# Patient Record
Sex: Male | Born: 1970 | Race: White | Hispanic: No | Smoking: Former smoker
Health system: Southern US, Community
[De-identification: ages and names within clinical notes are randomized; demographics above are authoritative.]

## PROBLEM LIST (undated history)

## (undated) DIAGNOSIS — F419 Anxiety disorder, unspecified: Secondary | ICD-10-CM

## (undated) DIAGNOSIS — J45909 Unspecified asthma, uncomplicated: Secondary | ICD-10-CM

## (undated) DIAGNOSIS — Z8719 Personal history of other diseases of the digestive system: Secondary | ICD-10-CM

## (undated) DIAGNOSIS — K219 Gastro-esophageal reflux disease without esophagitis: Secondary | ICD-10-CM

## (undated) DIAGNOSIS — M1990 Unspecified osteoarthritis, unspecified site: Secondary | ICD-10-CM

## (undated) DIAGNOSIS — T7840XA Allergy, unspecified, initial encounter: Secondary | ICD-10-CM

## (undated) DIAGNOSIS — F32A Depression, unspecified: Secondary | ICD-10-CM

## (undated) HISTORY — DX: Personal history of other diseases of the digestive system: Z87.19

## (undated) HISTORY — DX: Allergy, unspecified, initial encounter: T78.40XA

## (undated) HISTORY — DX: Unspecified osteoarthritis, unspecified site: M19.90

## (undated) HISTORY — DX: Anxiety disorder, unspecified: F41.9

## (undated) HISTORY — DX: Gastro-esophageal reflux disease without esophagitis: K21.9

## (undated) HISTORY — DX: Unspecified asthma, uncomplicated: J45.909

## (undated) HISTORY — DX: Depression, unspecified: F32.A

---
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
## 2003-06-16 HISTORY — PX: HERNIA REPAIR: SHX51

## 2003-12-19 ENCOUNTER — Encounter: Admission: RE | Admit: 2003-12-19 | Discharge: 2003-12-19 | Payer: Self-pay | Admitting: Otolaryngology

## 2010-06-15 HISTORY — PX: KNEE ARTHROSCOPY: SUR90

## 2010-07-05 ENCOUNTER — Encounter: Payer: Self-pay | Admitting: Internal Medicine

## 2013-02-20 ENCOUNTER — Telehealth: Payer: Self-pay | Admitting: Internal Medicine

## 2013-02-20 NOTE — Telephone Encounter (Signed)
Patient is requesting to be your patient, he was referred to see you by a Doctor at the [facility]

## 2013-02-20 NOTE — Telephone Encounter (Signed)
ok 

## 2013-02-21 NOTE — Telephone Encounter (Signed)
Left message for patient to call back and schedule.

## 2013-02-21 NOTE — Telephone Encounter (Signed)
Patient scheduled 04/06/13 @ 9:30, np forms mailed

## 2013-03-03 ENCOUNTER — Ambulatory Visit (INDEPENDENT_AMBULATORY_CARE_PROVIDER_SITE_OTHER): Payer: BC Managed Care – PPO | Admitting: Internal Medicine

## 2013-03-03 ENCOUNTER — Encounter: Payer: Self-pay | Admitting: Internal Medicine

## 2013-03-03 VITALS — BP 120/72 | HR 78 | Temp 98.8°F | Resp 16 | Ht 71.0 in | Wt 186.0 lb

## 2013-03-03 DIAGNOSIS — J209 Acute bronchitis, unspecified: Secondary | ICD-10-CM

## 2013-03-03 DIAGNOSIS — J309 Allergic rhinitis, unspecified: Secondary | ICD-10-CM | POA: Insufficient documentation

## 2013-03-03 DIAGNOSIS — J4530 Mild persistent asthma, uncomplicated: Secondary | ICD-10-CM | POA: Insufficient documentation

## 2013-03-03 DIAGNOSIS — J45901 Unspecified asthma with (acute) exacerbation: Secondary | ICD-10-CM

## 2013-03-03 MED ORDER — MOMETASONE FURO-FORMOTEROL FUM 200-5 MCG/ACT IN AERO: 2.0000 | INHALATION_SPRAY | Freq: Two times a day (BID) | RESPIRATORY_TRACT | Status: DC

## 2013-03-03 MED ORDER — AZITHROMYCIN 500 MG PO TABS: 500.0000 mg | ORAL_TABLET | Freq: Every day | ORAL | Status: DC

## 2013-03-03 MED ORDER — METHYLPREDNISOLONE ACETATE 80 MG/ML IJ SUSP
120.0000 mg | Freq: Once | INTRAMUSCULAR | Status: AC
  Administered 2013-03-03: 120 mg via INTRAMUSCULAR

## 2013-03-03 MED ORDER — HYDROCODONE-HOMATROPINE 5-1.5 MG/5ML PO SYRP: 5.0000 mL | ORAL_SOLUTION | Freq: Three times a day (TID) | ORAL | Status: DC | PRN

## 2013-03-03 NOTE — Patient Instructions (Signed)
Asthma, Adult Asthma is a condition that affects your lungs. It is characterized by swelling and narrowing of your airways as well as increased mucus production. The narrowing comes from swelling and muscle spasms inside the airways. When this happens, breathing can be difficult and you can have coughing, wheezing, and shortness of breath. Knowing more about asthma can help you manage it better. Asthma cannot be cured, but medicines and lifestyle changes can help control it. Asthma can be a minor problem for some people but if it is not controlled it can lead to a life-threatening asthma attack. Asthma can change over time. It is important to work with your caregiver to manage your asthma symptoms. CAUSES The exact cause of asthma is unknown. Asthma is believed to be caused by inherited (genetic) and environmental exposures. Swelling and redness (inflammation) of the airways occurs in asthma. This can be triggered by allergies, viral lung infections, or irritants in the air. Allergic reactions can cause you to wheeze immediately or several hours after an exposure. Asthma triggers are different for each person. It is important to pay attention and know what triggers your asthma.  Common triggers for asthma attacks include:  Animal dander from the skin, hair, or feathers of animals.  Dust mites contained in house dust.  Cockroaches.  Pollen from trees or grass.  Mold.  Cigarette or tobacco smoke. Smoking cannot be allowed in homes of people with asthma. People with asthma should not smoke and should not be around smokers.  Air pollutants such as dust, household cleaners, hair sprays, aerosol sprays, paint fumes, strong chemicals, or strong odors.  Cold air or weather changes. Cold air may cause inflammation. Winds increase molds and pollens in the air. There is not one best climate for people with asthma.  Strong emotions such as crying or laughing hard.  Stress.  Certain medicines such as  aspirin or beta-blockers.  Sulfites in such foods and drinks as dried fruits and wine.  Infections or inflammatory conditions such as the flu, a cold, or an inflammation of the nasal membranes (rhinitis).  Gastroesophageal reflux disease (GERD). GERD is a condition where stomach acid backs up into your throat (esophagus).  Exercise or strenous activity. Proper pre-exercise medicines allow most people to participate in sports. SYMPTOMS  Feeling short of breath.  Chest tightness or pain.  Difficulty sleeping due to coughing, wheezing, or feeling short of breath.  A whistling or wheezing sound with exhalation.  Coughing or wheezing that is worse when you:  Have a virus (such as a cold or the flu).  Are suffering from allergies.  Are exposed to certain fumes or chemicals.  Exercise. Signs that your asthma is probably getting worse include:   More frequent and bothersome asthma signs and symptoms.  Increasing difficulty breathing. This can be measured by a peak flow meter, which is a simple device used to check how well your lungs are working.  An increasingly frequent need to use a quick-relief inhaler. DIAGNOSIS  The diagnosis of asthma is made by review of your medical history, a physical exam, and possibly from other tests. Lung function studies may help with the diagnosis. TREATMENT  Asthma cannot be cured. However, for the majority of adults, asthma can be controlled with treatment. Besides avoidance of triggers of your asthma, medicines are often required. There are 2 classes of medicine used for asthma treatment: controller medicines (reduce inflammation and symptoms) andreliever or rescue medicines (relieve asthma symptoms during acute attacks). You may require daily   medicines to control your asthma. The most effective long-term controller medicines for asthma are inhaled corticosteroids (blocks inflammation). Other long-term control medicines include:  Leukotriene  receptor antagonists (blocks a pathway of inflammation).  Long-acting beta2-agonists (relaxes the muscles of the airways for at least 12 hours) with an inhaled corticosteroid.  Cromolyn sodium or nedocromil (alters certain inflammatory cells' ability to release chemicals that cause inflammation).  Immunomodulators (alters the immune system to prevent asthma symptoms).  Theophylline (relaxes muscles in the airways). You may also require a short-acting beta2-agonist to relieve asthma symptoms during an acute attack. You should understand what to do during an acute attack. Inhaled medicines are effective when used properly. Read the instructions on how to use your medicines correctly and speak to your caregiver if you have questions. Follow up with your caregiver on a regular basis to make sure your asthma is well-controlled. If your asthma is not well-controlled, if you have been hospitalized for asthma, or if multiple medicines or medium to high doses of inhaled corticosteroids are needed to control your asthma, request a referral to an asthma specialist. HOME CARE INSTRUCTIONS   Take medicines as directed by your caregiver.  Control your home environment in the following ways to help prevent asthma attacks:  Change your heating and air conditioning filter at least once a month.  Place a filter or cheesecloth over your heating and air conditioning vents.  Limit the use of fireplaces and wood stoves.  Do not smoke. Do not stay in places where others are smoking.  Get rid of pests (such as roaches and mice) and their droppings.  If you see mold on a plant, throw it away.  Clean your floors and dust every week. Use unscented cleaning products. Use a vacuum cleaner with a HEPA filter if possible. If vacuuming or cleaning triggers your asthma, try to find someone else to do these chores.  Floors in your house should be wood, tile, or vinyl. Carpet can trap dander and dust.  Use  allergy-proof pillows, mattress covers, and box spring covers.  Wash bedsheets and blankets every week in hot water and dry in a dryer.  Use a blanket that is made of polyester or cotton with a tight nap.  Do not use a dust ruffle on your bed.  Clean bathrooms and kitchens with bleach and repaint with mold-resistant paint.  Wash hands frequently.  Talk to your caregiver about an action plan for managing asthma attacks. This includes the use of a peak flow meter which measures the severity of the attack and medicines that can help stop the attack. An action plan can help minimize or stop the attack without having to seek medical care.  Remain calm during an asthma attack.  Always have a plan prepared for seeking medical attention. This should include contacting your caregiver and in the case of a severe attack, calling your local emergency services (911 in [place] SEEK MEDICAL CARE IF:   You have wheezing, shortness of breath, or a cough even if taking medicine to prevent attacks.  You have thickening of sputum.  Your sputum changes from clear or white to yellow, green, gray, or bloody.  You have any problems that may be related to the medicines you are taking (such as a rash, itching, swelling, or trouble breathing).  You are using a reliever medicine more than 2 3 times per week.  Your peak flow is still at 50 79% of personal best after following your action plan for 1   hour. SEEK IMMEDIATE MEDICAL CARE IF:   You are short of breath even at rest.  You get short of breath when doing very little physical activity.  You have difficulty eating, drinking, or talking due to asthma symptoms.  You have chest pain or you feel that your heart is beating fast.  You have a bluish color to your lips or fingernails.  You are lightheaded, dizzy, or faint.  You have a fever or persistent symptoms for more than 2 3 days.  You have a fever and symptoms suddenly get worse.  You seem to be  getting worse and are unresponsive to treatment during an asthma attack.  Your peak flow is less than 50% of personal best. MAKE SURE YOU:   Understand these instructions.  Will watch your condition.  Will get help right away if you are not doing well or get worse. Document Released: 06/01/2005 Document Revised: 05/18/2012 Document Reviewed: 01/18/2008 [employer] Patient Information 2014 [employer].  

## 2013-03-05 ENCOUNTER — Encounter: Payer: Self-pay | Admitting: Internal Medicine

## 2013-03-05 NOTE — Assessment & Plan Note (Signed)
I will treat the infection with a zpak and will control the cough with a suppressant

## 2013-03-05 NOTE — Assessment & Plan Note (Signed)
He was given an injection of depo-medrol to control this flare I have asked him to start using dulera as well

## 2013-03-05 NOTE — Assessment & Plan Note (Signed)
He was given depo-medrol IM to treat this

## 2013-03-05 NOTE — Progress Notes (Signed)
Subjective:    Patient ID: Derek Tucker, male    DOB: 1971-04-02, 42 y.o.   MRN: [id number]  Cough This is a recurrent problem. The current episode started 1 to 4 weeks ago. The problem has been unchanged. The cough is productive of purulent sputum. Associated symptoms include nasal congestion, postnasal drip, rhinorrhea, shortness of breath and wheezing. Pertinent negatives include no chest pain, chills, ear congestion, ear pain, fever, headaches, heartburn, hemoptysis, myalgias, rash, sore throat, sweats or weight loss. He has tried a beta-agonist inhaler for the symptoms. The treatment provided mild relief. His past medical history is significant for asthma.      Review of Systems  Constitutional: Negative.  Negative for fever, chills, weight loss, diaphoresis, activity change, appetite change, fatigue and unexpected weight change.  HENT: Positive for rhinorrhea, sneezing, postnasal drip and sinus pressure. Negative for ear pain, sore throat, facial swelling, trouble swallowing and voice change.   Eyes: Negative.   Respiratory: Positive for cough, shortness of breath, wheezing and stridor. Negative for apnea, hemoptysis, choking and chest tightness.   Cardiovascular: Negative.  Negative for chest pain, palpitations and leg swelling.  Gastrointestinal: Negative.  Negative for heartburn.  Endocrine: Negative.   Genitourinary: Negative.   Musculoskeletal: Negative.  Negative for myalgias.  Skin: Negative.  Negative for rash.  Allergic/Immunologic: Negative.   Neurological: Negative.  Negative for dizziness and headaches.  Hematological: Negative.  Negative for adenopathy. Does not bruise/bleed easily.  Psychiatric/Behavioral: Negative.        Objective:   Physical Exam  Constitutional: He is oriented to person, place, and time. He appears well-developed and well-nourished.  Non-toxic appearance. He does not have a sickly appearance. He does not appear ill. No distress.  HENT:  Head:  Normocephalic and atraumatic.  Right Ear: Hearing, tympanic membrane, external ear and ear canal normal.  Left Ear: Hearing, tympanic membrane, external ear and ear canal normal.  Nose: Mucosal edema and rhinorrhea present. Right sinus exhibits maxillary sinus tenderness. Right sinus exhibits no frontal sinus tenderness. Left sinus exhibits maxillary sinus tenderness. Left sinus exhibits no frontal sinus tenderness.  Mouth/Throat: Oropharynx is clear and moist and mucous membranes are normal. Mucous membranes are not pale, not dry and not cyanotic. No oral lesions. No trismus in the jaw. No edematous. No oropharyngeal exudate, posterior oropharyngeal edema, posterior oropharyngeal erythema or tonsillar abscesses.  Eyes: Conjunctivae are normal. Right eye exhibits no discharge. Left eye exhibits no discharge. No scleral icterus.  Neck: Normal range of motion. Neck supple. No JVD present. No tracheal deviation present. No thyromegaly present.  Cardiovascular: Normal rate, regular rhythm, normal heart sounds and intact distal pulses.  Exam reveals no gallop and no friction rub.   No murmur heard. Pulmonary/Chest: Effort normal and breath sounds normal. No stridor. No respiratory distress. He has no wheezes. He has no rales. He exhibits no tenderness.  Abdominal: Soft. Bowel sounds are normal. He exhibits no distension and no mass. There is no tenderness. There is no rebound and no guarding.  Musculoskeletal: Normal range of motion. He exhibits no edema and no tenderness.  Lymphadenopathy:    He has no cervical adenopathy.  Neurological: He is oriented to person, place, and time.  Skin: Skin is warm and dry. No rash noted. He is not diaphoretic. No erythema. No pallor.  Psychiatric: He has a normal mood and affect. His behavior is normal. Judgment and thought content normal.          Assessment & Plan:

## 2013-04-06 ENCOUNTER — Encounter: Payer: Self-pay | Admitting: Internal Medicine

## 2013-04-06 ENCOUNTER — Ambulatory Visit (INDEPENDENT_AMBULATORY_CARE_PROVIDER_SITE_OTHER): Payer: BC Managed Care – PPO | Admitting: Internal Medicine

## 2013-04-06 VITALS — BP 120/86 | HR 67 | Temp 97.1°F | Resp 16 | Ht 71.0 in | Wt 179.2 lb

## 2013-04-06 DIAGNOSIS — J45909 Unspecified asthma, uncomplicated: Secondary | ICD-10-CM

## 2013-04-06 DIAGNOSIS — Z23 Encounter for immunization: Secondary | ICD-10-CM

## 2013-04-06 DIAGNOSIS — J4530 Mild persistent asthma, uncomplicated: Secondary | ICD-10-CM

## 2013-04-06 DIAGNOSIS — J309 Allergic rhinitis, unspecified: Secondary | ICD-10-CM

## 2013-04-06 MED ORDER — FLUTICASONE PROPIONATE HFA 110 MCG/ACT IN AERO: 1.0000 | INHALATION_SPRAY | Freq: Two times a day (BID) | RESPIRATORY_TRACT | Status: DC

## 2013-04-06 MED ORDER — MONTELUKAST SODIUM 10 MG PO TABS: 10.0000 mg | ORAL_TABLET | Freq: Every day | ORAL | Status: DC

## 2013-04-06 MED ORDER — FLUTICASONE PROPIONATE 50 MCG/ACT NA SUSP: 4.0000 | Freq: Every day | NASAL | Status: AC

## 2013-04-06 MED ORDER — ALBUTEROL SULFATE HFA 108 (90 BASE) MCG/ACT IN AERS: 2.0000 | INHALATION_SPRAY | Freq: Four times a day (QID) | RESPIRATORY_TRACT | Status: DC | PRN

## 2013-04-06 MED ORDER — SPACER/AERO CHAMBER MOUTHPIECE MISC: 1.0000 | Freq: Two times a day (BID) | Status: AC

## 2013-04-06 NOTE — Patient Instructions (Signed)
Asthma, Adult Asthma is a condition that affects your lungs. It is characterized by swelling and narrowing of your airways as well as increased mucus production. The narrowing comes from swelling and muscle spasms inside the airways. When this happens, breathing can be difficult and you can have coughing, wheezing, and shortness of breath. Knowing more about asthma can help you manage it better. Asthma cannot be cured, but medicines and lifestyle changes can help control it. Asthma can be a minor problem for some people but if it is not controlled it can lead to a life-threatening asthma attack. Asthma can change over time. It is important to work with your caregiver to manage your asthma symptoms. CAUSES The exact cause of asthma is unknown. Asthma is believed to be caused by inherited (genetic) and environmental exposures. Swelling and redness (inflammation) of the airways occurs in asthma. This can be triggered by allergies, viral lung infections, or irritants in the air. Allergic reactions can cause you to wheeze immediately or several hours after an exposure. Asthma triggers are different for each person. It is important to pay attention and know what triggers your asthma.  Common triggers for asthma attacks include:  Animal dander from the skin, hair, or feathers of animals.  Dust mites contained in house dust.  Cockroaches.  Pollen from trees or grass.  Mold.  Cigarette or tobacco smoke. Smoking cannot be allowed in homes of people with asthma. People with asthma should not smoke and should not be around smokers.  Air pollutants such as dust, household cleaners, hair sprays, aerosol sprays, paint fumes, strong chemicals, or strong odors.  Cold air or weather changes. Cold air may cause inflammation. Winds increase molds and pollens in the air. There is not one best climate for people with asthma.  Strong emotions such as crying or laughing hard.  Stress.  Certain medicines such as  aspirin or beta-blockers.  Sulfites in such foods and drinks as dried fruits and wine.  Infections or inflammatory conditions such as the flu, a cold, or an inflammation of the nasal membranes (rhinitis).  Gastroesophageal reflux disease (GERD). GERD is a condition where stomach acid backs up into your throat (esophagus).  Exercise or strenous activity. Proper pre-exercise medicines allow most people to participate in sports. SYMPTOMS  Feeling short of breath.  Chest tightness or pain.  Difficulty sleeping due to coughing, wheezing, or feeling short of breath.  A whistling or wheezing sound with exhalation.  Coughing or wheezing that is worse when you:  Have a virus (such as a cold or the flu).  Are suffering from allergies.  Are exposed to certain fumes or chemicals.  Exercise. Signs that your asthma is probably getting worse include:   More frequent and bothersome asthma signs and symptoms.  Increasing difficulty breathing. This can be measured by a peak flow meter, which is a simple device used to check how well your lungs are working.  An increasingly frequent need to use a quick-relief inhaler. DIAGNOSIS  The diagnosis of asthma is made by review of your medical history, a physical exam, and possibly from other tests. Lung function studies may help with the diagnosis. TREATMENT  Asthma cannot be cured. However, for the majority of adults, asthma can be controlled with treatment. Besides avoidance of triggers of your asthma, medicines are often required. There are 2 classes of medicine used for asthma treatment: controller medicines (reduce inflammation and symptoms) andreliever or rescue medicines (relieve asthma symptoms during acute attacks). You may require daily   medicines to control your asthma. The most effective long-term controller medicines for asthma are inhaled corticosteroids (blocks inflammation). Other long-term control medicines include:  Leukotriene  receptor antagonists (blocks a pathway of inflammation).  Long-acting beta2-agonists (relaxes the muscles of the airways for at least 12 hours) with an inhaled corticosteroid.  Cromolyn sodium or nedocromil (alters certain inflammatory cells' ability to release chemicals that cause inflammation).  Immunomodulators (alters the immune system to prevent asthma symptoms).  Theophylline (relaxes muscles in the airways). You may also require a short-acting beta2-agonist to relieve asthma symptoms during an acute attack. You should understand what to do during an acute attack. Inhaled medicines are effective when used properly. Read the instructions on how to use your medicines correctly and speak to your caregiver if you have questions. Follow up with your caregiver on a regular basis to make sure your asthma is well-controlled. If your asthma is not well-controlled, if you have been hospitalized for asthma, or if multiple medicines or medium to high doses of inhaled corticosteroids are needed to control your asthma, request a referral to an asthma specialist. HOME CARE INSTRUCTIONS   Take medicines as directed by your caregiver.  Control your home environment in the following ways to help prevent asthma attacks:  Change your heating and air conditioning filter at least once a month.  Place a filter or cheesecloth over your heating and air conditioning vents.  Limit the use of fireplaces and wood stoves.  Do not smoke. Do not stay in places where others are smoking.  Get rid of pests (such as roaches and mice) and their droppings.  If you see mold on a plant, throw it away.  Clean your floors and dust every week. Use unscented cleaning products. Use a vacuum cleaner with a HEPA filter if possible. If vacuuming or cleaning triggers your asthma, try to find someone else to do these chores.  Floors in your house should be wood, tile, or vinyl. Carpet can trap dander and dust.  Use  allergy-proof pillows, mattress covers, and box spring covers.  Wash bedsheets and blankets every week in hot water and dry in a dryer.  Use a blanket that is made of polyester or cotton with a tight nap.  Do not use a dust ruffle on your bed.  Clean bathrooms and kitchens with bleach and repaint with mold-resistant paint.  Wash hands frequently.  Talk to your caregiver about an action plan for managing asthma attacks. This includes the use of a peak flow meter which measures the severity of the attack and medicines that can help stop the attack. An action plan can help minimize or stop the attack without having to seek medical care.  Remain calm during an asthma attack.  Always have a plan prepared for seeking medical attention. This should include contacting your caregiver and in the case of a severe attack, calling your local emergency services (911 in [place] SEEK MEDICAL CARE IF:   You have wheezing, shortness of breath, or a cough even if taking medicine to prevent attacks.  You have thickening of sputum.  Your sputum changes from clear or white to yellow, green, gray, or bloody.  You have any problems that may be related to the medicines you are taking (such as a rash, itching, swelling, or trouble breathing).  You are using a reliever medicine more than 2 3 times per week.  Your peak flow is still at 50 79% of personal best after following your action plan for 1   hour. SEEK IMMEDIATE MEDICAL CARE IF:   You are short of breath even at rest.  You get short of breath when doing very little physical activity.  You have difficulty eating, drinking, or talking due to asthma symptoms.  You have chest pain or you feel that your heart is beating fast.  You have a bluish color to your lips or fingernails.  You are lightheaded, dizzy, or faint.  You have a fever or persistent symptoms for more than 2 3 days.  You have a fever and symptoms suddenly get worse.  You seem to be  getting worse and are unresponsive to treatment during an asthma attack.  Your peak flow is less than 50% of personal best. MAKE SURE YOU:   Understand these instructions.  Will watch your condition.  Will get help right away if you are not doing well or get worse. Document Released: 06/01/2005 Document Revised: 05/18/2012 Document Reviewed: 01/18/2008 [employer] Patient Information 2014 [employer].  

## 2013-04-06 NOTE — Assessment & Plan Note (Signed)
He stopped dulera due to headache I have asked him to start singulair and flovent He will cont albuterol as needed He wants to see an allergist as well

## 2013-04-06 NOTE — Assessment & Plan Note (Signed)
Cont flonase Start singulair Allergy referral at his request

## 2013-04-06 NOTE — Progress Notes (Signed)
Subjective:    Patient ID: Derek Tucker, male    DOB: 06/17/1970, 42 y.o.   MRN: [id number]  Asthma He complains of chest tightness, cough and wheezing. There is no difficulty breathing, frequent throat clearing, hemoptysis, hoarse voice, shortness of breath or sputum production. This is a recurrent problem. The current episode started more than 1 month ago. The problem occurs intermittently. The problem has been unchanged. The cough is non-productive. Associated symptoms include nasal congestion, postnasal drip and rhinorrhea. Pertinent negatives include no appetite change, chest pain, dyspnea on exertion, ear congestion, ear pain, fever, headaches, heartburn, malaise/fatigue, myalgias, orthopnea, PND, sneezing, sore throat, sweats, trouble swallowing or weight loss. His symptoms are aggravated by pollen. His symptoms are alleviated by beta-agonist. He reports minimal improvement on treatment. His past medical history is significant for asthma.      Review of Systems  Constitutional: Negative.  Negative for fever, chills, weight loss, malaise/fatigue, diaphoresis, appetite change and fatigue.  HENT: Positive for congestion, postnasal drip and rhinorrhea. Negative for ear pain, hoarse voice, nosebleeds, sinus pressure, sneezing, sore throat and trouble swallowing.   Eyes: Negative.   Respiratory: Positive for cough and wheezing. Negative for apnea, hemoptysis, sputum production, choking, chest tightness, shortness of breath and stridor.   Cardiovascular: Negative.  Negative for chest pain, dyspnea on exertion, palpitations, leg swelling and PND.  Gastrointestinal: Negative.  Negative for heartburn, nausea, vomiting, abdominal pain, diarrhea, constipation and blood in stool.  Endocrine: Negative.   Genitourinary: Negative.   Musculoskeletal: Negative.  Negative for myalgias.  Skin: Negative.   Allergic/Immunologic: Negative.   Neurological: Negative.  Negative for headaches.  Hematological:  Negative.  Negative for adenopathy. Does not bruise/bleed easily.  Psychiatric/Behavioral: Negative.        Objective:   Physical Exam  Vitals reviewed. Constitutional: He is oriented to person, place, and time. He appears well-developed and well-nourished.  Non-toxic appearance. He does not have a sickly appearance. He does not appear ill. No distress.  HENT:  Nose: Mucosal edema and rhinorrhea present. Right sinus exhibits no maxillary sinus tenderness and no frontal sinus tenderness. Left sinus exhibits no maxillary sinus tenderness and no frontal sinus tenderness.  Mouth/Throat: Oropharynx is clear and moist and mucous membranes are normal. Mucous membranes are not pale, not dry and not cyanotic. No oral lesions. No trismus in the jaw. No uvula swelling. No oropharyngeal exudate, posterior oropharyngeal edema, posterior oropharyngeal erythema or tonsillar abscesses.  Eyes: Conjunctivae are normal. Right eye exhibits no discharge. Left eye exhibits no discharge. No scleral icterus.  Neck: Normal range of motion. Neck supple. No JVD present. No tracheal deviation present. No thyromegaly present.  Cardiovascular: Normal rate, regular rhythm, normal heart sounds and intact distal pulses.  Exam reveals no gallop and no friction rub.   No murmur heard. Pulmonary/Chest: Effort normal. No accessory muscle usage or stridor. Not tachypneic. No respiratory distress. He has no decreased breath sounds. He has wheezes in the right lower field and the left lower field. He has no rhonchi. He has no rales.  Abdominal: Soft. Bowel sounds are normal. He exhibits no distension and no mass. There is no tenderness. There is no rebound and no guarding.  Musculoskeletal: Normal range of motion. He exhibits no edema and no tenderness.  Lymphadenopathy:    He has no cervical adenopathy.  Neurological: He is oriented to person, place, and time.  Skin: Skin is warm and dry. No rash noted. No erythema. No pallor.  Assessment & Plan:

## 2013-05-26 ENCOUNTER — Institutional Professional Consult (permissible substitution): Payer: BC Managed Care – PPO | Admitting: Internal Medicine

## 2013-05-30 ENCOUNTER — Encounter: Payer: Self-pay | Admitting: Internal Medicine

## 2013-08-04 ENCOUNTER — Telehealth: Payer: Self-pay

## 2013-08-04 NOTE — Telephone Encounter (Signed)
Prior authorization form for Ventolin has been submitted to patients plan.

## 2013-08-11 ENCOUNTER — Telehealth: Payer: Self-pay

## 2013-08-11 NOTE — Telephone Encounter (Signed)
Received a message back from patient's insurance stating the prior authorization for Ventolin inhaler has been denied.

## 2014-03-28 ENCOUNTER — Encounter: Payer: Self-pay | Admitting: Internal Medicine

## 2014-03-28 ENCOUNTER — Ambulatory Visit (INDEPENDENT_AMBULATORY_CARE_PROVIDER_SITE_OTHER): Payer: BC Managed Care – PPO | Admitting: Internal Medicine

## 2014-03-28 VITALS — BP 130/78 | HR 87 | Temp 98.1°F | Resp 16 | Ht 71.0 in | Wt 187.0 lb

## 2014-03-28 DIAGNOSIS — R0609 Other forms of dyspnea: Principal | ICD-10-CM

## 2014-03-28 DIAGNOSIS — Z23 Encounter for immunization: Secondary | ICD-10-CM

## 2014-03-28 DIAGNOSIS — R1011 Right upper quadrant pain: Secondary | ICD-10-CM

## 2014-03-28 DIAGNOSIS — Z0000 Encounter for general adult medical examination without abnormal findings: Secondary | ICD-10-CM

## 2014-03-28 DIAGNOSIS — R10811 Right upper quadrant abdominal tenderness: Secondary | ICD-10-CM | POA: Insufficient documentation

## 2014-03-28 DIAGNOSIS — R0600 Dyspnea, unspecified: Secondary | ICD-10-CM

## 2014-03-28 NOTE — Progress Notes (Signed)
Subjective:    Patient ID: Derek Tucker, male    DOB: 20-Sep-1970, 43 y.o.   MRN: [id number]  HPI Comments: He has had intermittent episodes of RUQ abd pain for years, the last episode was about 6 weeks ago. The pain occurs only after eating a meal. There is no N/V. He had an U/S done years ago and he was told that he has gallstones.     Review of Systems  Constitutional: Negative.  Negative for fever, chills, diaphoresis, appetite change and fatigue.  HENT: Negative.   Eyes: Negative.   Respiratory: Negative.  Negative for cough, choking, chest tightness, shortness of breath, wheezing and stridor.   Cardiovascular: Negative.  Negative for chest pain, palpitations and leg swelling.  Gastrointestinal: Positive for abdominal pain. Negative for nausea, vomiting, diarrhea, constipation, blood in stool, abdominal distention, anal bleeding and rectal pain.  Endocrine: Negative.   Genitourinary: Negative.  Negative for decreased urine volume, discharge, scrotal swelling, difficulty urinating and testicular pain.  Musculoskeletal: Negative.   Skin: Negative.   Allergic/Immunologic: Negative.   Neurological: Negative.  Negative for dizziness.  Hematological: Negative.  Negative for adenopathy. Does not bruise/bleed easily.  Psychiatric/Behavioral: Negative.        Objective:   Physical Exam  Vitals reviewed. Constitutional: He is oriented to person, place, and time. He appears well-developed and well-nourished. No distress.  HENT:  Head: Normocephalic and atraumatic.  Mouth/Throat: Oropharynx is clear and moist. No oropharyngeal exudate.  Eyes: Conjunctivae are normal. Right eye exhibits no discharge. Left eye exhibits no discharge. No scleral icterus.  Neck: Normal range of motion. Neck supple. No JVD present. No tracheal deviation present. No thyromegaly present.  Cardiovascular: Normal rate, regular rhythm, normal heart sounds and intact distal pulses.  Exam reveals no gallop and no  friction rub.   No murmur heard. Pulmonary/Chest: Effort normal and breath sounds normal. No stridor. No respiratory distress. He has no wheezes. He has no rales. He exhibits no tenderness.  Abdominal: Soft. Normal appearance and bowel sounds are normal. He exhibits no shifting dullness, no distension, no pulsatile liver, no fluid wave, no abdominal bruit, no ascites, no pulsatile midline mass and no mass. There is no hepatosplenomegaly, splenomegaly or hepatomegaly. There is no tenderness. There is no rigidity, no rebound, no guarding, no CVA tenderness, no tenderness at McBurney's point and negative Murphy's sign. Hernia confirmed negative in the right inguinal area and confirmed negative in the left inguinal area.  Genitourinary: Testes normal and penis normal. Right testis shows no mass, no swelling and no tenderness. Right testis is descended. Left testis shows no mass, no swelling and no tenderness. Left testis is descended. Circumcised. No penile erythema or penile tenderness. No discharge found.  Musculoskeletal: Normal range of motion. He exhibits no edema and no tenderness.  Lymphadenopathy:    He has no cervical adenopathy.       Right: No inguinal adenopathy present.       Left: No inguinal adenopathy present.  Neurological: He is oriented to person, place, and time.  Skin: Skin is warm and dry. No rash noted. He is not diaphoretic. No erythema. No pallor.  Psychiatric: He has a normal mood and affect. His behavior is normal. Judgment and thought content normal.     No results found for this basename: WBC, HGB, HCT, PLT, GLUCOSE, CHOL, TRIG, HDL, LDLDIRECT, LDLCALC, ALT, AST, NA, K, CL, CREATININE, BUN, CO2, TSH, PSA, INR, GLUF, HGBA1C, MICROALBUR       Assessment &  Plan:

## 2014-03-28 NOTE — Patient Instructions (Signed)
Health Maintenance A healthy lifestyle and preventative care can promote health and wellness.  Maintain regular health, dental, and eye exams.  Eat a healthy diet. Foods like vegetables, fruits, whole grains, low-fat dairy products, and lean protein foods contain the nutrients you need and are low in calories. Decrease your intake of foods high in solid fats, added sugars, and salt. Get information about a proper diet from your health care provider, if necessary.  Regular physical exercise is one of the most important things you can do for your health. Most adults should get at least 150 minutes of moderate-intensity exercise (any activity that increases your heart rate and causes you to sweat) each week. In addition, most adults need muscle-strengthening exercises on 2 or more days a week.   Maintain a healthy weight. The body mass index (BMI) is a screening tool to identify possible weight problems. It provides an estimate of body fat based on height and weight. Your health care provider can find your BMI and can help you achieve or maintain a healthy weight. For males 20 years and older:  A BMI below 18.5 is considered underweight.  A BMI of 18.5 to 24.9 is normal.  A BMI of 25 to 29.9 is considered overweight.  A BMI of 30 and above is considered obese.  Maintain normal blood lipids and cholesterol by exercising and minimizing your intake of saturated fat. Eat a balanced diet with plenty of fruits and vegetables. Blood tests for lipids and cholesterol should begin at age 20 and be repeated every 5 years. If your lipid or cholesterol levels are high, you are over age 50, or you are at high risk for heart disease, you may need your cholesterol levels checked more frequently.Ongoing high lipid and cholesterol levels should be treated with medicines if diet and exercise are not working.  If you smoke, find out from your health care provider how to quit. If you do not use tobacco, do not  start.  Lung cancer screening is recommended for adults aged 55-80 years who are at high risk for developing lung cancer because of a history of smoking. A yearly low-dose CT scan of the lungs is recommended for people who have at least a 30-pack-year history of smoking and are current smokers or have quit within the past 15 years. A pack year of smoking is smoking an average of 1 pack of cigarettes a day for 1 year (for example, a 30-pack-year history of smoking could mean smoking 1 pack a day for 30 years or 2 packs a day for 15 years). Yearly screening should continue until the smoker has stopped smoking for at least 15 years. Yearly screening should be stopped for people who develop a health problem that would prevent them from having lung cancer treatment.  If you choose to drink alcohol, do not have more than 2 drinks per day. One drink is considered to be 12 oz (360 mL) of beer, 5 oz (150 mL) of wine, or 1.5 oz (45 mL) of liquor.  Avoid the use of street drugs. Do not share needles with anyone. Ask for help if you need support or instructions about stopping the use of drugs.  High blood pressure causes heart disease and increases the risk of stroke. Blood pressure should be checked at least every 1-2 years. Ongoing high blood pressure should be treated with medicines if weight loss and exercise are not effective.  If you are 45-79 years old, ask your health care provider if   you should take aspirin to prevent heart disease.  Diabetes screening involves taking a blood sample to check your fasting blood sugar level. This should be done once every 3 years after age 45 if you are at a normal weight and without risk factors for diabetes. Testing should be considered at a younger age or be carried out more frequently if you are overweight and have at least 1 risk factor for diabetes.  Colorectal cancer can be detected and often prevented. Most routine colorectal cancer screening begins at the age of 50  and continues through age 75. However, your health care provider may recommend screening at an earlier age if you have risk factors for colon cancer. On a yearly basis, your health care provider may provide home test kits to check for hidden blood in the stool. A small camera at the end of a tube may be used to directly examine the colon (sigmoidoscopy or colonoscopy) to detect the earliest forms of colorectal cancer. Talk to your health care provider about this at age 50 when routine screening begins. A direct exam of the colon should be repeated every 5-10 years through age 75, unless early forms of precancerous polyps or small growths are found.  People who are at an increased risk for hepatitis B should be screened for this virus. You are considered at high risk for hepatitis B if:  You were born in a country where hepatitis B occurs often. Talk with your health care provider about which countries are considered high risk.  Your parents were born in a high-risk country and you have not received a shot to protect against hepatitis B (hepatitis B vaccine).  You have HIV or AIDS.  You use needles to inject street drugs.  You live with, or have sex with, someone who has hepatitis B.  You are a man who has sex with other men (MSM).  You get hemodialysis treatment.  You take certain medicines for conditions like cancer, organ transplantation, and autoimmune conditions.  Hepatitis C blood testing is recommended for all people born from 1945 through 1965 and any individual with known risk factors for hepatitis C.  Healthy men should no longer receive prostate-specific antigen (PSA) blood tests as part of routine cancer screening. Talk to your health care provider about prostate cancer screening.  Testicular cancer screening is not recommended for adolescents or adult males who have no symptoms. Screening includes self-exam, a health care provider exam, and other screening tests. Consult with your  health care provider about any symptoms you have or any concerns you have about testicular cancer.  Practice safe sex. Use condoms and avoid high-risk sexual practices to reduce the spread of sexually transmitted infections (STIs).  You should be screened for STIs, including gonorrhea and chlamydia if:  You are sexually active and are younger than 24 years.  You are older than 24 years, and your health care provider tells you that you are at risk for this type of infection.  Your sexual activity has changed since you were last screened, and you are at an increased risk for chlamydia or gonorrhea. Ask your health care provider if you are at risk.  If you are at risk of being infected with HIV, it is recommended that you take a prescription medicine daily to prevent HIV infection. This is called pre-exposure prophylaxis (PrEP). You are considered at risk if:  You are a man who has sex with other men (MSM).  You are a heterosexual man who   is sexually active with multiple partners.  You take drugs by injection.  You are sexually active with a partner who has HIV.  Talk with your health care provider about whether you are at high risk of being infected with HIV. If you choose to begin PrEP, you should first be tested for HIV. You should then be tested every 3 months for as long as you are taking PrEP.  Use sunscreen. Apply sunscreen liberally and repeatedly throughout the day. You should seek shade when your shadow is shorter than you. Protect yourself by wearing long sleeves, pants, a wide-brimmed hat, and sunglasses year round whenever you are outdoors.  Tell your health care provider of new moles or changes in moles, especially if there is a change in shape or color. Also, tell your health care provider if a mole is larger than the size of a pencil eraser.  A one-time screening for abdominal aortic aneurysm (AAA) and surgical repair of large AAAs by ultrasound is recommended for men aged  110-75 years who are current or former smokers.  Stay current with your vaccines (immunizations). Document Released: 11/28/2007 Document Revised: 06/06/2013 Document Reviewed: 10/27/2010 [facility] Patient Information 2015 [place], [place]. This information is not intended to replace advice given to you by your health care provider. Make sure you discuss any questions you have with your health care provider. Abdominal Pain Many things can cause abdominal pain. Usually, abdominal pain is not caused by a disease and will improve without treatment. It can often be observed and treated at home. Your health care provider will do a physical exam and possibly order blood tests and X-rays to help determine the seriousness of your pain. However, in many cases, more time must pass before a clear cause of the pain can be found. Before that point, your health care provider may not know if you need more testing or further treatment. HOME CARE INSTRUCTIONS  Monitor your abdominal pain for any changes. The following actions may help to alleviate any discomfort you are experiencing:  Only take over-the-counter or prescription medicines as directed by your health care provider.  Do not take laxatives unless directed to do so by your health care provider.  Try a clear liquid diet (broth, tea, or water) as directed by your health care provider. Slowly move to a bland diet as tolerated. SEEK MEDICAL CARE IF:  You have unexplained abdominal pain.  You have abdominal pain associated with nausea or diarrhea.  You have pain when you urinate or have a bowel movement.  You experience abdominal pain that wakes you in the night.  You have abdominal pain that is worsened or improved by eating food.  You have abdominal pain that is worsened with eating fatty foods.  You have a fever. SEEK IMMEDIATE MEDICAL CARE IF:   Your pain does not go away within 2 hours.  You keep throwing up (vomiting).  Your pain is felt  only in portions of the abdomen, such as the right side or the left lower portion of the abdomen.  You pass bloody or black tarry stools. MAKE SURE YOU:  Understand these instructions.   Will watch your condition.   Will get help right away if you are not doing well or get worse.  Document Released: 03/11/2005 Document Revised: 06/06/2013 Document Reviewed: 02/08/2013 [facility] Patient Information 2015 [place], [place]. This information is not intended to replace advice given to you by your health care provider. Make sure you discuss any questions you have with  your health care provider.

## 2014-03-29 NOTE — Assessment & Plan Note (Signed)
I will recheck an U/S to confirm that there are gallstones I think he will need to see a general surgeon

## 2014-03-29 NOTE — Assessment & Plan Note (Signed)
Exam done Vaccines were updated Labs ordered EKG is normal Pt ed material was given 

## 2014-04-05 ENCOUNTER — Encounter: Payer: Self-pay | Admitting: Internal Medicine

## 2014-05-28 ENCOUNTER — Encounter: Payer: Self-pay | Admitting: Internal Medicine

## 2014-05-28 ENCOUNTER — Other Ambulatory Visit (INDEPENDENT_AMBULATORY_CARE_PROVIDER_SITE_OTHER): Payer: BC Managed Care – PPO

## 2014-05-28 ENCOUNTER — Ambulatory Visit (INDEPENDENT_AMBULATORY_CARE_PROVIDER_SITE_OTHER): Payer: BC Managed Care – PPO | Admitting: Internal Medicine

## 2014-05-28 VITALS — BP 120/70 | HR 79 | Temp 98.2°F | Resp 16 | Ht 71.0 in | Wt 185.0 lb

## 2014-05-28 DIAGNOSIS — Z0000 Encounter for general adult medical examination without abnormal findings: Secondary | ICD-10-CM

## 2014-05-28 DIAGNOSIS — R1011 Right upper quadrant pain: Secondary | ICD-10-CM

## 2014-05-28 DIAGNOSIS — K8020 Calculus of gallbladder without cholecystitis without obstruction: Secondary | ICD-10-CM | POA: Insufficient documentation

## 2014-05-28 LAB — COMPREHENSIVE METABOLIC PANEL
ALBUMIN: 4.6 g/dL (ref 3.5–5.2)
ALK PHOS: 43 U/L (ref 39–117)
ALT: 29 U/L (ref 0–53)
AST: 22 U/L (ref 0–37)
BUN: 11 mg/dL (ref 6–23)
CHLORIDE: 104 meq/L (ref 96–112)
CO2: 25 mEq/L (ref 19–32)
Calcium: 9.5 mg/dL (ref 8.4–10.5)
Creatinine, Ser: 0.9 mg/dL (ref 0.4–1.5)
GFR: 94.23 mL/min (ref >60.00)
Glucose, Bld: 100 mg/dL — ABNORMAL HIGH (ref 70–99)
POTASSIUM: 3.9 meq/L (ref 3.5–5.1)
Sodium: 138 mEq/L (ref 135–145)
Total Bilirubin: 1 mg/dL (ref 0.2–1.2)
Total Protein: 7.8 g/dL (ref 6.0–8.3)

## 2014-05-28 LAB — CBC WITH DIFFERENTIAL/PLATELET
BASOS ABS: 0.1 10*3/uL (ref 0.0–0.1)
BASOS PCT: 0.7 % (ref 0.0–3.0)
EOS ABS: 0.4 10*3/uL (ref 0.0–0.7)
Eosinophils Relative: 4.9 % (ref 0.0–5.0)
HCT: 42.9 % (ref 39.0–52.0)
HEMOGLOBIN: 14.6 g/dL (ref 13.0–17.0)
Lymphocytes Relative: 30.7 % (ref 12.0–46.0)
Lymphs Abs: 2.8 10*3/uL (ref 0.7–4.0)
MCHC: 33.9 g/dL (ref 30.0–36.0)
MCV: 86.5 fl (ref 78.0–100.0)
MONO ABS: 0.9 10*3/uL (ref 0.1–1.0)
Monocytes Relative: 9.4 % (ref 3.0–12.0)
Neutro Abs: 5 10*3/uL (ref 1.4–7.7)
Neutrophils Relative %: 54.3 % (ref 43.0–77.0)
Platelets: 345 10*3/uL (ref 150.0–400.0)
RBC: 4.96 Mil/uL (ref 4.22–5.81)
RDW: 13.5 % (ref 11.5–15.5)
WBC: 9.2 10*3/uL (ref 4.0–10.5)

## 2014-05-28 LAB — TSH: TSH: 2.94 u[IU]/mL (ref 0.35–4.50)

## 2014-05-28 NOTE — Patient Instructions (Signed)
Abdominal Pain  Many things can cause abdominal pain. Usually, abdominal pain is not caused by a disease and will improve without treatment. It can often be observed and treated at home. Your health care provider will do a physical exam and possibly order blood tests and X-rays to help determine the seriousness of your pain. However, in many cases, more time must pass before a clear cause of the pain can be found. Before that point, your health care provider may not know if you need more testing or further treatment.  HOME CARE INSTRUCTIONS   Monitor your abdominal pain for any changes. The following actions may help to alleviate any discomfort you are experiencing:  · Only take over-the-counter or prescription medicines as directed by your health care provider.  · Do not take laxatives unless directed to do so by your health care provider.  · Try a clear liquid diet (broth, tea, or water) as directed by your health care provider. Slowly move to a bland diet as tolerated.  SEEK MEDICAL CARE IF:  · You have unexplained abdominal pain.  · You have abdominal pain associated with nausea or diarrhea.  · You have pain when you urinate or have a bowel movement.  · You experience abdominal pain that wakes you in the night.  · You have abdominal pain that is worsened or improved by eating food.  · You have abdominal pain that is worsened with eating fatty foods.  · You have a fever.  SEEK IMMEDIATE MEDICAL CARE IF:   · Your pain does not go away within 2 hours.  · You keep throwing up (vomiting).  · Your pain is felt only in portions of the abdomen, such as the right side or the left lower portion of the abdomen.  · You pass bloody or black tarry stools.  MAKE SURE YOU:  · Understand these instructions.    · Will watch your condition.    · Will get help right away if you are not doing well or get worse.    Document Released: 03/11/2005 Document Revised: 06/06/2013 Document Reviewed: 02/08/2013  [employer]® Patient Information  ©2015 [employer]. This information is not intended to replace advice given to you by your health care provider. Make sure you discuss any questions you have with your health care provider.  Abdominal Pain  Many things can cause abdominal pain. Usually, abdominal pain is not caused by a disease and will improve without treatment. It can often be observed and treated at home. Your health care provider will do a physical exam and possibly order blood tests and X-rays to help determine the seriousness of your pain. However, in many cases, more time must pass before a clear cause of the pain can be found. Before that point, your health care provider may not know if you need more testing or further treatment.  HOME CARE INSTRUCTIONS   Monitor your abdominal pain for any changes. The following actions may help to alleviate any discomfort you are experiencing:  · Only take over-the-counter or prescription medicines as directed by your health care provider.  · Do not take laxatives unless directed to do so by your health care provider.  · Try a clear liquid diet (broth, tea, or water) as directed by your health care provider. Slowly move to a bland diet as tolerated.  SEEK MEDICAL CARE IF:  · You have unexplained abdominal pain.  · You have abdominal pain associated with nausea or diarrhea.  · You have pain when you   urinate or have a bowel movement.  · You experience abdominal pain that wakes you in the night.  · You have abdominal pain that is worsened or improved by eating food.  · You have abdominal pain that is worsened with eating fatty foods.  · You have a fever.  SEEK IMMEDIATE MEDICAL CARE IF:   · Your pain does not go away within 2 hours.  · You keep throwing up (vomiting).  · Your pain is felt only in portions of the abdomen, such as the right side or the left lower portion of the abdomen.  · You pass bloody or black tarry stools.  MAKE SURE YOU:  · Understand these instructions.    · Will watch your condition.     · Will get help right away if you are not doing well or get worse.    Document Released: 03/11/2005 Document Revised: 06/06/2013 Document Reviewed: 02/08/2013  [employer]® Patient Information ©2015 [employer]. This information is not intended to replace advice given to you by your health care provider. Make sure you discuss any questions you have with your health care provider.

## 2014-05-29 ENCOUNTER — Encounter: Payer: Self-pay | Admitting: Internal Medicine

## 2014-05-29 ENCOUNTER — Other Ambulatory Visit: Payer: Self-pay | Admitting: Internal Medicine

## 2014-05-29 ENCOUNTER — Ambulatory Visit
Admission: RE | Admit: 2014-05-29 | Discharge: 2014-05-29 | Disposition: A | Discharge status: Home / Self Care | Payer: BC Managed Care – PPO | Source: Ambulatory Visit | Attending: Internal Medicine | Admitting: Internal Medicine

## 2014-05-29 DIAGNOSIS — Z0000 Encounter for general adult medical examination without abnormal findings: Secondary | ICD-10-CM

## 2014-05-29 DIAGNOSIS — R1011 Right upper quadrant pain: Secondary | ICD-10-CM

## 2014-05-29 DIAGNOSIS — K8020 Calculus of gallbladder without cholecystitis without obstruction: Secondary | ICD-10-CM

## 2014-05-29 NOTE — Assessment & Plan Note (Signed)
He has a stone lodged in the neck of his GB His exam does not appear to be toxic or acute and his labs are normal I don;t think he needs to be admitted but I have asked him to see surgery asap

## 2014-05-29 NOTE — Assessment & Plan Note (Signed)
U/S and labs indicate that this is symptomatic cholelithiasis Will refer to general surgery

## 2014-05-29 NOTE — Progress Notes (Signed)
Subjective:    Patient ID: Derek Tucker, male    DOB: [birth year], 43 y.o.   MRN: [id number]  Abdominal Pain This is a recurrent problem. The current episode started more than 1 year ago. The onset quality is gradual. The problem occurs intermittently. The problem has been unchanged. The pain is located in the RUQ. The pain is at a severity of 1/10. The pain is mild. The quality of the pain is colicky and aching. The abdominal pain does not radiate. Associated symptoms include nausea. Pertinent negatives include no anorexia, arthralgias, belching, constipation, diarrhea, dysuria, fever, flatus, frequency, headaches, hematochezia, hematuria, melena, myalgias, vomiting or weight loss. The pain is relieved by eating. He has tried nothing for the symptoms. His past medical history is significant for gallstones. There is no history of abdominal surgery, colon cancer, Crohn's disease, GERD, irritable bowel syndrome, pancreatitis, PUD or ulcerative colitis.      Review of Systems  Constitutional: Negative.  Negative for fever, chills, weight loss, diaphoresis, appetite change and fatigue.  HENT: Negative.   Eyes: Negative.   Respiratory: Negative.  Negative for cough, choking, chest tightness, shortness of breath and stridor.   Cardiovascular: Negative.  Negative for chest pain, palpitations and leg swelling.  Gastrointestinal: Positive for nausea and abdominal pain. Negative for vomiting, diarrhea, constipation, melena, hematochezia, abdominal distention, anal bleeding, rectal pain, anorexia and flatus.  Endocrine: Negative.   Genitourinary: Negative.  Negative for dysuria, frequency and hematuria.  Musculoskeletal: Negative.  Negative for myalgias and arthralgias.  Skin: Negative.   Allergic/Immunologic: Negative.   Neurological: Negative.  Negative for headaches.  Hematological: Negative.   Psychiatric/Behavioral: Negative.        Objective:   Physical Exam  Constitutional: He is oriented  to person, place, and time. He appears well-developed and well-nourished.  Non-toxic appearance. He does not have a sickly appearance. He does not appear ill. No distress.  HENT:  Head: Normocephalic and atraumatic.  Mouth/Throat: Oropharynx is clear and moist. No oropharyngeal exudate.  Eyes: Conjunctivae are normal. Right eye exhibits no discharge. Left eye exhibits no discharge. No scleral icterus.  Neck: Normal range of motion. Neck supple. No JVD present. No tracheal deviation present. No thyromegaly present.  Cardiovascular: Normal rate, regular rhythm, normal heart sounds and intact distal pulses.  Exam reveals no gallop and no friction rub.   No murmur heard. Pulmonary/Chest: Effort normal and breath sounds normal. No stridor. No respiratory distress. He has no wheezes. He has no rales. He exhibits no tenderness.  Abdominal: Soft. Normal appearance and bowel sounds are normal. He exhibits no shifting dullness, no distension, no pulsatile liver, no fluid wave, no abdominal bruit, no ascites, no pulsatile midline mass and no mass. There is no hepatosplenomegaly, splenomegaly or hepatomegaly. There is tenderness in the right upper quadrant. There is positive Murphy's sign. There is no rigidity, no rebound, no guarding, no CVA tenderness and no tenderness at McBurney's point.  Musculoskeletal: Normal range of motion. He exhibits no edema or tenderness.  Lymphadenopathy:    He has no cervical adenopathy.  Neurological: He is oriented to person, place, and time.  Skin: Skin is warm and dry. No rash noted. He is not diaphoretic. No erythema. No pallor.  Vitals reviewed.    Lab Results  Component Value Date   WBC 9.2 05/28/2014   HGB 14.6 05/28/2014   HCT 42.9 05/28/2014   PLT 345.0 05/28/2014   GLUCOSE 100* 05/28/2014   ALT 29 05/28/2014   AST 22 05/28/2014  NA 138 05/28/2014   K 3.9 05/28/2014   CL 104 05/28/2014   CREATININE 0.9 05/28/2014   BUN 11 05/28/2014   CO2 25 05/28/2014     TSH 2.94 05/28/2014       Assessment & Plan:

## 2014-05-30 DIAGNOSIS — K8020 Calculus of gallbladder without cholecystitis without obstruction: Secondary | ICD-10-CM | POA: Insufficient documentation

## 2014-06-25 ENCOUNTER — Other Ambulatory Visit (INDEPENDENT_AMBULATORY_CARE_PROVIDER_SITE_OTHER): Payer: BLUE CROSS/BLUE SHIELD

## 2014-06-25 ENCOUNTER — Encounter: Payer: Self-pay | Admitting: Internal Medicine

## 2014-06-25 DIAGNOSIS — Z0000 Encounter for general adult medical examination without abnormal findings: Secondary | ICD-10-CM | POA: Diagnosis not present

## 2014-06-25 LAB — IBC PANEL
IRON: 173 ug/dL — AB (ref 42–165)
Saturation Ratios: 56.2 % — ABNORMAL HIGH (ref 20.0–50.0)
TRANSFERRIN: 220 mg/dL (ref 212.0–360.0)

## 2014-06-25 LAB — LIPID PANEL
Cholesterol: 153 mg/dL (ref 0–200)
HDL: 29.8 mg/dL — AB (ref >39.00)
LDL Cholesterol: 85 mg/dL (ref 0–99)
NonHDL: 123.2
Total CHOL/HDL Ratio: 5
Triglycerides: 192 mg/dL — ABNORMAL HIGH (ref 0.0–149.0)
VLDL: 38.4 mg/dL (ref 0.0–40.0)

## 2014-06-25 LAB — VITAMIN D 25 HYDROXY (VIT D DEFICIENCY, FRACTURES): VITD: 36.46 ng/mL (ref 30.00–100.00)

## 2014-06-25 LAB — PSA: PSA: 0.48 ng/mL (ref 0.10–4.00)

## 2014-06-25 LAB — FERRITIN: FERRITIN: 237.1 ng/mL (ref 22.0–322.0)

## 2014-08-14 ENCOUNTER — Other Ambulatory Visit: Payer: Self-pay | Admitting: Internal Medicine

## 2014-08-14 DIAGNOSIS — F411 Generalized anxiety disorder: Secondary | ICD-10-CM

## 2014-08-14 MED ORDER — ALPRAZOLAM 0.25 MG PO TABS: 0.2500 mg | ORAL_TABLET | Freq: Every evening | ORAL | Status: DC | PRN

## 2015-01-27 ENCOUNTER — Encounter: Payer: Self-pay | Admitting: Internal Medicine

## 2015-01-29 ENCOUNTER — Other Ambulatory Visit: Payer: Self-pay | Admitting: Internal Medicine

## 2015-01-29 DIAGNOSIS — H04129 Dry eye syndrome of unspecified lacrimal gland: Secondary | ICD-10-CM | POA: Insufficient documentation

## 2015-02-09 ENCOUNTER — Other Ambulatory Visit: Payer: Self-pay | Admitting: Internal Medicine

## 2015-02-21 ENCOUNTER — Ambulatory Visit (INDEPENDENT_AMBULATORY_CARE_PROVIDER_SITE_OTHER): Payer: BLUE CROSS/BLUE SHIELD | Admitting: Sports Medicine

## 2015-02-21 ENCOUNTER — Encounter: Payer: Self-pay | Admitting: Internal Medicine

## 2015-02-21 ENCOUNTER — Encounter: Payer: Self-pay | Admitting: Sports Medicine

## 2015-02-21 VITALS — BP 125/92 | Ht 71.0 in | Wt 185.0 lb

## 2015-02-21 DIAGNOSIS — M67879 Other specified disorders of synovium and tendon, unspecified ankle and foot: Secondary | ICD-10-CM

## 2015-02-21 DIAGNOSIS — Q667 Congenital pes cavus, unspecified foot: Secondary | ICD-10-CM | POA: Insufficient documentation

## 2015-02-21 DIAGNOSIS — M7660 Achilles tendinitis, unspecified leg: Secondary | ICD-10-CM | POA: Insufficient documentation

## 2015-02-21 DIAGNOSIS — M216X9 Other acquired deformities of unspecified foot: Secondary | ICD-10-CM | POA: Diagnosis not present

## 2015-02-21 NOTE — Patient Instructions (Signed)
It was a pleasure seeing you today in our clinic. Today we discussed your ankle pain. Here is the treatment plan we have discussed and agreed upon together:   - Today we supplied you with some temporary arch and heel support.  - We ask that you work on stretching and strengthening this region of both feet every day with the exercises you have been performing at home.  - Ice this area as needed for pain. Ibuprofen or tylenol for pain/discomfort. - We would like to see you back in 4-6 weeks for more long-term orthotics.

## 2015-02-21 NOTE — Progress Notes (Signed)
   HPI  CC: Rt Post. heel pain Patient here for 2 month hx of heel pain. Pain initiated after stepping on uneven ground outside. He describes inversion of the Rt ankle and pain/warmth in the heel. Denies significant edema, erythema, or ecchymosis. Since that time he has had increased pain primarily with inclines. He denies feelings of instability of the ankle. Ambulation has not been inhibited.   Note he had switched to running in 0 drop shoes.  Sister required orthotics for high cavus foot and AT probs  Review of Systems   See HPI for ROS. All other systems reviewed and are negative.  Objective: BP 125/92 mmHg  Ht 5\' 11"  (1.803 m)  Wt 185 lb (83.915 kg)  BMI 25.81 kg/m2 Gen: NAD, alert, cooperative, and pleasant Ankle Exam: No visible erythema or swelling. Significant pes cavus appearance bilaterally. Dorsiflexion is limited - really can only get to neutral and cannoot do a heel drop on step, w/ some discomfort at the insertion of the achilles tendon w/ stretching, otherwise ROM intact. Strength is 5/5 in all directions. Stable lateral and medial ligaments; squeeze test and kleiger test unremarkable; Talar dome nontender; No pain at base of 5th MT; No tenderness over cuboid; No tenderness over N spot or navicular prominence. No tenderness on posterior aspects of lateral and medial malleolus. Able to walk w/o issues.   Running gait is supinated but midfoot strike  Neuro: Alert and oriented, Speech clear, No gross deficits  Ultrasound: Views of the Rt and Lt achilles tendon obtained. Rt tendon measured at high range of normal, but was noted to be thickened when compared to the left. Rt tendon noted to have evidence of calcification at the insertion point. No tendonitis or bursal inflammation appreciated. No neo vessels on doppler.  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 US revealed evidence of calcification of  the insertion of the achilles tendon -- indicating likely previous tearing. No evidence of current tearing or bursal involvement.  - Patient provided temporary arch support and heel lifts - Patient had been performing stretches and exercises his sister had received for her similar ailment. He is to continue this. - Patient asked to f/u in 4-6 wks for long-term orthotic.  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 Sister suffers from similar issues. This likely had strong contribution to Achilles pain (primarily the chronic thickening of the Rt achilles).  - Instruction on proper footwear was provided. - Exercises as above. - f/u for orthotics.    Elberta Leatherwood, MD,MS,  PGY2 02/21/2015 2:01 PM  Agree with documentation.  Ila Mcgill, MD

## 2015-02-21 NOTE — Assessment & Plan Note (Addendum)
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 Sister suffers from similar issues. This likely had strong contribution to Achilles pain (primarily the chronic thickening of the Rt achilles).  - Instruction on proper footwear was provided. - Exercises as above. - f/u for orthotics.

## 2015-02-21 NOTE — Assessment & Plan Note (Addendum)
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 US revealed evidence of calcification of the insertion of the achilles tendon -- indicating likely previous tearing. No evidence of current tearing or bursal involvement.  - Patient provided temporary arch support and heel lifts - Patient had been performing stretches and exercises his sister had received for her similar ailment. He is to continue this. - Patient asked to f/u in 4-6 wks for long-term orthotic.  Running was much more comfortable once we addes sports insoles with heellift and scaphoid pads

## 2015-02-28 ENCOUNTER — Ambulatory Visit (INDEPENDENT_AMBULATORY_CARE_PROVIDER_SITE_OTHER): Payer: BLUE CROSS/BLUE SHIELD | Admitting: Internal Medicine

## 2015-02-28 ENCOUNTER — Encounter: Payer: Self-pay | Admitting: Internal Medicine

## 2015-02-28 DIAGNOSIS — Z01818 Encounter for other preprocedural examination: Secondary | ICD-10-CM | POA: Diagnosis not present

## 2015-02-28 DIAGNOSIS — Z01810 Encounter for preprocedural cardiovascular examination: Secondary | ICD-10-CM

## 2015-02-28 NOTE — Progress Notes (Signed)
Pre visit review using our clinic review tool, if applicable. No additional management support is needed unless otherwise documented below in the visit note. 

## 2015-03-01 NOTE — Progress Notes (Signed)
Subjective:  Patient ID: Derek Tucker, male    DOB: 1970/10/02  Age: 44 y.o. MRN: [id number]  CC: Pre-op Exam   HPI Derek Tucker presents for an EKG. He is having eyelid surgery in the next few weeks in [place]. The surgeon is requesting a preop EKG. He feels well and offers no complaints.  Outpatient Prescriptions Prior to Visit  Medication Sig Dispense Refill  . albuterol (PROVENTIL HFA;VENTOLIN HFA) 108 (90 BASE) MCG/ACT inhaler Inhale 2 puffs into the lungs every 6 (six) hours as needed for wheezing. 18 g 11  . ALPRAZolam (XANAX) 0.25 MG tablet TAKE ONE TABLET BY MOUTH AT BEDTIME AS NEEDED FOR ANXIETY 8 tablet 0  . fish oil-omega-3 fatty acids 1000 MG capsule Take 2 g by mouth daily.    . fluticasone (FLONASE) 50 MCG/ACT nasal spray Place 4 sprays into the nose daily. 16 g 11  . fluticasone (FLOVENT HFA) 110 MCG/ACT inhaler Inhale 1 puff into the lungs 2 (two) times daily. (Patient taking differently: Inhale 1 puff into the lungs 2 (two) times daily as needed. ) 1 Inhaler 12  . Glucosamine Sulfate-MSM (MSM-GLUCOSAMINE PO) Take by mouth.    . Multiple Vitamin (MULTIVITAMIN) tablet Take 1 tablet by mouth daily.    Marland Kitchen Spacer/Aero Chamber Mouthpiece MISC 1 Act by Does not apply route 2 (two) times daily. 1 each 11   No facility-administered medications prior to visit.    ROS Review of Systems  Constitutional: Negative.  Negative for fever, chills, diaphoresis, appetite change and fatigue.  HENT: Negative.   Eyes: Negative.   Respiratory: Negative.  Negative for cough, choking, chest tightness, shortness of breath and stridor.   Cardiovascular: Negative.  Negative for chest pain, palpitations and leg swelling.  Gastrointestinal: Negative.  Negative for nausea, vomiting, abdominal pain, diarrhea, constipation and blood in stool.  Endocrine: Negative.   Genitourinary: Negative.   Musculoskeletal: Negative.   Skin: Negative.   Allergic/Immunologic: Negative.   Neurological: Negative.   Negative for dizziness and weakness.  Hematological: Negative.  Negative for adenopathy. Does not bruise/bleed easily.  Psychiatric/Behavioral: Negative.     Objective:  BP 124/82 mmHg  Pulse 72  Temp(Src) 98.2 F (36.8 C) (Oral)  Ht 5\' 11"  (1.803 m)  Wt 191 lb (86.637 kg)  BMI 26.65 kg/m2  SpO2 97%  BP Readings from Last 3 Encounters:  02/28/15 124/82  02/21/15 125/92  05/28/14 120/70    Wt Readings from Last 3 Encounters:  02/28/15 191 lb (86.637 kg)  02/21/15 185 lb (83.915 kg)  05/28/14 185 lb (83.915 kg)    Physical Exam  Constitutional: He is oriented to person, place, and time. No distress.  HENT:  Mouth/Throat: Oropharynx is clear and moist. No oropharyngeal exudate.  Eyes: Conjunctivae are normal. Right eye exhibits no discharge. Left eye exhibits no discharge. No scleral icterus.  Neck: Normal range of motion. Neck supple. No JVD present. No tracheal deviation present. No thyromegaly present.  Cardiovascular: Normal rate, regular rhythm, normal heart sounds and intact distal pulses.  Exam reveals no gallop and no friction rub.   No murmur heard. EKG - NSR, normal EKG  Pulmonary/Chest: Effort normal and breath sounds normal. No stridor. No respiratory distress. He has no wheezes. He has no rales. He exhibits no tenderness.  Abdominal: Soft. Bowel sounds are normal. He exhibits no distension and no mass. There is no tenderness. There is no rebound and no guarding.  Musculoskeletal: Normal range of motion. He exhibits no edema or  tenderness.  Lymphadenopathy:    He has no cervical adenopathy.  Neurological: He is oriented to person, place, and time.  Skin: Skin is warm and dry. No rash noted. He is not diaphoretic. No erythema. No pallor.  Vitals reviewed.   Lab Results  Component Value Date   WBC 9.2 05/28/2014   HGB 14.6 05/28/2014   HCT 42.9 05/28/2014   PLT 345.0 05/28/2014   GLUCOSE 100* 05/28/2014   CHOL 153 06/25/2014   TRIG 192.0* 06/25/2014    HDL 29.80* 06/25/2014   LDLCALC 85 06/25/2014   ALT 29 05/28/2014   AST 22 05/28/2014   NA 138 05/28/2014   K 3.9 05/28/2014   CL 104 05/28/2014   CREATININE 0.9 05/28/2014   BUN 11 05/28/2014   CO2 25 05/28/2014   TSH 2.94 05/28/2014   PSA 0.48 06/25/2014    US Abdomen Complete  05/29/2014   CLINICAL DATA:  Right upper quadrant pain with nausea and diarrhea  EXAM: ULTRASOUND ABDOMEN COMPLETE  COMPARISON:  None.  FINDINGS: Gallbladder: The gallbladder is adequately distended. There is an echogenic shadowing stone which appears impacted in the gallbladder neck. This measures 1.4 cm in diameter. There is no gallbladder wall thickening, pericholecystic fluid, or positive sonographic Murphy's sign.  Common bile duct: Diameter: 2.8-4.5 mm. No abnormal intraluminal echoes are demonstrated.  Liver: No focal lesion identified. Within normal limits in parenchymal echogenicity.  IVC: No abnormality visualized.  Pancreas: Visualized portion unremarkable.  Spleen: Size and appearance within normal limits.  Right Kidney: Length: 12.1 cm. Echogenicity within normal limits. No mass or hydronephrosis visualized.  Left Kidney: Length: 11.8 cm. Echogenicity within normal limits. No mass or hydronephrosis visualized.  Abdominal aorta: No aneurysm visualized.  Other findings: No ascites is demonstrated.  IMPRESSION: 1. There is a 1.4 cm gallstone which appears impacted in the gallbladder neck. There is no sonographic evidence of acute cholecystitis. 2. There is no acute abnormality elsewhere within the abdomen.   Electronically Signed   By: David  Martinique   On: 05/29/2014 10:31    Assessment & Plan:   Derek Tucker was seen today for pre-op exam.  Diagnoses and all orders for this visit:  Pre-operative clearance  Pre-operative cardiovascular examination- he has no signs or symptoms that need preop evaluation and his EKG is normal. He will proceed with the cosmetic surgery. -     EKG 12-Lead   I am having Derek Tucker  maintain his fish oil-omega-3 fatty acids, multivitamin, Glucosamine Sulfate-MSM (MSM-GLUCOSAMINE PO), fluticasone, albuterol, Spacer/Aero Chamber Mouthpiece, fluticasone, and ALPRAZolam.  No orders of the defined types were placed in this encounter.     Follow-up: No Follow-up on file.  Scarlette Calico, MD

## 2015-03-12 ENCOUNTER — Other Ambulatory Visit: Payer: Self-pay | Admitting: Internal Medicine

## 2015-04-10 ENCOUNTER — Encounter: Payer: BLUE CROSS/BLUE SHIELD | Admitting: Sports Medicine

## 2015-04-10 ENCOUNTER — Other Ambulatory Visit: Payer: Self-pay | Admitting: Internal Medicine

## 2015-04-12 ENCOUNTER — Ambulatory Visit (INDEPENDENT_AMBULATORY_CARE_PROVIDER_SITE_OTHER): Payer: BLUE CROSS/BLUE SHIELD

## 2015-04-12 DIAGNOSIS — Z23 Encounter for immunization: Secondary | ICD-10-CM | POA: Diagnosis not present

## 2015-04-15 ENCOUNTER — Encounter: Payer: Self-pay | Admitting: Internal Medicine

## 2015-04-15 ENCOUNTER — Ambulatory Visit (INDEPENDENT_AMBULATORY_CARE_PROVIDER_SITE_OTHER): Payer: BLUE CROSS/BLUE SHIELD | Admitting: Internal Medicine

## 2015-04-15 VITALS — BP 118/82 | HR 81 | Temp 98.1°F | Resp 16 | Ht 71.0 in | Wt 193.0 lb

## 2015-04-15 DIAGNOSIS — R1310 Dysphagia, unspecified: Secondary | ICD-10-CM | POA: Diagnosis not present

## 2015-04-15 DIAGNOSIS — K2100 Gastro-esophageal reflux disease with esophagitis, without bleeding: Secondary | ICD-10-CM

## 2015-04-15 DIAGNOSIS — F411 Generalized anxiety disorder: Secondary | ICD-10-CM | POA: Diagnosis not present

## 2015-04-15 MED ORDER — ALPRAZOLAM 0.25 MG PO TABS: ORAL_TABLET | ORAL | Status: DC

## 2015-04-15 MED ORDER — ESOMEPRAZOLE MAGNESIUM 40 MG PO CPDR: 40.0000 mg | DELAYED_RELEASE_CAPSULE | Freq: Every day | ORAL | Status: DC

## 2015-04-15 NOTE — Progress Notes (Signed)
Subjective:  Patient ID: Derek Tucker, male    DOB: 01/17/1971  Age: 44 y.o. MRN: [id number]  CC: Gastroesophageal Reflux   HPI Beau C Massiah presents for worsening dysphagia over the last year. He tells me that about 10 or 12 years ago he had upper endoscopy done and was told that he has erosive esophagitis and a lower esophageal stricture. He was told to take a proton pump inhibitor but he felt like it was too expensive and has not been taking one. He does have heartburn and treats it with Tums. He does not complain of weight loss and in fact he complains of weight gain.  Outpatient Prescriptions Prior to Visit  Medication Sig Dispense Refill  . albuterol (PROVENTIL HFA;VENTOLIN HFA) 108 (90 BASE) MCG/ACT inhaler Inhale 2 puffs into the lungs every 6 (six) hours as needed for wheezing. 18 g 11  . fish oil-omega-3 fatty acids 1000 MG capsule Take 2 g by mouth daily.    . fluticasone (FLONASE) 50 MCG/ACT nasal spray Place 4 sprays into the nose daily. 16 g 11  . fluticasone (FLOVENT HFA) 110 MCG/ACT inhaler Inhale 1 puff into the lungs 2 (two) times daily. (Patient taking differently: Inhale 1 puff into the lungs 2 (two) times daily as needed. ) 1 Inhaler 12  . Glucosamine Sulfate-MSM (MSM-GLUCOSAMINE PO) Take by mouth.    . Multiple Vitamin (MULTIVITAMIN) tablet Take 1 tablet by mouth daily.    Marland Kitchen Spacer/Aero Chamber Mouthpiece MISC 1 Act by Does not apply route 2 (two) times daily. 1 each 11  . ALPRAZolam (XANAX) 0.25 MG tablet TAKE ONE TABLET BY MOUTH AT BEDTIME AS NEEDED FOR ANXIETY 8 tablet 0   No facility-administered medications prior to visit.    ROS Review of Systems  Constitutional: Positive for unexpected weight change. Negative for fever, chills, diaphoresis, activity change, appetite change and fatigue.  HENT: Positive for trouble swallowing. Negative for sore throat and voice change.   Eyes: Negative.   Respiratory: Negative.  Negative for cough, choking, chest tightness,  shortness of breath and stridor.   Cardiovascular: Negative.  Negative for chest pain, palpitations and leg swelling.  Gastrointestinal: Negative.  Negative for nausea, vomiting, abdominal pain, diarrhea, constipation and blood in stool.  Endocrine: Negative.   Genitourinary: Negative.  Negative for difficulty urinating.  Musculoskeletal: Negative.  Negative for myalgias, back pain, joint swelling and arthralgias.  Skin: Negative.  Negative for rash.  Allergic/Immunologic: Negative.   Neurological: Negative.   Hematological: Negative.  Negative for adenopathy. Does not bruise/bleed easily.  Psychiatric/Behavioral: Negative for suicidal ideas, confusion, sleep disturbance, dysphoric mood and decreased concentration. The patient is nervous/anxious.     Objective:  BP 118/82 mmHg  Pulse 81  Temp(Src) 98.1 F (36.7 C) (Oral)  Resp 16  Ht 5\' 11"  (1.803 m)  Wt 193 lb (87.544 kg)  BMI 26.93 kg/m2  SpO2 97%  BP Readings from Last 3 Encounters:  04/15/15 118/82  02/28/15 124/82  02/21/15 125/92    Wt Readings from Last 3 Encounters:  04/15/15 193 lb (87.544 kg)  02/28/15 191 lb (86.637 kg)  02/21/15 185 lb (83.915 kg)    Physical Exam  Constitutional: He is oriented to person, place, and time. He appears well-developed and well-nourished. No distress.  HENT:  Mouth/Throat: Oropharynx is clear and moist. No oropharyngeal exudate.  Eyes: Conjunctivae are normal. Right eye exhibits no discharge. Left eye exhibits no discharge. No scleral icterus.  Neck: Normal range of motion. Neck supple. No  JVD present. No tracheal deviation present. No thyromegaly present.  Cardiovascular: Normal rate, regular rhythm, normal heart sounds and intact distal pulses.  Exam reveals no gallop and no friction rub.   No murmur heard. Pulmonary/Chest: Effort normal and breath sounds normal. No stridor. No respiratory distress. He has no wheezes. He has no rales. He exhibits no tenderness.  Abdominal: Soft.  Bowel sounds are normal. He exhibits no distension and no mass. There is no tenderness. There is no rebound and no guarding.  Musculoskeletal: Normal range of motion. He exhibits no edema or tenderness.  Lymphadenopathy:    He has no cervical adenopathy.  Neurological: He is oriented to person, place, and time.  Skin: Skin is warm and dry. No rash noted. He is not diaphoretic. No erythema. No pallor.  Vitals reviewed.   Lab Results  Component Value Date   WBC 9.2 05/28/2014   HGB 14.6 05/28/2014   HCT 42.9 05/28/2014   PLT 345.0 05/28/2014   GLUCOSE 100* 05/28/2014   CHOL 153 06/25/2014   TRIG 192.0* 06/25/2014   HDL 29.80* 06/25/2014   LDLCALC 85 06/25/2014   ALT 29 05/28/2014   AST 22 05/28/2014   NA 138 05/28/2014   K 3.9 05/28/2014   CL 104 05/28/2014   CREATININE 0.9 05/28/2014   BUN 11 05/28/2014   CO2 25 05/28/2014   TSH 2.94 05/28/2014   PSA 0.48 06/25/2014    US Abdomen Complete  05/29/2014  CLINICAL DATA:  Right upper quadrant pain with nausea and diarrhea EXAM: ULTRASOUND ABDOMEN COMPLETE COMPARISON:  None. FINDINGS: Gallbladder: The gallbladder is adequately distended. There is an echogenic shadowing stone which appears impacted in the gallbladder neck. This measures 1.4 cm in diameter. There is no gallbladder wall thickening, pericholecystic fluid, or positive sonographic Murphy's sign. Common bile duct: Diameter: 2.8-4.5 mm. No abnormal intraluminal echoes are demonstrated. Liver: No focal lesion identified. Within normal limits in parenchymal echogenicity. IVC: No abnormality visualized. Pancreas: Visualized portion unremarkable. Spleen: Size and appearance within normal limits. Right Kidney: Length: 12.1 cm. Echogenicity within normal limits. No mass or hydronephrosis visualized. Left Kidney: Length: 11.8 cm. Echogenicity within normal limits. No mass or hydronephrosis visualized. Abdominal aorta: No aneurysm visualized. Other findings: No ascites is demonstrated.  IMPRESSION: 1. There is a 1.4 cm gallstone which appears impacted in the gallbladder neck. There is no sonographic evidence of acute cholecystitis. 2. There is no acute abnormality elsewhere within the abdomen. Electronically Signed   By: David  Martinique   On: 05/29/2014 10:31    Assessment & Plan:   Shawn Stall was seen today for gastroesophageal reflux.  Diagnoses and all orders for this visit:  GAD (generalized anxiety disorder) - he has anxiety associated with his weekly allergy injections, will continue Xanax as needed. -     ALPRAZolam (XANAX) 0.25 MG tablet; TAKE ONE TABLET BY MOUTH AT BEDTIME AS NEEDED FOR ANXIETY  Gastroesophageal reflux disease with esophagitis- I've asked him to starting a proton pump inhibitor and to see his gastroenterologist for further evaluation. -     Ambulatory referral to Gastroenterology -     esomeprazole (NEXIUM) 40 MG capsule; Take 1 capsule (40 mg total) by mouth daily at 12 noon.  Dysphagia- I think he needs another upper endoscopy so I have referred him back to his original gastroenterologist. -     Ambulatory referral to Gastroenterology -     esomeprazole (NEXIUM) 40 MG capsule; Take 1 capsule (40 mg total) by mouth daily at 12 noon.  I am having Mr. Laughter start on esomeprazole. I am also having him maintain his fish oil-omega-3 fatty acids, multivitamin, Glucosamine Sulfate-MSM (MSM-GLUCOSAMINE PO), fluticasone, albuterol, Spacer/Aero Chamber Mouthpiece, fluticasone, and ALPRAZolam.  Meds ordered this encounter  Medications  . ALPRAZolam (XANAX) 0.25 MG tablet    Sig: TAKE ONE TABLET BY MOUTH AT BEDTIME AS NEEDED FOR ANXIETY    Dispense:  10 tablet    Refill:  5  . esomeprazole (NEXIUM) 40 MG capsule    Sig: Take 1 capsule (40 mg total) by mouth daily at 12 noon.    Dispense:  90 capsule    Refill:  3     Follow-up: Return if symptoms worsen or fail to improve.  Scarlette Calico, MD

## 2015-04-15 NOTE — Progress Notes (Signed)
Pre visit review using our clinic review tool, if applicable. No additional management support is needed unless otherwise documented below in the visit note. 

## 2015-04-15 NOTE — Patient Instructions (Signed)

## 2015-04-23 ENCOUNTER — Other Ambulatory Visit: Payer: Self-pay | Admitting: Gastroenterology

## 2015-04-23 DIAGNOSIS — R1011 Right upper quadrant pain: Secondary | ICD-10-CM

## 2015-04-23 LAB — CBC AND DIFFERENTIAL
HEMATOCRIT: 44 % (ref 41–53)
Hemoglobin: 15.1 g/dL (ref 13.5–17.5)
PLATELETS: 294 10*3/uL (ref 150–399)
WBC: 7.4 10^3/mL

## 2015-04-23 LAB — BASIC METABOLIC PANEL
BUN: 11 mg/dL (ref 4–21)
Creatinine: 1 mg/dL (ref 0.6–1.3)
GLUCOSE: 99 mg/dL
Potassium: 4.1 mmol/L (ref 3.4–5.3)
Sodium: 141 mmol/L (ref 137–147)

## 2015-04-23 LAB — HEPATIC FUNCTION PANEL
ALK PHOS: 42 U/L (ref 25–125)
ALT: 41 U/L — AB (ref 10–40)
AST: 29 U/L (ref 14–40)

## 2015-05-03 ENCOUNTER — Ambulatory Visit
Admission: RE | Admit: 2015-05-03 | Discharge: 2015-05-03 | Disposition: A | Discharge status: Home / Self Care | Payer: BLUE CROSS/BLUE SHIELD | Source: Ambulatory Visit | Attending: Gastroenterology | Admitting: Gastroenterology

## 2015-05-03 DIAGNOSIS — R1011 Right upper quadrant pain: Secondary | ICD-10-CM

## 2015-05-11 ENCOUNTER — Other Ambulatory Visit: Payer: Self-pay | Admitting: Internal Medicine

## 2015-06-16 HISTORY — PX: CHOLECYSTECTOMY: SHX55

## 2015-09-05 ENCOUNTER — Ambulatory Visit (INDEPENDENT_AMBULATORY_CARE_PROVIDER_SITE_OTHER): Payer: BLUE CROSS/BLUE SHIELD | Admitting: Internal Medicine

## 2015-09-05 ENCOUNTER — Encounter: Payer: Self-pay | Admitting: Internal Medicine

## 2015-09-05 VITALS — BP 122/86 | HR 81 | Temp 98.3°F | Resp 16 | Ht 71.0 in | Wt 189.0 lb

## 2015-09-05 DIAGNOSIS — R519 Headache, unspecified: Secondary | ICD-10-CM

## 2015-09-05 DIAGNOSIS — G43001 Migraine without aura, not intractable, with status migrainosus: Secondary | ICD-10-CM | POA: Diagnosis not present

## 2015-09-05 DIAGNOSIS — R51 Headache: Secondary | ICD-10-CM | POA: Diagnosis not present

## 2015-09-05 DIAGNOSIS — G43009 Migraine without aura, not intractable, without status migrainosus: Secondary | ICD-10-CM | POA: Insufficient documentation

## 2015-09-05 MED ORDER — SUMATRIPTAN-NAPROXEN SODIUM 85-500 MG PO TABS: 1.0000 | ORAL_TABLET | ORAL | Status: DC | PRN

## 2015-09-05 NOTE — Patient Instructions (Signed)

## 2015-09-05 NOTE — Progress Notes (Signed)
Pre visit review using our clinic review tool, if applicable. No additional management support is needed unless otherwise documented below in the visit note. (sarah self, [employer] student  documented this record)  

## 2015-09-06 ENCOUNTER — Telehealth: Payer: Self-pay | Admitting: Internal Medicine

## 2015-09-06 NOTE — Telephone Encounter (Signed)
Derek Tucker called state that Derek Tucker schedule for a MRI on 10/04/15. He was wondering if we can get this move up before 09/11/15 ( they are going out of town and does want [name] to travel with possible aneurysm). Please help, they are welling to go any location if we can get an appt before 09/11/15.   # [name]

## 2015-09-06 NOTE — Telephone Encounter (Signed)
Pt called regarding the MRI orders that were put in yesterday by Dr. Ronnald Ramp.  Have they been worked on yet? Please advise

## 2015-09-07 NOTE — Progress Notes (Signed)
Subjective:  Patient ID: Derek Tucker, male    DOB: 1970-08-04  Age: 45 y.o. MRN: [id number]  CC: Headache   HPI Derek Tucker presents for a 4 day history of headache. He complains of throbbing pain in both temples. He tells me that he has had a diagnosis of migraine for about 7 years. Usually he has a headache about once a year. He has had nausea but no vomiting, he reports photophobia and phonophobia. He usually has an aura with his migraines but he did not have an aura with this particular headache. He has tried tylenol, Motrin, and caffeine with minimal relief from his symptoms. He has a family history of migraines as well as aneurysms.  Outpatient Prescriptions Prior to Visit  Medication Sig Dispense Refill  . albuterol (PROVENTIL HFA;VENTOLIN HFA) 108 (90 BASE) MCG/ACT inhaler Inhale 2 puffs into the lungs every 6 (six) hours as needed for wheezing. 18 g 11  . ALPRAZolam (XANAX) 0.25 MG tablet TAKE ONE TABLET BY MOUTH AT BEDTIME AS NEEDED FOR ANXIETY 10 tablet 5  . fish oil-omega-3 fatty acids 1000 MG capsule Take 2 g by mouth daily.    . fluticasone (FLONASE) 50 MCG/ACT nasal spray Place 4 sprays into the nose daily. 16 g 11  . fluticasone (FLOVENT HFA) 110 MCG/ACT inhaler Inhale 1 puff into the lungs 2 (two) times daily. (Patient taking differently: Inhale 1 puff into the lungs 2 (two) times daily as needed. ) 1 Inhaler 12  . Glucosamine Sulfate-MSM (MSM-GLUCOSAMINE PO) Take by mouth.    . Multiple Vitamin (MULTIVITAMIN) tablet Take 1 tablet by mouth daily.    Marland Kitchen Spacer/Aero Chamber Mouthpiece MISC 1 Act by Does not apply route 2 (two) times daily. 1 each 11  . esomeprazole (NEXIUM) 40 MG capsule Take 1 capsule (40 mg total) by mouth daily at 12 noon. 90 capsule 3   No facility-administered medications prior to visit.    ROS Review of Systems  Constitutional: Negative.  Negative for fever, chills, diaphoresis, appetite change and fatigue.  HENT: Negative for sore throat and trouble  swallowing.   Eyes: Positive for photophobia. Negative for pain, redness and visual disturbance.  Respiratory: Negative.   Cardiovascular: Negative.   Gastrointestinal: Positive for nausea. Negative for vomiting, diarrhea and constipation.  Endocrine: Negative.   Genitourinary: Negative.   Musculoskeletal: Negative.  Negative for back pain and neck pain.  Skin: Negative.  Negative for color change and rash.  Allergic/Immunologic: Negative.   Neurological: Positive for headaches. Negative for dizziness, tremors, seizures, syncope, facial asymmetry, speech difficulty, weakness, light-headedness and numbness.  Hematological: Negative.  Negative for adenopathy. Does not bruise/bleed easily.  Psychiatric/Behavioral: Negative.  Negative for sleep disturbance, dysphoric mood and decreased concentration. The patient is not nervous/anxious.     Objective:  BP 122/86 mmHg  Pulse 81  Temp(Src) 98.3 F (36.8 C) (Oral)  Resp 16  Ht 5\' 11"  (1.803 m)  Wt 189 lb (85.73 kg)  BMI 26.37 kg/m2  SpO2 97%  BP Readings from Last 3 Encounters:  09/05/15 122/86  04/15/15 118/82  02/28/15 124/82    Wt Readings from Last 3 Encounters:  09/05/15 189 lb (85.73 kg)  04/15/15 193 lb (87.544 kg)  02/28/15 191 lb (86.637 kg)    Physical Exam  Constitutional: He is oriented to person, place, and time.  Non-toxic appearance. He does not have a sickly appearance. He does not appear ill. No distress.  HENT:  Head: Normocephalic and atraumatic.  Mouth/Throat: Oropharynx  is clear and moist. No oropharyngeal exudate.  Eyes: Conjunctivae and EOM are normal. Pupils are equal, round, and reactive to light. Right eye exhibits no discharge. Left eye exhibits no discharge. No scleral icterus.  Neck: Normal range of motion. Neck supple. No JVD present. No tracheal deviation present. No Brudzinski's sign and no Kernig's sign noted. No thyromegaly present.  Cardiovascular: Normal rate, regular rhythm, normal heart  sounds and intact distal pulses.  Exam reveals no gallop and no friction rub.   No murmur heard. Pulmonary/Chest: Effort normal and breath sounds normal. No stridor. No respiratory distress. He has no wheezes. He has no rales. He exhibits no tenderness.  Abdominal: Soft. Bowel sounds are normal. He exhibits no distension and no mass. There is no tenderness. There is no rebound and no guarding.  Musculoskeletal: Normal range of motion. He exhibits no edema or tenderness.  Lymphadenopathy:    He has no cervical adenopathy.  Neurological: He is alert and oriented to person, place, and time. He has normal strength. He displays no atrophy, no tremor and normal reflexes. No cranial nerve deficit or sensory deficit. He exhibits normal muscle tone. He displays a negative Romberg sign. He displays no seizure activity. Coordination and gait normal. He displays no Babinski's sign on the right side. He displays no Babinski's sign on the left side.  Reflex Scores:      Tricep reflexes are 1+ on the right side and 1+ on the left side.      Bicep reflexes are 1+ on the right side and 1+ on the left side.      Brachioradialis reflexes are 1+ on the right side and 1+ on the left side.      Patellar reflexes are 2+ on the right side and 2+ on the left side.      Achilles reflexes are 1+ on the right side and 1+ on the left side. Skin: Skin is warm and dry. No rash noted. He is not diaphoretic. No erythema. No pallor.    Lab Results  Component Value Date   WBC 7.4 04/23/2015   HGB 15.1 04/23/2015   HCT 44 04/23/2015   PLT 294 04/23/2015   GLUCOSE 100* 05/28/2014   CHOL 153 06/25/2014   TRIG 192.0* 06/25/2014   HDL 29.80* 06/25/2014   LDLCALC 85 06/25/2014   ALT 41* 04/23/2015   AST 29 04/23/2015   NA 141 04/23/2015   K 4.1 04/23/2015   CL 104 05/28/2014   CREATININE 1.0 04/23/2015   BUN 11 04/23/2015   CO2 25 05/28/2014   TSH 2.94 05/28/2014   PSA 0.48 06/25/2014    US Abdomen  Complete  05/03/2015  CLINICAL DATA:  Right upper quadrant abdominal pain EXAM: ULTRASOUND ABDOMEN COMPLETE COMPARISON:  05/29/2014 FINDINGS: Gallbladder: Surgically absent. Common bile duct: Diameter: 4 mm Liver: Hyperechoic hepatic parenchyma, reflecting hepatic steatosis. 4.9 x 2.9 cm hypoechoic lesion (image 12), likely within the posterior aspect of the medial segment left hepatic lobe, favored to reflect focal fatty sparing. IVC: No abnormality visualized. Pancreas: Incompletely visualized but grossly unremarkable. Spleen: Size and appearance within normal limits. Right Kidney: Length: 10.3 cm.  No mass or hydronephrosis. Left Kidney: Length: 10.6 cm.  No mass or hydronephrosis. Abdominal aorta: No aneurysm visualized. Other findings: None. IMPRESSION: Hepatic steatosis. 4.9 cm cm lesion in the central liver, likely within the posterior aspect of the medial segment left hepatic lobe, favored to reflect focal fatty sparing. Consider MRI abdomen with/without contrast for further characterization as  clinically warranted. Status post cholecystectomy. Electronically Signed   By: Julian Hy M.D.   On: 05/03/2015 09:40    Assessment & Plan:   Shawn Stall was seen today for headache.  Diagnoses and all orders for this visit:  Acute intractable headache, unspecified headache type- he does not appear toxic so I'm not concerned about infection, he has some atypical features but a normal neurologic exam, clearly this HA description is different for him so I have ordered an MRI/MRA to screen for bleed, tumor, mass, aneurysm. In the meantime he will let me know if he develops any new symptoms. -     MR Brain Wo Contrast; Future -     MR MRA Head/Brain Wo Cm; Future  Migraine without aura and with status migrainosus, not intractable- I will treat for migraine with a triptan. -     Discontinue: SUMAtriptan-naproxen (TREXIMET) 85-500 MG tablet; Take 1 tablet by mouth every 2 (two) hours as needed for  migraine. -     SUMAtriptan-naproxen (TREXIMET) 85-500 MG tablet; Take 1 tablet by mouth every 2 (two) hours as needed for migraine.   I have discontinued Mr. Ulrich esomeprazole. I am also having him maintain his fish oil-omega-3 fatty acids, multivitamin, Glucosamine Sulfate-MSM (MSM-GLUCOSAMINE PO), fluticasone, albuterol, Spacer/Aero Chamber Mouthpiece, fluticasone, ALPRAZolam, and SUMAtriptan-naproxen.  Meds ordered this encounter  Medications  . DISCONTD: SUMAtriptan-naproxen (TREXIMET) 85-500 MG tablet    Sig: Take 1 tablet by mouth every 2 (two) hours as needed for migraine.    Dispense:  10 tablet    Refill:  2  . SUMAtriptan-naproxen (TREXIMET) 85-500 MG tablet    Sig: Take 1 tablet by mouth every 2 (two) hours as needed for migraine.    Dispense:  10 tablet    Refill:  2     Follow-up: Return in about 3 weeks (around 09/26/2015).  Scarlette Calico, MD

## 2015-09-09 ENCOUNTER — Telehealth: Payer: Self-pay | Admitting: Internal Medicine

## 2015-09-09 NOTE — Telephone Encounter (Signed)
appt 09/09/15 @ [facility]  @ 930am [place]..records faxed to [phone]..phone number [phone] , pt aware 09/06/15

## 2015-09-09 NOTE — Telephone Encounter (Signed)
Where does he have screws?????

## 2015-09-09 NOTE — Telephone Encounter (Signed)
Imaging called to advise that they were unable to complete the patient's mri today due to the fact that he has screws around the site. The radiologist recommended that he have a CT, but the patient refused due to concerns around radiation

## 2015-09-10 ENCOUNTER — Encounter: Payer: Self-pay | Admitting: Internal Medicine

## 2015-09-10 ENCOUNTER — Ambulatory Visit
Admission: RE | Admit: 2015-09-10 | Discharge: 2015-09-10 | Disposition: A | Discharge status: Home / Self Care | Payer: BLUE CROSS/BLUE SHIELD | Source: Ambulatory Visit | Attending: Internal Medicine | Admitting: Internal Medicine

## 2015-09-10 DIAGNOSIS — R51 Headache: Principal | ICD-10-CM

## 2015-09-10 DIAGNOSIS — R519 Headache, unspecified: Secondary | ICD-10-CM

## 2015-09-18 ENCOUNTER — Other Ambulatory Visit: Payer: BLUE CROSS/BLUE SHIELD

## 2015-09-23 ENCOUNTER — Telehealth: Payer: Self-pay

## 2015-09-23 NOTE — Telephone Encounter (Signed)
PA initiated via CoverMyMeds Key [id number]

## 2015-09-24 NOTE — Telephone Encounter (Signed)
PA APPROVED through 06/14/2038

## 2015-10-04 ENCOUNTER — Other Ambulatory Visit: Payer: BLUE CROSS/BLUE SHIELD

## 2015-10-25 ENCOUNTER — Other Ambulatory Visit: Payer: Self-pay | Admitting: Internal Medicine

## 2015-10-25 NOTE — Telephone Encounter (Signed)
Ok to fill pcp out of office?

## 2015-10-25 NOTE — Telephone Encounter (Signed)
faxed

## 2015-11-24 ENCOUNTER — Other Ambulatory Visit: Payer: Self-pay | Admitting: Family

## 2016-01-02 ENCOUNTER — Other Ambulatory Visit: Payer: Self-pay | Admitting: Internal Medicine

## 2016-01-02 NOTE — Telephone Encounter (Signed)
rx faxed

## 2016-01-15 ENCOUNTER — Other Ambulatory Visit: Payer: Self-pay | Admitting: Family

## 2016-01-29 ENCOUNTER — Other Ambulatory Visit: Payer: Self-pay | Admitting: Internal Medicine

## 2016-01-31 MED ORDER — ALPRAZOLAM 0.25 MG PO TABS: 0.2500 mg | ORAL_TABLET | Freq: Two times a day (BID) | ORAL | 5 refills | Status: DC | PRN

## 2016-02-03 NOTE — Telephone Encounter (Signed)
Faxed script back to [facility]/[place]...Johny Chess

## 2016-03-23 DIAGNOSIS — M546 Pain in thoracic spine: Secondary | ICD-10-CM | POA: Insufficient documentation

## 2016-03-31 ENCOUNTER — Ambulatory Visit (INDEPENDENT_AMBULATORY_CARE_PROVIDER_SITE_OTHER): Payer: BLUE CROSS/BLUE SHIELD

## 2016-03-31 DIAGNOSIS — Z23 Encounter for immunization: Secondary | ICD-10-CM | POA: Diagnosis not present

## 2016-09-02 ENCOUNTER — Other Ambulatory Visit: Payer: Self-pay | Admitting: Internal Medicine

## 2016-10-14 ENCOUNTER — Other Ambulatory Visit: Payer: Self-pay | Admitting: Internal Medicine

## 2016-10-14 NOTE — Telephone Encounter (Signed)
Faxed script back to [facility].../[facility] 

## 2016-10-24 DIAGNOSIS — F4310 Post-traumatic stress disorder, unspecified: Secondary | ICD-10-CM | POA: Diagnosis not present

## 2016-10-31 DIAGNOSIS — F4310 Post-traumatic stress disorder, unspecified: Secondary | ICD-10-CM | POA: Diagnosis not present

## 2016-11-21 DIAGNOSIS — F4310 Post-traumatic stress disorder, unspecified: Secondary | ICD-10-CM | POA: Diagnosis not present

## 2016-12-04 DIAGNOSIS — J3081 Allergic rhinitis due to animal (cat) (dog) hair and dander: Secondary | ICD-10-CM | POA: Diagnosis not present

## 2016-12-04 DIAGNOSIS — J301 Allergic rhinitis due to pollen: Secondary | ICD-10-CM | POA: Diagnosis not present

## 2016-12-07 DIAGNOSIS — J3081 Allergic rhinitis due to animal (cat) (dog) hair and dander: Secondary | ICD-10-CM | POA: Diagnosis not present

## 2016-12-07 DIAGNOSIS — J301 Allergic rhinitis due to pollen: Secondary | ICD-10-CM | POA: Diagnosis not present

## 2016-12-07 DIAGNOSIS — J3089 Other allergic rhinitis: Secondary | ICD-10-CM | POA: Diagnosis not present

## 2016-12-14 DIAGNOSIS — J3081 Allergic rhinitis due to animal (cat) (dog) hair and dander: Secondary | ICD-10-CM | POA: Diagnosis not present

## 2016-12-14 DIAGNOSIS — J301 Allergic rhinitis due to pollen: Secondary | ICD-10-CM | POA: Diagnosis not present

## 2016-12-14 DIAGNOSIS — J3089 Other allergic rhinitis: Secondary | ICD-10-CM | POA: Diagnosis not present

## 2016-12-18 ENCOUNTER — Other Ambulatory Visit (INDEPENDENT_AMBULATORY_CARE_PROVIDER_SITE_OTHER): Payer: 59

## 2016-12-18 ENCOUNTER — Ambulatory Visit (INDEPENDENT_AMBULATORY_CARE_PROVIDER_SITE_OTHER): Payer: 59 | Admitting: Internal Medicine

## 2016-12-18 ENCOUNTER — Encounter: Payer: Self-pay | Admitting: Internal Medicine

## 2016-12-18 VITALS — BP 126/84 | HR 77 | Ht 71.0 in | Wt 197.0 lb

## 2016-12-18 DIAGNOSIS — M545 Low back pain, unspecified: Secondary | ICD-10-CM

## 2016-12-18 DIAGNOSIS — J3081 Allergic rhinitis due to animal (cat) (dog) hair and dander: Secondary | ICD-10-CM | POA: Diagnosis not present

## 2016-12-18 DIAGNOSIS — J3089 Other allergic rhinitis: Secondary | ICD-10-CM | POA: Diagnosis not present

## 2016-12-18 DIAGNOSIS — J301 Allergic rhinitis due to pollen: Secondary | ICD-10-CM | POA: Diagnosis not present

## 2016-12-18 LAB — URINALYSIS, ROUTINE W REFLEX MICROSCOPIC
Bilirubin Urine: NEGATIVE
Ketones, ur: NEGATIVE
LEUKOCYTES UA: NEGATIVE
NITRITE: NEGATIVE
PH: 8 (ref 5.0–8.0)
Specific Gravity, Urine: 1.01 (ref 1.000–1.030)
Total Protein, Urine: NEGATIVE
Urine Glucose: NEGATIVE
Urobilinogen, UA: 0.2 (ref 0.0–1.0)
WBC, UA: NONE SEEN (ref 0–?)

## 2016-12-18 MED ORDER — TRAMADOL HCL 50 MG PO TABS
50.0000 mg | ORAL_TABLET | Freq: Three times a day (TID) | ORAL | 0 refills | Status: DC | PRN
Start: 2016-12-18 — End: 2017-01-19

## 2016-12-18 MED ORDER — CYCLOBENZAPRINE HCL 5 MG PO TABS: 5.0000 mg | ORAL_TABLET | Freq: Three times a day (TID) | ORAL | 1 refills | Status: DC | PRN

## 2016-12-18 NOTE — Assessment & Plan Note (Signed)
Most c/w msk strain, for tramadol prn, muscle relaxer prn, check UA but I doubt UTI or stone in this case,  to f/u any worsening symptoms or concerns

## 2016-12-18 NOTE — Progress Notes (Signed)
Subjective:    Patient ID: Derek Tucker, male    DOB: 26-Apr-1971, 46 y.o.   MRN: [id number]  HPI  Here with acute onset 3 days right lower back pain, mild to mod, sharp, worse to bend or stand up, but no bowel or bladder change, fever, wt loss,  worsening LE pain/numbness/weakness, gait change or falls.  Nothing o/w seems to make better or worse, Cant recall any inciting event.  Denies urinary symptoms such as dysuria, frequency, urgency, flank pain, hematuria or n/v, fever, chills; and has no hx of renal stones but is concerned about renal stone or even UTI  Pt denies chest pain, increased sob or doe, wheezing, orthopnea, PND, increased LE swelling, palpitations, dizziness or syncope. Past Medical History:  Diagnosis Date  . Allergy   . Arthritis   . Asthma    Past Surgical History:  Procedure Laterality Date  . APPENDECTOMY  06/15/1980  . KNEE ARTHROSCOPY  2012    reports that he has quit smoking. He has never used smokeless tobacco. He reports that he drinks about 1.8 oz of alcohol per week . His drug history is not on file. family history includes Arthritis in his mother; Depression in his brother and father; Heart disease in his father; Hypertension in his father and mother. Allergies  Allergen Reactions  . Dulera [Mometasone Furo-Formoterol Fum]     headache   Current Outpatient Prescriptions on File Prior to Visit  Medication Sig Dispense Refill  . albuterol (PROVENTIL HFA;VENTOLIN HFA) 108 (90 BASE) MCG/ACT inhaler Inhale 2 puffs into the lungs every 6 (six) hours as needed for wheezing. 18 g 11  . ALPRAZolam (XANAX) 0.25 MG tablet TAKE ONE TABLET BY MOUTH TWICE DAILY AS NEEDED FOR ANXIETY (Patient taking differently: TAKE ONE TABLET BY MOUTH AS NEEDED FOR ANXIETY) 10 tablet 5  . fish oil-omega-3 fatty acids 1000 MG capsule Take 2 g by mouth daily.    . fluticasone (FLONASE) 50 MCG/ACT nasal spray Place 4 sprays into the nose daily. 16 g 11  . fluticasone (FLOVENT HFA) 110  MCG/ACT inhaler Inhale 1 puff into the lungs 2 (two) times daily. (Patient taking differently: Inhale 1 puff into the lungs 2 (two) times daily as needed. ) 1 Inhaler 12  . Glucosamine Sulfate-MSM (MSM-GLUCOSAMINE PO) Take by mouth.    . Multiple Vitamin (MULTIVITAMIN) tablet Take 1 tablet by mouth daily.    Marland Kitchen Spacer/Aero Chamber Mouthpiece MISC 1 Act by Does not apply route 2 (two) times daily. 1 each 11  . SUMAtriptan-naproxen (TREXIMET) 85-500 MG tablet Take 1 tablet by mouth every 2 (two) hours as needed for migraine. 10 tablet 2   No current facility-administered medications on file prior to visit.    Review of Systems  All other system neg per pt    Objective:   Physical Exam BP 126/84   Pulse 77   Ht 5\' 11"  (1.803 m)   Wt 197 lb (89.4 kg)   SpO2 98%   BMI 27.48 kg/m  VS noted, not il appearing, mild nervous, in pain Constitutional: Pt appears in NAD HENT: Head: NCAT.  Right Ear: External ear normal.  Left Ear: External ear normal.  Eyes: . Pupils are equal, round, and reactive to light. Conjunctivae and EOM are normal Nose: without d/c or deformity Neck: Neck supple. Gross normal ROM Cardiovascular: Normal rate and regular rhythm.   Pulmonary/Chest: Effort normal and breath sounds without rales or wheezing.  Abd:  Soft, NT, ND, + BS,  no organomegaly, no flank tender - benign exam Spine nontender midline throughout + right lower lumbar paravertebral spasm tenderness Neurological: Pt is alert. At baseline orientation, motor grossly intact Skin: Skin is warm. No rashes, other new lesions, no LE edema Psychiatric: Pt behavior is normal without agitation  No other exam findings        Assessment & Plan:

## 2016-12-18 NOTE — Patient Instructions (Signed)
Please take all new medication as prescribed - the pain medication and muscle relaxer if needed  Please continue all other medications as before, and refills have been done if requested.  Please have the pharmacy call with any other refills you may need.  Please keep your appointments with your specialists as you may have planned  Please go to the LAB in the Basement (turn left off the elevator) for the tests to be done today  You will be contacted by phone if any changes need to be made immediately.  Otherwise, you will receive a letter about your results with an explanation, but please check with MyChart first.  Please remember to sign up for MyChart if you have not done so, as this will be important to you in the future with finding out test results, communicating by private email, and scheduling acute appointments online when needed.

## 2016-12-19 DIAGNOSIS — F4310 Post-traumatic stress disorder, unspecified: Secondary | ICD-10-CM | POA: Diagnosis not present

## 2016-12-25 DIAGNOSIS — J301 Allergic rhinitis due to pollen: Secondary | ICD-10-CM | POA: Diagnosis not present

## 2016-12-25 DIAGNOSIS — J3081 Allergic rhinitis due to animal (cat) (dog) hair and dander: Secondary | ICD-10-CM | POA: Diagnosis not present

## 2016-12-25 DIAGNOSIS — J3089 Other allergic rhinitis: Secondary | ICD-10-CM | POA: Diagnosis not present

## 2016-12-28 DIAGNOSIS — J301 Allergic rhinitis due to pollen: Secondary | ICD-10-CM | POA: Diagnosis not present

## 2016-12-28 DIAGNOSIS — J3089 Other allergic rhinitis: Secondary | ICD-10-CM | POA: Diagnosis not present

## 2016-12-28 DIAGNOSIS — J3081 Allergic rhinitis due to animal (cat) (dog) hair and dander: Secondary | ICD-10-CM | POA: Diagnosis not present

## 2017-01-04 DIAGNOSIS — J3081 Allergic rhinitis due to animal (cat) (dog) hair and dander: Secondary | ICD-10-CM | POA: Diagnosis not present

## 2017-01-04 DIAGNOSIS — J3089 Other allergic rhinitis: Secondary | ICD-10-CM | POA: Diagnosis not present

## 2017-01-04 DIAGNOSIS — J301 Allergic rhinitis due to pollen: Secondary | ICD-10-CM | POA: Diagnosis not present

## 2017-01-15 DIAGNOSIS — J3081 Allergic rhinitis due to animal (cat) (dog) hair and dander: Secondary | ICD-10-CM | POA: Diagnosis not present

## 2017-01-15 DIAGNOSIS — J3089 Other allergic rhinitis: Secondary | ICD-10-CM | POA: Diagnosis not present

## 2017-01-15 DIAGNOSIS — J301 Allergic rhinitis due to pollen: Secondary | ICD-10-CM | POA: Diagnosis not present

## 2017-01-19 ENCOUNTER — Ambulatory Visit (INDEPENDENT_AMBULATORY_CARE_PROVIDER_SITE_OTHER): Payer: 59 | Admitting: Internal Medicine

## 2017-01-19 ENCOUNTER — Encounter: Payer: Self-pay | Admitting: Internal Medicine

## 2017-01-19 ENCOUNTER — Other Ambulatory Visit (INDEPENDENT_AMBULATORY_CARE_PROVIDER_SITE_OTHER): Payer: 59

## 2017-01-19 VITALS — BP 128/88 | HR 85 | Temp 98.4°F | Resp 16 | Ht 71.0 in | Wt 195.0 lb

## 2017-01-19 DIAGNOSIS — S22000A Wedge compression fracture of unspecified thoracic vertebra, initial encounter for closed fracture: Secondary | ICD-10-CM | POA: Insufficient documentation

## 2017-01-19 DIAGNOSIS — M545 Low back pain: Secondary | ICD-10-CM | POA: Diagnosis not present

## 2017-01-19 DIAGNOSIS — M546 Pain in thoracic spine: Secondary | ICD-10-CM

## 2017-01-19 DIAGNOSIS — S22000S Wedge compression fracture of unspecified thoracic vertebra, sequela: Secondary | ICD-10-CM | POA: Diagnosis not present

## 2017-01-19 DIAGNOSIS — M5418 Radiculopathy, sacral and sacrococcygeal region: Secondary | ICD-10-CM | POA: Diagnosis not present

## 2017-01-19 DIAGNOSIS — Z0000 Encounter for general adult medical examination without abnormal findings: Secondary | ICD-10-CM

## 2017-01-19 DIAGNOSIS — J4530 Mild persistent asthma, uncomplicated: Secondary | ICD-10-CM

## 2017-01-19 DIAGNOSIS — G8929 Other chronic pain: Secondary | ICD-10-CM

## 2017-01-19 DIAGNOSIS — M25559 Pain in unspecified hip: Secondary | ICD-10-CM

## 2017-01-19 LAB — LIPID PANEL
CHOLESTEROL: 157 mg/dL (ref 0–200)
HDL: 37.1 mg/dL — ABNORMAL LOW (ref >39.00)
NonHDL: 119.92
Total CHOL/HDL Ratio: 4
Triglycerides: 278 mg/dL — ABNORMAL HIGH (ref 0.0–149.0)
VLDL: 55.6 mg/dL — AB (ref 0.0–40.0)

## 2017-01-19 LAB — COMPREHENSIVE METABOLIC PANEL
ALBUMIN: 4.6 g/dL (ref 3.5–5.2)
ALT: 28 U/L (ref 0–53)
AST: 23 U/L (ref 0–37)
Alkaline Phosphatase: 41 U/L (ref 39–117)
BILIRUBIN TOTAL: 1 mg/dL (ref 0.2–1.2)
BUN: 12 mg/dL (ref 6–23)
CALCIUM: 9.5 mg/dL (ref 8.4–10.5)
CHLORIDE: 101 meq/L (ref 96–112)
CO2: 28 mEq/L (ref 19–32)
CREATININE: 0.97 mg/dL (ref 0.40–1.50)
GFR: 87.64 mL/min (ref >60.00)
Glucose, Bld: 125 mg/dL — ABNORMAL HIGH (ref 70–99)
Potassium: 4 mEq/L (ref 3.5–5.1)
SODIUM: 139 meq/L (ref 135–145)
TOTAL PROTEIN: 7.7 g/dL (ref 6.0–8.3)

## 2017-01-19 LAB — CBC WITH DIFFERENTIAL/PLATELET
BASOS PCT: 0.7 % (ref 0.0–3.0)
Basophils Absolute: 0.1 10*3/uL (ref 0.0–0.1)
EOS ABS: 0.3 10*3/uL (ref 0.0–0.7)
Eosinophils Relative: 2.8 % (ref 0.0–5.0)
HEMATOCRIT: 44.8 % (ref 39.0–52.0)
HEMOGLOBIN: 15.4 g/dL (ref 13.0–17.0)
LYMPHS PCT: 20 % (ref 12.0–46.0)
Lymphs Abs: 2 10*3/uL (ref 0.7–4.0)
MCHC: 34.4 g/dL (ref 30.0–36.0)
MCV: 85.6 fl (ref 78.0–100.0)
Monocytes Absolute: 0.7 10*3/uL (ref 0.1–1.0)
Monocytes Relative: 7.2 % (ref 3.0–12.0)
Neutro Abs: 6.8 10*3/uL (ref 1.4–7.7)
Neutrophils Relative %: 69.3 % (ref 43.0–77.0)
PLATELETS: 309 10*3/uL (ref 150.0–400.0)
RBC: 5.24 Mil/uL (ref 4.22–5.81)
RDW: 13.2 % (ref 11.5–15.5)
WBC: 9.8 10*3/uL (ref 4.0–10.5)

## 2017-01-19 LAB — SEDIMENTATION RATE: Sed Rate: 3 mm/hr (ref 0–15)

## 2017-01-19 LAB — LDL CHOLESTEROL, DIRECT: LDL DIRECT: 85 mg/dL

## 2017-01-19 MED ORDER — ALBUTEROL SULFATE HFA 108 (90 BASE) MCG/ACT IN AERS: 2.0000 | INHALATION_SPRAY | Freq: Four times a day (QID) | RESPIRATORY_TRACT | 11 refills | Status: DC | PRN

## 2017-01-19 NOTE — Patient Instructions (Signed)

## 2017-01-19 NOTE — Progress Notes (Signed)
Subjective:  Patient ID: Derek Tucker, male    DOB: 25-Oct-1970  Age: 46 y.o. MRN: [id number]  CC: Annual Exam and Back Pain   HPI Derek Tucker presents for a CPX.   He complains of worsening mid and lower back over the last 10 years. He tells me he had an injury, had x-rays done of his mid thoracic spine and was told that he had a vertebral fracture. He also tells me that he has an uncle that has HLA-B27 positive ankylosing spondylitis and he wants to be screened for that. He describes the back pain as an achy sensation that occasionally radiates into his hips but never into his lower extremities. He's had no episodes of numbness, weakness, tingling in his arms or legs. He takes Motrin for symptom relief.  Outpatient Medications Prior to Visit  Medication Sig Dispense Refill  . ALPRAZolam (XANAX) 0.25 MG tablet TAKE ONE TABLET BY MOUTH TWICE DAILY AS NEEDED FOR ANXIETY (Patient taking differently: TAKE ONE TABLET BY MOUTH AS NEEDED FOR ANXIETY) 10 tablet 5  . fish oil-omega-3 fatty acids 1000 MG capsule Take 2 g by mouth daily.    . fluticasone (FLONASE) 50 MCG/ACT nasal spray Place 4 sprays into the nose daily. 16 g 11  . fluticasone (FLOVENT HFA) 110 MCG/ACT inhaler Inhale 1 puff into the lungs 2 (two) times daily. (Patient taking differently: Inhale 1 puff into the lungs 2 (two) times daily as needed. ) 1 Inhaler 12  . Glucosamine Sulfate-MSM (MSM-GLUCOSAMINE PO) Take by mouth.    . Spacer/Aero Chamber Mouthpiece MISC 1 Act by Does not apply route 2 (two) times daily. 1 each 11  . SUMAtriptan-naproxen (TREXIMET) 85-500 MG tablet Take 1 tablet by mouth every 2 (two) hours as needed for migraine. 10 tablet 2  . albuterol (PROVENTIL HFA;VENTOLIN HFA) 108 (90 BASE) MCG/ACT inhaler Inhale 2 puffs into the lungs every 6 (six) hours as needed for wheezing. 18 g 11  . Multiple Vitamin (MULTIVITAMIN) tablet Take 1 tablet by mouth daily.    . cyclobenzaprine (FLEXERIL) 5 MG tablet Take 1 tablet (5 mg  total) by mouth 3 (three) times daily as needed for muscle spasms. (Patient not taking: Reported on 01/19/2017) 30 tablet 1  . traMADol (ULTRAM) 50 MG tablet Take 1 tablet (50 mg total) by mouth every 8 (eight) hours as needed. (Patient not taking: Reported on 01/19/2017) 30 tablet 0   No facility-administered medications prior to visit.     ROS Review of Systems  Constitutional: Negative.  Negative for appetite change, chills and diaphoresis.  HENT: Negative.  Negative for trouble swallowing.   Eyes: Negative for visual disturbance.  Respiratory: Positive for wheezing (rare wheezing). Negative for cough, chest tightness and shortness of breath.   Cardiovascular: Negative for chest pain, palpitations and leg swelling.  Gastrointestinal: Negative for abdominal pain, blood in stool, constipation, diarrhea, nausea and vomiting.  Endocrine: Negative.   Genitourinary: Negative.  Negative for difficulty urinating, dysuria, hematuria, penile swelling, scrotal swelling, testicular pain and urgency.  Musculoskeletal: Positive for back pain. Negative for arthralgias, joint swelling, myalgias and neck pain.  Skin: Negative.  Negative for rash.  Allergic/Immunologic: Negative.   Neurological: Negative.  Negative for dizziness, weakness, numbness and headaches.  Hematological: Negative for adenopathy. Does not bruise/bleed easily.  Psychiatric/Behavioral: Negative.     Objective:  BP 128/88 (BP Location: Left Arm, Patient Position: Sitting, Cuff Size: Normal)   Pulse 85   Temp 98.4 F (36.9 C) (Oral)  Resp 16   Ht 5' 11"  (1.803 m)   Wt 195 lb (88.5 kg)   SpO2 99%   BMI 27.20 kg/m   BP Readings from Last 3 Encounters:  01/19/17 128/88  12/18/16 126/84  09/05/15 122/86    Wt Readings from Last 3 Encounters:  01/19/17 195 lb (88.5 kg)  12/18/16 197 lb (89.4 kg)  09/05/15 189 lb (85.7 kg)    Physical Exam  Constitutional: He is oriented to person, place, and time. No distress.  HENT:    Mouth/Throat: Oropharynx is clear and moist. No oropharyngeal exudate.  Eyes: Conjunctivae are normal. Right eye exhibits no discharge. Left eye exhibits no discharge. No scleral icterus.  Neck: Normal range of motion. Neck supple. No JVD present. No thyromegaly present.  Cardiovascular: Normal rate, regular rhythm and intact distal pulses.  Exam reveals no gallop and no friction rub.   No murmur heard. Pulmonary/Chest: Effort normal and breath sounds normal. No respiratory distress. He has no wheezes. He has no rales. He exhibits no tenderness.  Abdominal: Soft. Bowel sounds are normal. He exhibits no distension and no mass. There is no tenderness. There is no rebound and no guarding.  Musculoskeletal: Normal range of motion. He exhibits no edema, tenderness or deformity.       Right hip: Normal.       Left hip: Normal.       Lumbar back: Normal. He exhibits normal range of motion, no tenderness, no bony tenderness, no swelling and no edema.  Lymphadenopathy:    He has no cervical adenopathy.  Neurological: He is alert and oriented to person, place, and time. He has normal strength. He displays no atrophy and no tremor. No cranial nerve deficit or sensory deficit. He exhibits normal muscle tone. He displays a negative Romberg sign. He displays no seizure activity. Coordination and gait normal.  Neg SLR in BLE  Skin: Skin is warm and dry. No rash noted. He is not diaphoretic. No erythema. No pallor.  Vitals reviewed.   Lab Results  Component Value Date   WBC 9.8 01/19/2017   HGB 15.4 01/19/2017   HCT 44.8 01/19/2017   PLT 309.0 01/19/2017   GLUCOSE 125 (H) 01/19/2017   CHOL 157 01/19/2017   TRIG 278.0 (H) 01/19/2017   HDL 37.10 (L) 01/19/2017   LDLDIRECT 85.0 01/19/2017   LDLCALC 85 06/25/2014   ALT 28 01/19/2017   AST 23 01/19/2017   NA 139 01/19/2017   K 4.0 01/19/2017   CL 101 01/19/2017   CREATININE 0.97 01/19/2017   BUN 12 01/19/2017   CO2 28 01/19/2017   TSH 2.94  05/28/2014   PSA 0.48 06/25/2014    Mr Brain Wo Contrast  Result Date: 09/10/2015 CLINICAL DATA:  Acute intractable headache beginning 1 week ago. Headache last for 5 days. Symptoms have since resolved. EXAM: MRI HEAD WITHOUT CONTRAST MRA HEAD WITHOUT CONTRAST TECHNIQUE: Multiplanar, multiecho pulse sequences of the brain and surrounding structures were obtained without intravenous contrast. Angiographic images of the head were obtained using MRA technique without contrast. COMPARISON:  Sinus CT 12/19/2003 FINDINGS: MRI HEAD FINDINGS No acute infarct, hemorrhage, or mass lesion is present. The ventricles are of normal size. No significant extraaxial fluid collection is present. Flow is present in the major intracranial arteries. Internal auditory canals are within normal limits bilaterally. Circumferential mucosal thickening is present throughout the paranasal sinuses. The the right sphenoid sinus is clear. No fluid levels are present. The mastoid air cells are clear. The skullbase  is within normal limits. Midline sagittal images are unremarkable. MRA HEAD FINDINGS The internal carotid arteries are within normal limits from the high cervical segments through the ICA termini bilaterally. The A1 and M1 segments are normal. The anterior communicating arteries patent. The MCA bifurcations are intact. ACA and MCA branch vessels are within normal limits. The vertebral arteries are codominant. The left PICA origin is visualized and normal. The right AICA is dominant. The basilar artery is normal. Both posterior cerebral arteries originate from the basilar tip. There is significant contribution from a right posterior communicating artery. The PCA branch vessels are intact. IMPRESSION: 1. Normal MRI appearance the brain. 2. Mild diffuse sinus disease. 3. Normal variant MRA circle of Willis without evidence for significant proximal stenosis, aneurysm, or branch vessel occlusion. Electronically Signed   By: San Morelle M.D.   On: 09/10/2015 11:30   Mr Jodene Nam Head/brain VO Cm  Result Date: 09/10/2015 CLINICAL DATA:  Acute intractable headache beginning 1 week ago. Headache last for 5 days. Symptoms have since resolved. EXAM: MRI HEAD WITHOUT CONTRAST MRA HEAD WITHOUT CONTRAST TECHNIQUE: Multiplanar, multiecho pulse sequences of the brain and surrounding structures were obtained without intravenous contrast. Angiographic images of the head were obtained using MRA technique without contrast. COMPARISON:  Sinus CT 12/19/2003 FINDINGS: MRI HEAD FINDINGS No acute infarct, hemorrhage, or mass lesion is present. The ventricles are of normal size. No significant extraaxial fluid collection is present. Flow is present in the major intracranial arteries. Internal auditory canals are within normal limits bilaterally. Circumferential mucosal thickening is present throughout the paranasal sinuses. The the right sphenoid sinus is clear. No fluid levels are present. The mastoid air cells are clear. The skullbase is within normal limits. Midline sagittal images are unremarkable. MRA HEAD FINDINGS The internal carotid arteries are within normal limits from the high cervical segments through the ICA termini bilaterally. The A1 and M1 segments are normal. The anterior communicating arteries patent. The MCA bifurcations are intact. ACA and MCA branch vessels are within normal limits. The vertebral arteries are codominant. The left PICA origin is visualized and normal. The right AICA is dominant. The basilar artery is normal. Both posterior cerebral arteries originate from the basilar tip. There is significant contribution from a right posterior communicating artery. The PCA branch vessels are intact. IMPRESSION: 1. Normal MRI appearance the brain. 2. Mild diffuse sinus disease. 3. Normal variant MRA circle of Willis without evidence for significant proximal stenosis, aneurysm, or branch vessel occlusion. Electronically Signed   By:  San Morelle M.D.   On: 09/10/2015 11:30    Assessment & Plan:   Derek Tucker was seen today for annual exam and back pain.  Diagnoses and all orders for this visit:  Hip pain, chronic, unspecified laterality- he will undergo an MRI to screen for ankylosing spondylitis and degenerative joint disease. -     Sedimentation rate; Future -     HLA-B27 Antigen  Chronic midline low back pain, with sciatica presence unspecified- will screen for ankylosing spondylitis. His sedimentation rate is negative for any concerns about active inflammation. -     MR Lumbar Spine Wo Contrast; Future -     Sedimentation rate; Future -     HLA-B27 Antigen  Chronic bilateral thoracic back pain- as above -     MR Thoracic Spine Wo Contrast; Future -     Sedimentation rate; Future  Closed wedge fracture of thoracic vertebra, unspecified thoracic vertebral level, sequela- he was not willing to do plain  x-rays today. We'll go ahead and order an MRI to see if indeed he has a thoracic vertebral fracture. -     MR Thoracic Spine Wo Contrast; Future  Radiculopathy, sacral and sacrococcygeal region- as above -     MR SACRUM SI JOINTS W WO CONTRAST; Future  Routine general medical examination at a health care facility- exam completed, labs reviewed, vaccines reviewed, patient education material was given. -     Lipid panel; Future -     Comprehensive metabolic panel; Future -     CBC with Differential/Platelet; Future  Mild persistent asthma without complication -     albuterol (PROVENTIL HFA;VENTOLIN HFA) 108 (90 Base) MCG/ACT inhaler; Inhale 2 puffs into the lungs every 6 (six) hours as needed for wheezing.   I have discontinued Mr. Wedel multivitamin, traMADol, and cyclobenzaprine. I am also having him maintain his fish oil-omega-3 fatty acids, Glucosamine Sulfate-MSM (MSM-GLUCOSAMINE PO), fluticasone, Spacer/Aero Chamber Mouthpiece, fluticasone, SUMAtriptan-naproxen, ALPRAZolam, and albuterol.  Meds  ordered this encounter  Medications  . albuterol (PROVENTIL HFA;VENTOLIN HFA) 108 (90 Base) MCG/ACT inhaler    Sig: Inhale 2 puffs into the lungs every 6 (six) hours as needed for wheezing.    Dispense:  18 g    Refill:  11     Follow-up: Return in about 6 weeks (around 03/02/2017).  Scarlette Calico, MD

## 2017-01-20 ENCOUNTER — Encounter: Payer: Self-pay | Admitting: Internal Medicine

## 2017-01-22 DIAGNOSIS — J3081 Allergic rhinitis due to animal (cat) (dog) hair and dander: Secondary | ICD-10-CM | POA: Diagnosis not present

## 2017-01-22 DIAGNOSIS — J301 Allergic rhinitis due to pollen: Secondary | ICD-10-CM | POA: Diagnosis not present

## 2017-01-22 DIAGNOSIS — J3089 Other allergic rhinitis: Secondary | ICD-10-CM | POA: Diagnosis not present

## 2017-01-23 DIAGNOSIS — F4310 Post-traumatic stress disorder, unspecified: Secondary | ICD-10-CM | POA: Diagnosis not present

## 2017-01-26 DIAGNOSIS — J301 Allergic rhinitis due to pollen: Secondary | ICD-10-CM | POA: Diagnosis not present

## 2017-01-26 DIAGNOSIS — J3081 Allergic rhinitis due to animal (cat) (dog) hair and dander: Secondary | ICD-10-CM | POA: Diagnosis not present

## 2017-01-26 DIAGNOSIS — J3089 Other allergic rhinitis: Secondary | ICD-10-CM | POA: Diagnosis not present

## 2017-01-28 ENCOUNTER — Encounter: Payer: Self-pay | Admitting: Internal Medicine

## 2017-02-01 ENCOUNTER — Ambulatory Visit
Admission: RE | Admit: 2017-02-01 | Discharge: 2017-02-01 | Disposition: A | Discharge status: Home / Self Care | Payer: 59 | Source: Ambulatory Visit | Attending: Internal Medicine | Admitting: Internal Medicine

## 2017-02-01 ENCOUNTER — Encounter: Payer: Self-pay | Admitting: Internal Medicine

## 2017-02-01 DIAGNOSIS — M5418 Radiculopathy, sacral and sacrococcygeal region: Secondary | ICD-10-CM

## 2017-02-01 DIAGNOSIS — S22000S Wedge compression fracture of unspecified thoracic vertebra, sequela: Secondary | ICD-10-CM

## 2017-02-01 DIAGNOSIS — G8929 Other chronic pain: Secondary | ICD-10-CM

## 2017-02-01 DIAGNOSIS — M5124 Other intervertebral disc displacement, thoracic region: Secondary | ICD-10-CM | POA: Diagnosis not present

## 2017-02-01 DIAGNOSIS — M5126 Other intervertebral disc displacement, lumbar region: Secondary | ICD-10-CM | POA: Diagnosis not present

## 2017-02-01 DIAGNOSIS — M546 Pain in thoracic spine: Principal | ICD-10-CM

## 2017-02-01 DIAGNOSIS — M533 Sacrococcygeal disorders, not elsewhere classified: Secondary | ICD-10-CM | POA: Diagnosis not present

## 2017-02-01 DIAGNOSIS — M545 Low back pain: Principal | ICD-10-CM

## 2017-02-01 MED ORDER — GADOBENATE DIMEGLUMINE 529 MG/ML IV SOLN
18.0000 mL | Freq: Once | INTRAVENOUS | Status: AC | PRN
  Administered 2017-02-01: 18 mL via INTRAVENOUS

## 2017-02-02 DIAGNOSIS — J3089 Other allergic rhinitis: Secondary | ICD-10-CM | POA: Diagnosis not present

## 2017-02-02 DIAGNOSIS — J3081 Allergic rhinitis due to animal (cat) (dog) hair and dander: Secondary | ICD-10-CM | POA: Diagnosis not present

## 2017-02-02 DIAGNOSIS — J301 Allergic rhinitis due to pollen: Secondary | ICD-10-CM | POA: Diagnosis not present

## 2017-02-05 ENCOUNTER — Encounter: Payer: Self-pay | Admitting: Internal Medicine

## 2017-02-06 DIAGNOSIS — F4310 Post-traumatic stress disorder, unspecified: Secondary | ICD-10-CM | POA: Diagnosis not present

## 2017-02-08 ENCOUNTER — Other Ambulatory Visit: Payer: 59

## 2017-02-08 ENCOUNTER — Other Ambulatory Visit: Payer: Self-pay | Admitting: Internal Medicine

## 2017-02-08 DIAGNOSIS — M545 Low back pain, unspecified: Secondary | ICD-10-CM

## 2017-02-08 DIAGNOSIS — G8929 Other chronic pain: Secondary | ICD-10-CM | POA: Diagnosis not present

## 2017-02-09 DIAGNOSIS — J3089 Other allergic rhinitis: Secondary | ICD-10-CM | POA: Diagnosis not present

## 2017-02-09 DIAGNOSIS — J3081 Allergic rhinitis due to animal (cat) (dog) hair and dander: Secondary | ICD-10-CM | POA: Diagnosis not present

## 2017-02-09 DIAGNOSIS — J301 Allergic rhinitis due to pollen: Secondary | ICD-10-CM | POA: Diagnosis not present

## 2017-02-15 ENCOUNTER — Encounter: Payer: Self-pay | Admitting: Internal Medicine

## 2017-02-15 LAB — HLA-B27 ANTIGEN: DNA RESULT: NEGATIVE

## 2017-02-20 DIAGNOSIS — F4310 Post-traumatic stress disorder, unspecified: Secondary | ICD-10-CM | POA: Diagnosis not present

## 2017-02-24 DIAGNOSIS — J301 Allergic rhinitis due to pollen: Secondary | ICD-10-CM | POA: Diagnosis not present

## 2017-02-24 DIAGNOSIS — J3089 Other allergic rhinitis: Secondary | ICD-10-CM | POA: Diagnosis not present

## 2017-02-24 DIAGNOSIS — J3081 Allergic rhinitis due to animal (cat) (dog) hair and dander: Secondary | ICD-10-CM | POA: Diagnosis not present

## 2017-03-01 DIAGNOSIS — J301 Allergic rhinitis due to pollen: Secondary | ICD-10-CM | POA: Diagnosis not present

## 2017-03-01 DIAGNOSIS — J3089 Other allergic rhinitis: Secondary | ICD-10-CM | POA: Diagnosis not present

## 2017-03-01 DIAGNOSIS — J3081 Allergic rhinitis due to animal (cat) (dog) hair and dander: Secondary | ICD-10-CM | POA: Diagnosis not present

## 2017-03-06 DIAGNOSIS — F4310 Post-traumatic stress disorder, unspecified: Secondary | ICD-10-CM | POA: Diagnosis not present

## 2017-03-19 DIAGNOSIS — J3081 Allergic rhinitis due to animal (cat) (dog) hair and dander: Secondary | ICD-10-CM | POA: Diagnosis not present

## 2017-03-19 DIAGNOSIS — J301 Allergic rhinitis due to pollen: Secondary | ICD-10-CM | POA: Diagnosis not present

## 2017-03-19 DIAGNOSIS — J3089 Other allergic rhinitis: Secondary | ICD-10-CM | POA: Diagnosis not present

## 2017-03-20 DIAGNOSIS — F4310 Post-traumatic stress disorder, unspecified: Secondary | ICD-10-CM | POA: Diagnosis not present

## 2017-03-20 DIAGNOSIS — M7741 Metatarsalgia, right foot: Secondary | ICD-10-CM | POA: Diagnosis not present

## 2017-03-20 DIAGNOSIS — M25571 Pain in right ankle and joints of right foot: Secondary | ICD-10-CM | POA: Diagnosis not present

## 2017-03-22 DIAGNOSIS — J3089 Other allergic rhinitis: Secondary | ICD-10-CM | POA: Diagnosis not present

## 2017-03-22 DIAGNOSIS — J4530 Mild persistent asthma, uncomplicated: Secondary | ICD-10-CM | POA: Diagnosis not present

## 2017-03-22 DIAGNOSIS — J3081 Allergic rhinitis due to animal (cat) (dog) hair and dander: Secondary | ICD-10-CM | POA: Diagnosis not present

## 2017-03-22 DIAGNOSIS — J301 Allergic rhinitis due to pollen: Secondary | ICD-10-CM | POA: Diagnosis not present

## 2017-03-29 DIAGNOSIS — J301 Allergic rhinitis due to pollen: Secondary | ICD-10-CM | POA: Diagnosis not present

## 2017-03-29 DIAGNOSIS — J3081 Allergic rhinitis due to animal (cat) (dog) hair and dander: Secondary | ICD-10-CM | POA: Diagnosis not present

## 2017-03-30 DIAGNOSIS — J3089 Other allergic rhinitis: Secondary | ICD-10-CM | POA: Diagnosis not present

## 2017-03-30 DIAGNOSIS — J3081 Allergic rhinitis due to animal (cat) (dog) hair and dander: Secondary | ICD-10-CM | POA: Diagnosis not present

## 2017-03-30 DIAGNOSIS — J301 Allergic rhinitis due to pollen: Secondary | ICD-10-CM | POA: Diagnosis not present

## 2017-04-03 DIAGNOSIS — F4310 Post-traumatic stress disorder, unspecified: Secondary | ICD-10-CM | POA: Diagnosis not present

## 2017-04-06 DIAGNOSIS — J3081 Allergic rhinitis due to animal (cat) (dog) hair and dander: Secondary | ICD-10-CM | POA: Diagnosis not present

## 2017-04-06 DIAGNOSIS — J3089 Other allergic rhinitis: Secondary | ICD-10-CM | POA: Diagnosis not present

## 2017-04-06 DIAGNOSIS — J301 Allergic rhinitis due to pollen: Secondary | ICD-10-CM | POA: Diagnosis not present

## 2017-04-15 DIAGNOSIS — J3081 Allergic rhinitis due to animal (cat) (dog) hair and dander: Secondary | ICD-10-CM | POA: Diagnosis not present

## 2017-04-15 DIAGNOSIS — J301 Allergic rhinitis due to pollen: Secondary | ICD-10-CM | POA: Diagnosis not present

## 2017-04-15 DIAGNOSIS — J3089 Other allergic rhinitis: Secondary | ICD-10-CM | POA: Diagnosis not present

## 2017-04-17 DIAGNOSIS — F4310 Post-traumatic stress disorder, unspecified: Secondary | ICD-10-CM | POA: Diagnosis not present

## 2017-04-23 DIAGNOSIS — J3089 Other allergic rhinitis: Secondary | ICD-10-CM | POA: Diagnosis not present

## 2017-04-23 DIAGNOSIS — J3081 Allergic rhinitis due to animal (cat) (dog) hair and dander: Secondary | ICD-10-CM | POA: Diagnosis not present

## 2017-04-23 DIAGNOSIS — J301 Allergic rhinitis due to pollen: Secondary | ICD-10-CM | POA: Diagnosis not present

## 2017-04-24 DIAGNOSIS — F4310 Post-traumatic stress disorder, unspecified: Secondary | ICD-10-CM | POA: Diagnosis not present

## 2017-04-30 DIAGNOSIS — J3081 Allergic rhinitis due to animal (cat) (dog) hair and dander: Secondary | ICD-10-CM | POA: Diagnosis not present

## 2017-04-30 DIAGNOSIS — J301 Allergic rhinitis due to pollen: Secondary | ICD-10-CM | POA: Diagnosis not present

## 2017-04-30 DIAGNOSIS — J3089 Other allergic rhinitis: Secondary | ICD-10-CM | POA: Diagnosis not present

## 2017-05-03 DIAGNOSIS — J3089 Other allergic rhinitis: Secondary | ICD-10-CM | POA: Diagnosis not present

## 2017-05-03 DIAGNOSIS — J301 Allergic rhinitis due to pollen: Secondary | ICD-10-CM | POA: Diagnosis not present

## 2017-05-03 DIAGNOSIS — J3081 Allergic rhinitis due to animal (cat) (dog) hair and dander: Secondary | ICD-10-CM | POA: Diagnosis not present

## 2017-05-04 ENCOUNTER — Other Ambulatory Visit: Payer: Self-pay | Admitting: Internal Medicine

## 2017-05-11 DIAGNOSIS — J3089 Other allergic rhinitis: Secondary | ICD-10-CM | POA: Diagnosis not present

## 2017-05-11 DIAGNOSIS — J301 Allergic rhinitis due to pollen: Secondary | ICD-10-CM | POA: Diagnosis not present

## 2017-05-11 DIAGNOSIS — J3081 Allergic rhinitis due to animal (cat) (dog) hair and dander: Secondary | ICD-10-CM | POA: Diagnosis not present

## 2017-05-15 DIAGNOSIS — F4310 Post-traumatic stress disorder, unspecified: Secondary | ICD-10-CM | POA: Diagnosis not present

## 2017-05-17 ENCOUNTER — Other Ambulatory Visit: Payer: Self-pay | Admitting: Internal Medicine

## 2017-05-21 DIAGNOSIS — J301 Allergic rhinitis due to pollen: Secondary | ICD-10-CM | POA: Diagnosis not present

## 2017-05-21 DIAGNOSIS — J3089 Other allergic rhinitis: Secondary | ICD-10-CM | POA: Diagnosis not present

## 2017-05-21 DIAGNOSIS — J3081 Allergic rhinitis due to animal (cat) (dog) hair and dander: Secondary | ICD-10-CM | POA: Diagnosis not present

## 2017-05-28 DIAGNOSIS — J3081 Allergic rhinitis due to animal (cat) (dog) hair and dander: Secondary | ICD-10-CM | POA: Diagnosis not present

## 2017-05-28 DIAGNOSIS — J301 Allergic rhinitis due to pollen: Secondary | ICD-10-CM | POA: Diagnosis not present

## 2017-05-28 DIAGNOSIS — J3089 Other allergic rhinitis: Secondary | ICD-10-CM | POA: Diagnosis not present

## 2017-06-17 DIAGNOSIS — J3089 Other allergic rhinitis: Secondary | ICD-10-CM | POA: Diagnosis not present

## 2017-06-17 DIAGNOSIS — J3081 Allergic rhinitis due to animal (cat) (dog) hair and dander: Secondary | ICD-10-CM | POA: Diagnosis not present

## 2017-06-17 DIAGNOSIS — J301 Allergic rhinitis due to pollen: Secondary | ICD-10-CM | POA: Diagnosis not present

## 2017-07-05 DIAGNOSIS — J3089 Other allergic rhinitis: Secondary | ICD-10-CM | POA: Diagnosis not present

## 2017-07-05 DIAGNOSIS — J301 Allergic rhinitis due to pollen: Secondary | ICD-10-CM | POA: Diagnosis not present

## 2017-07-05 DIAGNOSIS — J3081 Allergic rhinitis due to animal (cat) (dog) hair and dander: Secondary | ICD-10-CM | POA: Diagnosis not present

## 2017-07-14 DIAGNOSIS — J3089 Other allergic rhinitis: Secondary | ICD-10-CM | POA: Diagnosis not present

## 2017-07-14 DIAGNOSIS — J301 Allergic rhinitis due to pollen: Secondary | ICD-10-CM | POA: Diagnosis not present

## 2017-07-14 DIAGNOSIS — J3081 Allergic rhinitis due to animal (cat) (dog) hair and dander: Secondary | ICD-10-CM | POA: Diagnosis not present

## 2017-07-22 ENCOUNTER — Telehealth: Payer: 59 | Admitting: Family

## 2017-07-22 DIAGNOSIS — H5789 Other specified disorders of eye and adnexa: Secondary | ICD-10-CM

## 2017-07-22 NOTE — Progress Notes (Signed)
Thank you for the details you included in the comment boxes. Those details are very helpful in determining the best course of treatment for you and help [place] to provide the best care. I saw the history of eye surgery and I read your chart and saw it was cosmetic surgery on your eyelid 2.5 years ago. Occasionally, scar tissue can bother your eye after surgeries like this, but it is often irritable and worsens over time rather than sudden onset. That being said, continue to use Visine or similar eye drops throughout the day. See the detailed guidelines below about worsening of your condition. Typically, these situations resolve without treatment. If you begin to get colored eye discharge or crusting in this eye, please reach out to me (reply to this) and I will send antibiotics. At this time, it would be too early to consider that with the clear tearing and sometimes these will actually irritate your eye if it is a scratch from particles (which can occur during sleep at times). Please note that the template mentions pinkeye below (I am not diagnosing you with that, but the E-visit program provides guidelines for this also). The rest of the template below applies to you.   ------------------------------------------------------------------------------------  We are sorry that you are not feeling well.  Here is how we plan to help!  Based on what you have shared with me it looks like you have conjunctivitis.  Conjunctivitis is a common inflammatory or infectious condition of the eye that is often referred to as "pink eye".  In most cases it is contagious (viral or bacterial). However, not all conjunctivitis requires antibiotics (ex. Allergic).  We have made appropriate suggestions for you based upon your presentation.  Pink eye can be highly contagious.  It is typically spread through direct contact with secretions, or contaminated objects or surfaces that one may have touched.  Strict handwashing is suggested with  soap and water is urged.  If not available, use alcohol based had sanitizer.  Avoid unnecessary touching of the eye.  If you wear contact lenses, you will need to refrain from wearing them until you see no white discharge from the eye for at least 24 hours after being on medication.  You should see symptom improvement in 1-2 days after starting the medication regimen.  Call us if symptoms are not improved in 1-2 days.  Home Care:  Wash your hands often!  Do not wear your contacts until you complete your treatment plan.  Avoid sharing towels, bed linen, personal items with a person who has pink eye.  See attention for anyone in your home with similar symptoms.  Get Help Right Away If:  Your symptoms do not improve.  You develop blurred or loss of vision.  Your symptoms worsen (increased discharge, pain or redness)  Your e-visit answers were reviewed by a board certified advanced clinical practitioner to complete your personal care plan.  Depending on the condition, your plan could have included both over the counter or prescription medications.  If there is a problem please reply  once you have received a response from your provider.  Your safety is important to [place].  If you have drug allergies check your prescription carefully.    You can use MyChart to ask questions about today's visit, request a non-urgent call back, or ask for a work or school excuse for 24 hours related to this e-Visit. If it has been greater than 24 hours you will need to follow up with your  provider, or enter a new e-Visit to address those concerns.   You will get an e-mail in the next two days asking about your experience.  I hope that your e-visit has been valuable and will speed your recovery. Thank you for using e-visits.

## 2017-07-23 DIAGNOSIS — J301 Allergic rhinitis due to pollen: Secondary | ICD-10-CM | POA: Diagnosis not present

## 2017-07-23 DIAGNOSIS — J3081 Allergic rhinitis due to animal (cat) (dog) hair and dander: Secondary | ICD-10-CM | POA: Diagnosis not present

## 2017-07-23 DIAGNOSIS — J3089 Other allergic rhinitis: Secondary | ICD-10-CM | POA: Diagnosis not present

## 2017-07-28 DIAGNOSIS — J3081 Allergic rhinitis due to animal (cat) (dog) hair and dander: Secondary | ICD-10-CM | POA: Diagnosis not present

## 2017-07-28 DIAGNOSIS — J3089 Other allergic rhinitis: Secondary | ICD-10-CM | POA: Diagnosis not present

## 2017-07-28 DIAGNOSIS — J301 Allergic rhinitis due to pollen: Secondary | ICD-10-CM | POA: Diagnosis not present

## 2017-08-03 DIAGNOSIS — J3089 Other allergic rhinitis: Secondary | ICD-10-CM | POA: Diagnosis not present

## 2017-08-03 DIAGNOSIS — J301 Allergic rhinitis due to pollen: Secondary | ICD-10-CM | POA: Diagnosis not present

## 2017-08-03 DIAGNOSIS — J3081 Allergic rhinitis due to animal (cat) (dog) hair and dander: Secondary | ICD-10-CM | POA: Diagnosis not present

## 2017-08-30 ENCOUNTER — Telehealth: Payer: 59 | Admitting: Family

## 2017-08-30 DIAGNOSIS — J329 Chronic sinusitis, unspecified: Secondary | ICD-10-CM | POA: Diagnosis not present

## 2017-08-30 DIAGNOSIS — B9689 Other specified bacterial agents as the cause of diseases classified elsewhere: Secondary | ICD-10-CM

## 2017-08-30 MED ORDER — AMOXICILLIN-POT CLAVULANATE 875-125 MG PO TABS: 1.0000 | ORAL_TABLET | Freq: Two times a day (BID) | ORAL | 0 refills | Status: AC

## 2017-08-30 NOTE — Progress Notes (Signed)
Thank you for the details you included in the comment boxes. Those details are very helpful in determining the best course of treatment for you and help [place] to provide the best care. It sounds like you may have had sinusitis/pharyngitis with flu-like symptoms. Occasionally, a viral infection and bacterial infection will co-exist, making it more difficult to diagnose. As your symptoms have progressed and are worsening in terms of the sinus infection, we will treat that as below.  We are sorry that you are not feeling well.  Here is how we plan to help!  Based on what you have shared with me it looks like you have sinusitis.  Sinusitis is inflammation and infection in the sinus cavities of the head.  Based on your presentation I believe you most likely have Acute Bacterial Sinusitis.  This is an infection caused by bacteria and is treated with antibiotics. I have prescribed Augmentin 875mg /125mg  one tablet twice daily with food, for 7 days. You may use an oral decongestant such as Mucinex D or if you have glaucoma or high blood pressure use plain Mucinex. Saline nasal spray help and can safely be used as often as needed for congestion.  If you develop worsening sinus pain, fever or notice severe headache and vision changes, or if symptoms are not better after completion of antibiotic, please schedule an appointment with a health care provider.    Sinus infections are not as easily transmitted as other respiratory infection, however we still recommend that you avoid close contact with loved ones, especially the very young and elderly.  Remember to wash your hands thoroughly throughout the day as this is the number one way to prevent the spread of infection!  Home Care:  Only take medications as instructed by your medical team.  Complete the entire course of an antibiotic.  Do not take these medications with alcohol.  A steam or ultrasonic humidifier can help congestion.  You can place a towel over your  head and breathe in the steam from hot water coming from a faucet.  Avoid close contacts especially the very young and the elderly.  Cover your mouth when you cough or sneeze.  Always remember to wash your hands.  Get Help Right Away If:  You develop worsening fever or sinus pain.  You develop a severe head ache or visual changes.  Your symptoms persist after you have completed your treatment plan.  Make sure you  Understand these instructions.  Will watch your condition.  Will get help right away if you are not doing well or get worse.  Your e-visit answers were reviewed by a board certified advanced clinical practitioner to complete your personal care plan.  Depending on the condition, your plan could have included both over the counter or prescription medications.  If there is a problem please reply  once you have received a response from your provider.  Your safety is important to [place].  If you have drug allergies check your prescription carefully.    You can use MyChart to ask questions about today's visit, request a non-urgent call back, or ask for a work or school excuse for 24 hours related to this e-Visit. If it has been greater than 24 hours you will need to follow up with your provider, or enter a new e-Visit to address those concerns.  You will get an e-mail in the next two days asking about your experience.  I hope that your e-visit has been valuable and will speed your  recovery. Thank you for using e-visits.

## 2017-09-10 DIAGNOSIS — J3089 Other allergic rhinitis: Secondary | ICD-10-CM | POA: Diagnosis not present

## 2017-09-10 DIAGNOSIS — J3081 Allergic rhinitis due to animal (cat) (dog) hair and dander: Secondary | ICD-10-CM | POA: Diagnosis not present

## 2017-09-10 DIAGNOSIS — J301 Allergic rhinitis due to pollen: Secondary | ICD-10-CM | POA: Diagnosis not present

## 2017-09-17 DIAGNOSIS — J301 Allergic rhinitis due to pollen: Secondary | ICD-10-CM | POA: Diagnosis not present

## 2017-09-17 DIAGNOSIS — J3089 Other allergic rhinitis: Secondary | ICD-10-CM | POA: Diagnosis not present

## 2017-09-17 DIAGNOSIS — J3081 Allergic rhinitis due to animal (cat) (dog) hair and dander: Secondary | ICD-10-CM | POA: Diagnosis not present

## 2017-09-27 ENCOUNTER — Telehealth: Payer: Self-pay | Admitting: Neurology

## 2017-09-27 NOTE — Telephone Encounter (Signed)
He has had pain in the right upper back, the worst pain is in the right trapezius and supraspinatus area and he also has pain in the right rhomboid area.  Range of motion was normal in the right shoulder.  The right trapezius and rhomboid were injected with 80 mg Depo-Medrol in 3 cc Marcaine.  He tolerated the procedure well.

## 2017-10-08 DIAGNOSIS — J3081 Allergic rhinitis due to animal (cat) (dog) hair and dander: Secondary | ICD-10-CM | POA: Diagnosis not present

## 2017-10-08 DIAGNOSIS — J3089 Other allergic rhinitis: Secondary | ICD-10-CM | POA: Diagnosis not present

## 2017-10-08 DIAGNOSIS — J301 Allergic rhinitis due to pollen: Secondary | ICD-10-CM | POA: Diagnosis not present

## 2017-11-19 DIAGNOSIS — J301 Allergic rhinitis due to pollen: Secondary | ICD-10-CM | POA: Diagnosis not present

## 2017-11-19 DIAGNOSIS — J3081 Allergic rhinitis due to animal (cat) (dog) hair and dander: Secondary | ICD-10-CM | POA: Diagnosis not present

## 2017-11-19 DIAGNOSIS — J3089 Other allergic rhinitis: Secondary | ICD-10-CM | POA: Diagnosis not present

## 2017-11-27 ENCOUNTER — Other Ambulatory Visit: Payer: Self-pay | Admitting: Internal Medicine

## 2017-12-08 ENCOUNTER — Encounter: Payer: 59 | Admitting: Internal Medicine

## 2017-12-08 DIAGNOSIS — J3089 Other allergic rhinitis: Secondary | ICD-10-CM | POA: Diagnosis not present

## 2017-12-08 DIAGNOSIS — J301 Allergic rhinitis due to pollen: Secondary | ICD-10-CM | POA: Diagnosis not present

## 2017-12-08 DIAGNOSIS — J3081 Allergic rhinitis due to animal (cat) (dog) hair and dander: Secondary | ICD-10-CM | POA: Diagnosis not present

## 2017-12-10 DIAGNOSIS — J3089 Other allergic rhinitis: Secondary | ICD-10-CM | POA: Diagnosis not present

## 2018-01-05 DIAGNOSIS — J3089 Other allergic rhinitis: Secondary | ICD-10-CM | POA: Diagnosis not present

## 2018-01-05 DIAGNOSIS — J3081 Allergic rhinitis due to animal (cat) (dog) hair and dander: Secondary | ICD-10-CM | POA: Diagnosis not present

## 2018-01-05 DIAGNOSIS — J301 Allergic rhinitis due to pollen: Secondary | ICD-10-CM | POA: Diagnosis not present

## 2018-01-10 ENCOUNTER — Encounter: Payer: Self-pay | Admitting: Internal Medicine

## 2018-01-12 ENCOUNTER — Ambulatory Visit: Payer: 59 | Admitting: Internal Medicine

## 2018-01-14 ENCOUNTER — Encounter: Payer: Self-pay | Admitting: Internal Medicine

## 2018-01-14 ENCOUNTER — Ambulatory Visit (INDEPENDENT_AMBULATORY_CARE_PROVIDER_SITE_OTHER)
Admission: RE | Admit: 2018-01-14 | Discharge: 2018-01-14 | Disposition: A | Discharge status: Home / Self Care | Payer: 59 | Source: Ambulatory Visit | Attending: Internal Medicine | Admitting: Internal Medicine

## 2018-01-14 ENCOUNTER — Ambulatory Visit: Payer: 59 | Admitting: Internal Medicine

## 2018-01-14 DIAGNOSIS — J4530 Mild persistent asthma, uncomplicated: Secondary | ICD-10-CM

## 2018-01-14 DIAGNOSIS — D1720 Benign lipomatous neoplasm of skin and subcutaneous tissue of unspecified limb: Secondary | ICD-10-CM | POA: Insufficient documentation

## 2018-01-14 DIAGNOSIS — M7061 Trochanteric bursitis, right hip: Secondary | ICD-10-CM | POA: Diagnosis not present

## 2018-01-14 DIAGNOSIS — D1722 Benign lipomatous neoplasm of skin and subcutaneous tissue of left arm: Secondary | ICD-10-CM

## 2018-01-14 DIAGNOSIS — R05 Cough: Secondary | ICD-10-CM | POA: Diagnosis not present

## 2018-01-14 NOTE — Patient Instructions (Signed)
Trochanteric Bursitis Trochanteric bursitis is a condition that causes hip pain. Trochanteric bursitis happens when fluid-filled sacs (bursae) in the hip get irritated. Normally these sacs absorb shock and help strong bands of tissue (tendons) in your hip glide smoothly over each other and over your hip bones. What are the causes? This condition results from increased friction between the hip bones and the tendons that go over them. This condition can happen if you:  Have weak hips.  Use your hip muscles too much (overuse).  Get hit in the hip.  What increases the risk? This condition is more likely to develop in:  Women.  Adults who are middle-aged or older.  People with arthritis or a spinal condition.  People with weak buttocks muscles (gluteal muscles).  People who have one leg that is shorter than the other.  People who participate in certain kinds of athletic activities, such as: ? Running sports, especially long-distance running. ? Contact sports, like football or martial arts. ? Sports in which falls may occur, like skiing.  What are the signs or symptoms? The main symptom of this condition is pain and tenderness over the point of your hip. The pain may be:  Sharp and intense.  Dull and achy.  Felt on the outside of your thigh.  It may increase when you:  Lie on your side.  Walk or run.  Go up on stairs.  Sit.  Stand up after sitting.  Stand for long periods of time.  How is this diagnosed? This condition may be diagnosed based on:  Your symptoms.  Your medical history.  A physical exam.  Imaging tests, such as: ? X-rays to check your bones. ? An MRI or ultrasound to check your tendons and muscles.  During your physical exam, your health care provider will check the movement and strength of your hip. He or she may press on the point of your hip to check for pain. How is this treated? This condition may be treated by:  Resting.  Reducing  your activity.  Avoiding activities that cause pain.  Using crutches, a cane, or a walker to decrease the strain on your hip.  Taking medicine to help with swelling.  Having medicine injected into the bursae to help with swelling.  Using ice, heat, and massage therapy for pain relief.  Physical therapy exercises for strength and flexibility.  Surgery (rare).  Follow these instructions at home: Activity  Rest.  Avoid activities that cause pain.  Return to your normal activities as told by your health care provider. Ask your health care provider what activities are safe for you. Managing pain, stiffness, and swelling  Take over-the-counter and prescription medicines only as told by your health care provider.  If directed, apply heat to the injured area as told by your health care provider. ? Place a towel between your skin and the heat source. ? Leave the heat on for 20-30 minutes. ? Remove the heat if your skin turns bright red. This is especially important if you are unable to feel pain, heat, or cold. You may have a greater risk of getting burned.  If directed, apply ice to the injured area: ? Put ice in a plastic bag. ? Place a towel between your skin and the bag. ? Leave the ice on for 20 minutes, 2-3 times a day. General instructions  If the affected leg is one that you use for driving, ask your health care provider when it is safe to drive.    Use crutches, a cane, or a walker as told by your health care provider.  If one of your legs is shorter than the other, get fitted for a shoe insert.  Lose weight if you are overweight. How is this prevented?  Wear supportive footwear that is appropriate for your sport.  If you have hip pain, start any new exercise or sport slowly.  Maintain physical fitness, including: ? Strength. ? Flexibility. Contact a health care provider if:  Your pain does not improve with 2-4 weeks. Get help right away if:  You develop  severe pain.  You have a fever.  You develop increased redness over your hip.  You have a change in your bowel function or bladder function.  You cannot control the muscles in your feet. This information is not intended to replace advice given to you by your health care provider. Make sure you discuss any questions you have with your health care provider. Document Released: 07/09/2004 Document Revised: 02/05/2016 Document Reviewed: 05/17/2015 Elsevier Interactive Patient Education  2018 [employer]  

## 2018-01-14 NOTE — Assessment & Plan Note (Addendum)
Discussed stretching, ice Motrin po prn

## 2018-01-14 NOTE — Assessment & Plan Note (Signed)
CXR

## 2018-01-14 NOTE — Progress Notes (Signed)
Subjective:  Patient ID: Derek Tucker, male    DOB: [birth year]  Age: 47 y.o. MRN: [id number]  CC: No chief complaint on file.   HPI [facility] presents for ?lipmas on L forearm C/o lower breast bone assymetry C/o R hip pain x 3 months  Outpatient Medications Prior to Visit  Medication Sig Dispense Refill  . albuterol (PROVENTIL HFA;VENTOLIN HFA) 108 (90 Base) MCG/ACT inhaler Inhale 2 puffs into the lungs every 6 (six) hours as needed for wheezing. 18 g 11  . ALPRAZolam (XANAX) 0.25 MG tablet TAKE 1 TABLET BY MOUTH TWICE DAILY AS NEEDED FOR ANXIETY 10 tablet 5  . fish oil-omega-3 fatty acids 1000 MG capsule Take 2 g by mouth daily.    . fluticasone (FLONASE) 50 MCG/ACT nasal spray Place 4 sprays into the nose daily. 16 g 11  . fluticasone (FLOVENT HFA) 110 MCG/ACT inhaler Inhale 1 puff into the lungs 2 (two) times daily. (Patient taking differently: Inhale 1 puff into the lungs 2 (two) times daily as needed. ) 1 Inhaler 12  . Glucosamine Sulfate-MSM (MSM-GLUCOSAMINE PO) Take by mouth.    . Spacer/Aero Chamber Mouthpiece MISC 1 Act by Does not apply route 2 (two) times daily. 1 each 11  . SUMAtriptan-naproxen (TREXIMET) 85-500 MG tablet Take 1 tablet by mouth every 2 (two) hours as needed for migraine. 10 tablet 2   No facility-administered medications prior to visit.     ROS: Review of Systems  Constitutional: Negative for appetite change, fatigue and unexpected weight change.  HENT: Negative for congestion, nosebleeds, sneezing, sore throat and trouble swallowing.   Eyes: Negative for itching and visual disturbance.  Respiratory: Positive for cough.   Cardiovascular: Negative for chest pain, palpitations and leg swelling.  Gastrointestinal: Negative for abdominal distention, blood in stool, diarrhea and nausea.  Genitourinary: Negative for frequency and hematuria.  Musculoskeletal: Positive for arthralgias. Negative for back pain, gait problem, joint swelling and  neck pain.  Skin: Negative for color change, rash and wound.  Neurological: Negative for dizziness, tremors, speech difficulty and weakness.  Psychiatric/Behavioral: Negative for agitation, dysphoric mood and sleep disturbance. The patient is not nervous/anxious.     Objective:  BP 124/78 (BP Location: Left Arm, Patient Position: Sitting, Cuff Size: Large)   Pulse 82   Temp 98.1 F (36.7 C) (Oral)   Ht 5\' 11"  (1.803 m)   Wt 191 lb (86.6 kg)   SpO2 97%   BMI 26.64 kg/m   BP Readings from Last 3 Encounters:  01/14/18 124/78  01/19/17 128/88  12/18/16 126/84    Wt Readings from Last 3 Encounters:  01/14/18 191 lb (86.6 kg)  01/19/17 195 lb (88.5 kg)  12/18/16 197 lb (89.4 kg)    Physical Exam  Constitutional: He is oriented to person, place, and time. He appears well-developed. No distress.  NAD  HENT:  Mouth/Throat: Oropharynx is clear and moist.  Eyes: Pupils are equal, round, and reactive to light. Conjunctivae are normal.  Neck: Normal range of motion. No JVD present. No thyromegaly present.  Cardiovascular: Normal rate, regular rhythm, normal heart sounds and intact distal pulses. Exam reveals no gallop and no friction rub.  No murmur heard. Pulmonary/Chest: Effort normal and breath sounds normal. No respiratory distress. He has no wheezes. He has no rales. He exhibits no tenderness.  Abdominal: Soft. Bowel sounds are normal. He exhibits no distension and no mass. There is no tenderness. There is no rebound and no guarding.  Musculoskeletal: Normal range  of motion. He exhibits tenderness. He exhibits no edema.  Lymphadenopathy:    He has no cervical adenopathy.  Neurological: He is alert and oriented to person, place, and time. He has normal reflexes. No cranial nerve deficit. He exhibits normal muscle tone. He displays a negative Romberg sign. Coordination and gait normal.  Skin: Skin is warm and dry. No rash noted.  Psychiatric: He has a normal mood and affect. His  behavior is normal. Judgment and thought content normal.  small sq lipomas on L forearm <1 cm. Checked w/US - hard to define borders and no vascularity Chest wall NT R lat hip - tender B hips w/a full ROM, NT  Lab Results  Component Value Date   WBC 9.8 01/19/2017   HGB 15.4 01/19/2017   HCT 44.8 01/19/2017   PLT 309.0 01/19/2017   GLUCOSE 125 (H) 01/19/2017   CHOL 157 01/19/2017   TRIG 278.0 (H) 01/19/2017   HDL 37.10 (L) 01/19/2017   LDLDIRECT 85.0 01/19/2017   LDLCALC 85 06/25/2014   ALT 28 01/19/2017   AST 23 01/19/2017   NA 139 01/19/2017   K 4.0 01/19/2017   CL 101 01/19/2017   CREATININE 0.97 01/19/2017   BUN 12 01/19/2017   CO2 28 01/19/2017   TSH 2.94 05/28/2014   PSA 0.48 06/25/2014    Mr Thoracic Spine Wo Contrast  Result Date: 02/01/2017 CLINICAL DATA:  Chronic thoracic and lumbar spine pain. No known injury. EXAM: MRI THORACIC AND LUMBAR SPINE WITHOUT CONTRAST TECHNIQUE: Multiplanar and multiecho pulse sequences of the thoracic and lumbar spine were obtained without intravenous contrast. COMPARISON:  None. FINDINGS: MRI THORACIC SPINE FINDINGS Alignment:  Maintained. Vertebrae: No fracture or worrisome lesion. A few scattered hemangiomas are incidentally noted, most prominent in T9. Cord:  Normal signal throughout. Paraspinal and other soft tissues: Negative. Disc levels: C7-T1:  Tiny central protrusion without stenosis. T6-7: Punctate annular fissure and tiny central protrusion without stenosis. T7-8: Punctate annular fissure and tiny left paracentral protrusion without stenosis. T9-10: Tiny left paracentral protrusion without stenosis. Except as described, intervertebral disc spaces are unremarkable. MRI LUMBAR SPINE FINDINGS Segmentation:  Standard. Alignment:  Maintained. Vertebrae:  Height and signal are normal. Conus medullaris: Extends to the T12-L1 level and appears normal. Paraspinal and other soft tissues: Negative. Disc levels: L1-2:  Negative. L2-3:  Negative.  L3-4:  Negative. L4-5: Minimal disc bulge without central canal or foraminal stenosis. L5-S1:  Negative. IMPRESSION: MR THORACIC SPINE IMPRESSION Very mild degenerative change without central canal or foraminal narrowing. Otherwise negative. MR LUMBAR SPINE IMPRESSION Minimal disc bulge L4-5 without central canal or foraminal stenosis. Otherwise negative. Electronically Signed   By: Inge Rise M.D.   On: 02/01/2017 07:48   Mr Lumbar Spine Wo Contrast  Result Date: 02/01/2017 CLINICAL DATA:  Chronic thoracic and lumbar spine pain. No known injury. EXAM: MRI THORACIC AND LUMBAR SPINE WITHOUT CONTRAST TECHNIQUE: Multiplanar and multiecho pulse sequences of the thoracic and lumbar spine were obtained without intravenous contrast. COMPARISON:  None. FINDINGS: MRI THORACIC SPINE FINDINGS Alignment:  Maintained. Vertebrae: No fracture or worrisome lesion. A few scattered hemangiomas are incidentally noted, most prominent in T9. Cord:  Normal signal throughout. Paraspinal and other soft tissues: Negative. Disc levels: C7-T1:  Tiny central protrusion without stenosis. T6-7: Punctate annular fissure and tiny central protrusion without stenosis. T7-8: Punctate annular fissure and tiny left paracentral protrusion without stenosis. T9-10: Tiny left paracentral protrusion without stenosis. Except as described, intervertebral disc spaces are unremarkable. MRI LUMBAR SPINE FINDINGS  Segmentation:  Standard. Alignment:  Maintained. Vertebrae:  Height and signal are normal. Conus medullaris: Extends to the T12-L1 level and appears normal. Paraspinal and other soft tissues: Negative. Disc levels: L1-2:  Negative. L2-3:  Negative. L3-4:  Negative. L4-5: Minimal disc bulge without central canal or foraminal stenosis. L5-S1:  Negative. IMPRESSION: MR THORACIC SPINE IMPRESSION Very mild degenerative change without central canal or foraminal narrowing. Otherwise negative. MR LUMBAR SPINE IMPRESSION Minimal disc bulge L4-5 without  central canal or foraminal stenosis. Otherwise negative. Electronically Signed   By: Inge Rise M.D.   On: 02/01/2017 07:48   Mr Sacrum Si Joints W Wo Contrast  Result Date: 02/01/2017 CLINICAL DATA:  Sacral and sacrococcygeal radiculopathy. EXAM: MRI SACRUM AND COCCYX WITHOUT AND WITH CONTRAST TECHNIQUE: Multiplanar and multiecho pulse sequences of the sacrum and coccyx were obtained without and with intravenous contrast. CONTRAST:  51mL MULTIHANCE GADOBENATE DIMEGLUMINE 529 MG/ML IV SOLN COMPARISON:  None. FINDINGS: Segmentation:  Standard. Alignment:  Physiologic. Vertebrae: The segments of the sacrum and coccyx appear normal. There is no mass lesion, Tarlov cyst, or other abnormality. Sacroiliac joints:  Normal. Paraspinal and other soft tissues: Normal. IMPRESSION: Normal MRI of the sacrum and coccyx. Electronically Signed   By: Lorriane Shire M.D.   On: 02/01/2017 08:30    Assessment & Plan:   There are no diagnoses linked to this encounter.   No orders of the defined types were placed in this encounter.    Follow-up: No follow-ups on file.  Walker Kehr, MD

## 2018-01-14 NOTE — Assessment & Plan Note (Addendum)
2 small sq lipomas on L forearm <1 cm. Checked w/US - hard to define borders and no vascularity on doppler Will watch

## 2018-01-19 DIAGNOSIS — J3081 Allergic rhinitis due to animal (cat) (dog) hair and dander: Secondary | ICD-10-CM | POA: Diagnosis not present

## 2018-01-19 DIAGNOSIS — J3089 Other allergic rhinitis: Secondary | ICD-10-CM | POA: Diagnosis not present

## 2018-01-19 DIAGNOSIS — J301 Allergic rhinitis due to pollen: Secondary | ICD-10-CM | POA: Diagnosis not present

## 2018-01-28 DIAGNOSIS — F319 Bipolar disorder, unspecified: Secondary | ICD-10-CM | POA: Diagnosis not present

## 2018-02-10 DIAGNOSIS — J3089 Other allergic rhinitis: Secondary | ICD-10-CM | POA: Diagnosis not present

## 2018-02-10 DIAGNOSIS — J3081 Allergic rhinitis due to animal (cat) (dog) hair and dander: Secondary | ICD-10-CM | POA: Diagnosis not present

## 2018-02-10 DIAGNOSIS — J301 Allergic rhinitis due to pollen: Secondary | ICD-10-CM | POA: Diagnosis not present

## 2018-02-22 ENCOUNTER — Other Ambulatory Visit: Payer: Self-pay | Admitting: Internal Medicine

## 2018-02-22 DIAGNOSIS — J4530 Mild persistent asthma, uncomplicated: Secondary | ICD-10-CM

## 2018-02-25 DIAGNOSIS — J3089 Other allergic rhinitis: Secondary | ICD-10-CM | POA: Diagnosis not present

## 2018-02-25 DIAGNOSIS — J3081 Allergic rhinitis due to animal (cat) (dog) hair and dander: Secondary | ICD-10-CM | POA: Diagnosis not present

## 2018-02-25 DIAGNOSIS — J301 Allergic rhinitis due to pollen: Secondary | ICD-10-CM | POA: Diagnosis not present

## 2018-02-28 ENCOUNTER — Encounter: Payer: Self-pay | Admitting: Internal Medicine

## 2018-03-02 ENCOUNTER — Other Ambulatory Visit: Payer: 59

## 2018-03-03 DIAGNOSIS — J3089 Other allergic rhinitis: Secondary | ICD-10-CM | POA: Diagnosis not present

## 2018-03-03 DIAGNOSIS — J3081 Allergic rhinitis due to animal (cat) (dog) hair and dander: Secondary | ICD-10-CM | POA: Diagnosis not present

## 2018-03-03 DIAGNOSIS — J301 Allergic rhinitis due to pollen: Secondary | ICD-10-CM | POA: Diagnosis not present

## 2018-03-08 ENCOUNTER — Ambulatory Visit: Payer: 59 | Admitting: Internal Medicine

## 2018-03-08 ENCOUNTER — Encounter: Payer: Self-pay | Admitting: Internal Medicine

## 2018-03-08 ENCOUNTER — Other Ambulatory Visit (INDEPENDENT_AMBULATORY_CARE_PROVIDER_SITE_OTHER): Payer: 59

## 2018-03-08 VITALS — BP 138/92 | HR 74 | Temp 97.8°F | Resp 16 | Ht 71.0 in | Wt 194.5 lb

## 2018-03-08 DIAGNOSIS — R10811 Right upper quadrant abdominal tenderness: Secondary | ICD-10-CM

## 2018-03-08 DIAGNOSIS — R1011 Right upper quadrant pain: Secondary | ICD-10-CM | POA: Diagnosis not present

## 2018-03-08 DIAGNOSIS — R319 Hematuria, unspecified: Secondary | ICD-10-CM | POA: Diagnosis not present

## 2018-03-08 DIAGNOSIS — R160 Hepatomegaly, not elsewhere classified: Secondary | ICD-10-CM

## 2018-03-08 DIAGNOSIS — I10 Essential (primary) hypertension: Secondary | ICD-10-CM

## 2018-03-08 LAB — URINALYSIS, ROUTINE W REFLEX MICROSCOPIC
Bilirubin Urine: NEGATIVE
Ketones, ur: NEGATIVE
Leukocytes, UA: NEGATIVE
Nitrite: NEGATIVE
SPECIFIC GRAVITY, URINE: 1.015 (ref 1.000–1.030)
TOTAL PROTEIN, URINE-UPE24: NEGATIVE
URINE GLUCOSE: NEGATIVE
Urobilinogen, UA: 0.2 (ref 0.0–1.0)
WBC, UA: NONE SEEN (ref 0–?)
pH: 7.5 (ref 5.0–8.0)

## 2018-03-08 LAB — CBC WITH DIFFERENTIAL/PLATELET
BASOS ABS: 0.1 10*3/uL (ref 0.0–0.1)
Basophils Relative: 1.3 % (ref 0.0–3.0)
EOS ABS: 0.3 10*3/uL (ref 0.0–0.7)
Eosinophils Relative: 4.2 % (ref 0.0–5.0)
HEMATOCRIT: 42.2 % (ref 39.0–52.0)
Hemoglobin: 14.8 g/dL (ref 13.0–17.0)
LYMPHS PCT: 31.1 % (ref 12.0–46.0)
Lymphs Abs: 2.1 10*3/uL (ref 0.7–4.0)
MCHC: 35 g/dL (ref 30.0–36.0)
MCV: 84.8 fl (ref 78.0–100.0)
MONO ABS: 0.7 10*3/uL (ref 0.1–1.0)
Monocytes Relative: 9.9 % (ref 3.0–12.0)
NEUTROS PCT: 53.5 % (ref 43.0–77.0)
Neutro Abs: 3.6 10*3/uL (ref 1.4–7.7)
PLATELETS: 301 10*3/uL (ref 150.0–400.0)
RBC: 4.98 Mil/uL (ref 4.22–5.81)
RDW: 13.2 % (ref 11.5–15.5)
WBC: 6.7 10*3/uL (ref 4.0–10.5)

## 2018-03-08 LAB — HEPATIC FUNCTION PANEL
ALBUMIN: 4.8 g/dL (ref 3.5–5.2)
ALK PHOS: 42 U/L (ref 39–117)
ALT: 25 U/L (ref 0–53)
AST: 20 U/L (ref 0–37)
BILIRUBIN DIRECT: 0.2 mg/dL (ref 0.0–0.3)
TOTAL PROTEIN: 7.9 g/dL (ref 6.0–8.3)
Total Bilirubin: 1.1 mg/dL (ref 0.2–1.2)

## 2018-03-08 LAB — BASIC METABOLIC PANEL
BUN: 10 mg/dL (ref 6–23)
CALCIUM: 10.2 mg/dL (ref 8.4–10.5)
CO2: 30 mEq/L (ref 19–32)
CREATININE: 0.97 mg/dL (ref 0.40–1.50)
Chloride: 99 mEq/L (ref 96–112)
GFR: 87.23 mL/min (ref >60.00)
Glucose, Bld: 96 mg/dL (ref 70–99)
Potassium: 3.8 mEq/L (ref 3.5–5.1)
Sodium: 136 mEq/L (ref 135–145)

## 2018-03-08 LAB — TSH: TSH: 3.29 u[IU]/mL (ref 0.35–4.50)

## 2018-03-08 NOTE — Progress Notes (Signed)
Subjective:  Patient ID: Derek Tucker, male    DOB: [birth year]  Age: 47 y.o. MRN: [id number]  CC: Abdominal Pain and Hypertension   HPI Derek Tucker presents for concerns about RUQ abd that has worsened over the last few months.  He describes the pain as a dull ache and a heavy sensation.  He is status post cholecystectomy about 3 years ago.  He complains of mild fatigue but he denies nausea, vomiting, odynophagia, dysphagia, loss of appetite, or weight loss.  He has a history of fatty liver disease.  Outpatient Medications Prior to Visit  Medication Sig Dispense Refill  . fish oil-omega-3 fatty acids 1000 MG capsule Take 2 g by mouth daily.    . fluticasone (FLONASE) 50 MCG/ACT nasal spray Place 4 sprays into the nose daily. 16 g 11  . fluticasone (FLOVENT HFA) 110 MCG/ACT inhaler Inhale 1 puff into the lungs 2 (two) times daily. (Patient taking differently: Inhale 1 puff into the lungs 2 (two) times daily as needed. ) 1 Inhaler 12  . Spacer/Aero Chamber Mouthpiece MISC 1 Act by Does not apply route 2 (two) times daily. 1 each 11  . VENTOLIN HFA 108 (90 Base) MCG/ACT inhaler INHALE 2 PUFFS INTO THE LUNGS EVERY 6 HOURS AS NEEDED FOR WHEEZING. 18 g 11  . ALPRAZolam (XANAX) 0.25 MG tablet TAKE 1 TABLET BY MOUTH TWICE DAILY AS NEEDED FOR ANXIETY (Patient not taking: Reported on 03/08/2018) 10 tablet 5  . SUMAtriptan-naproxen (TREXIMET) 85-500 MG tablet Take 1 tablet by mouth every 2 (two) hours as needed for migraine. (Patient not taking: Reported on 03/08/2018) 10 tablet 2  . Glucosamine Sulfate-MSM (MSM-GLUCOSAMINE PO) Take by mouth.     No facility-administered medications prior to visit.     ROS Review of Systems  Constitutional: Positive for fatigue. Negative for appetite change, diaphoresis and unexpected weight change.  HENT: Negative for trouble swallowing.   Eyes: Negative for visual disturbance.  Respiratory: Negative for cough, chest tightness, shortness of breath  and wheezing.   Cardiovascular: Negative for chest pain, palpitations and leg swelling.  Gastrointestinal: Positive for abdominal pain. Negative for constipation, diarrhea, nausea and vomiting.  Endocrine: Negative.   Genitourinary: Negative for difficulty urinating, hematuria, scrotal swelling and testicular pain.  Musculoskeletal: Negative.   Skin: Negative.  Negative for rash.  Neurological: Negative.   Hematological: Negative for adenopathy. Does not bruise/bleed easily.  Psychiatric/Behavioral: Negative.     Objective:  BP (!) 138/92 (BP Location: Left Arm, Patient Position: Sitting, Cuff Size: Normal)   Pulse 74   Temp 97.8 F (36.6 C) (Oral)   Resp 16   Ht 5\' 11"  (1.803 m)   Wt 194 lb 8 oz (88.2 kg)   SpO2 97%   BMI 27.13 kg/m   BP Readings from Last 3 Encounters:  03/08/18 (!) 138/92  01/14/18 124/78  01/19/17 128/88    Wt Readings from Last 3 Encounters:  03/08/18 194 lb 8 oz (88.2 kg)  01/14/18 191 lb (86.6 kg)  01/19/17 195 lb (88.5 kg)    Physical Exam  Constitutional: He is oriented to person, place, and time.  Non-toxic appearance. He does not appear ill. No distress.  HENT:  Mouth/Throat: Oropharynx is clear and moist. No oropharyngeal exudate.  Eyes: No scleral icterus.  Neck: Normal range of motion. Neck supple. No JVD present. No thyromegaly present.  Cardiovascular: Normal rate, regular rhythm and normal heart sounds. Exam reveals no gallop.  No murmur heard. Pulmonary/Chest: Effort normal and  breath sounds normal. No stridor. No respiratory distress. He has no rhonchi. He has no rales.  Abdominal: Soft. Bowel sounds are normal. He exhibits no mass. There is no hepatosplenomegaly. There is tenderness in the right upper quadrant. There is no rigidity, no guarding, no CVA tenderness, no tenderness at McBurney's point and negative Murphy's sign. No hernia. Hernia confirmed negative in the ventral area.  Musculoskeletal: Normal range of motion. He exhibits  no edema, tenderness or deformity.  Neurological: He is alert and oriented to person, place, and time.  Skin: Skin is warm and dry. No rash noted.    Lab Results  Component Value Date   WBC 6.7 03/08/2018   HGB 14.8 03/08/2018   HCT 42.2 03/08/2018   PLT 301.0 03/08/2018   GLUCOSE 96 03/08/2018   CHOL 157 01/19/2017   TRIG 278.0 (H) 01/19/2017   HDL 37.10 (L) 01/19/2017   LDLDIRECT 85.0 01/19/2017   LDLCALC 85 06/25/2014   ALT 25 03/08/2018   AST 20 03/08/2018   NA 136 03/08/2018   K 3.8 03/08/2018   CL 99 03/08/2018   CREATININE 0.97 03/08/2018   BUN 10 03/08/2018   CO2 30 03/08/2018   TSH 3.29 03/08/2018   PSA 0.48 06/25/2014    Dg Chest 2 View  Result Date: 01/14/2018 CLINICAL DATA:  Cough. EXAM: CHEST - 2 VIEW COMPARISON:  None. FINDINGS: The heart size and mediastinal contours are within normal limits. Both lungs are clear. No pneumothorax or pleural effusion is noted. The visualized skeletal structures are unremarkable. IMPRESSION: No active cardiopulmonary disease. Electronically Signed   By: Marijo Conception, M.D.   On: 01/14/2018 16:21    2016 -  IMPRESSION: Hepatic steatosis.  4.9 cm cm lesion in the central liver, likely within the posterior aspect of the medial segment left hepatic lobe, favored to reflect focal fatty sparing. Consider MRI abdomen with/without contrast for further characterization as clinically warranted.  Status post cholecystectomy   Assessment & Plan:   Derek was seen today for abdominal pain and hypertension.  Diagnoses and all orders for this visit:  Essential hypertension-he has developed stage I hypertension.  His lab work is negative for secondary causes or endorgan damage.  He is not willing to take an antihypertensive at this time. -     CBC with Differential/Platelet; Future -     Basic metabolic panel; Future -     TSH; Future -     Urinalysis, Routine w reflex microscopic; Future -     VITAMIN D 25 Hydroxy  (Vit-D Deficiency, Fractures); Future  Right upper quadrant abdominal tenderness without rebound tenderness- His liver enzymes are normal.  I have asked him to undergo an MRCP to see if there is suspicion for malignancy in the liver tumor and to see if he has a retained stone in the common bile duct status post cholecystectomy. -     CBC with Differential/Platelet; Future -     Hepatic function panel; Future -     Cancel: MR ABDOMEN WITH MRCP W CONTRAST; Future -     Cancel: CT Abdomen Pelvis W Contrast; Future -     MR ABDOMEN WITH MRCP W CONTRAST; Future  Liver mass- as above -     Cancel: MR ABDOMEN WITH MRCP W CONTRAST; Future -     Cancel: CT Abdomen Pelvis W Contrast; Future -     MR ABDOMEN WITH MRCP W CONTRAST; Future  Hematuria, unspecified type- He is asymptomatic with respect to  this and tells me that he has had benign hematuria before.  At this time we will not pursue the hematuria with a CT scan but will see if anything shows up on the MRI of the abdomen. -     Cancel: CT Abdomen Pelvis W Contrast; Future  Right upper quadrant abdominal pain -     Cancel: CT Abdomen Pelvis W Contrast; Future -     MR ABDOMEN WITH MRCP W CONTRAST; Future   I have discontinued Derek Tucker "Derek Tucker"'s Glucosamine Sulfate-MSM (MSM-GLUCOSAMINE PO). I am also having him maintain his fish oil-omega-3 fatty acids, fluticasone, Spacer/Aero Chamber Mouthpiece, fluticasone, SUMAtriptan-naproxen, ALPRAZolam, and VENTOLIN HFA.  No orders of the defined types were placed in this encounter.    Follow-up: Return in about 6 weeks (around 04/19/2018).  Scarlette Calico, MD

## 2018-03-08 NOTE — Patient Instructions (Signed)

## 2018-03-09 ENCOUNTER — Encounter: Payer: Self-pay | Admitting: Internal Medicine

## 2018-03-09 DIAGNOSIS — R319 Hematuria, unspecified: Secondary | ICD-10-CM | POA: Insufficient documentation

## 2018-03-09 DIAGNOSIS — J3089 Other allergic rhinitis: Secondary | ICD-10-CM | POA: Diagnosis not present

## 2018-03-09 DIAGNOSIS — J3081 Allergic rhinitis due to animal (cat) (dog) hair and dander: Secondary | ICD-10-CM | POA: Diagnosis not present

## 2018-03-09 DIAGNOSIS — J4530 Mild persistent asthma, uncomplicated: Secondary | ICD-10-CM | POA: Diagnosis not present

## 2018-03-09 DIAGNOSIS — J301 Allergic rhinitis due to pollen: Secondary | ICD-10-CM | POA: Diagnosis not present

## 2018-03-09 LAB — VITAMIN D 25 HYDROXY (VIT D DEFICIENCY, FRACTURES): VITD: 44.25 ng/mL (ref 30.00–100.00)

## 2018-03-11 ENCOUNTER — Other Ambulatory Visit: Payer: Self-pay | Admitting: Internal Medicine

## 2018-03-11 DIAGNOSIS — R10811 Right upper quadrant abdominal tenderness: Secondary | ICD-10-CM

## 2018-03-11 DIAGNOSIS — R160 Hepatomegaly, not elsewhere classified: Secondary | ICD-10-CM

## 2018-03-18 DIAGNOSIS — J3081 Allergic rhinitis due to animal (cat) (dog) hair and dander: Secondary | ICD-10-CM | POA: Diagnosis not present

## 2018-03-18 DIAGNOSIS — J301 Allergic rhinitis due to pollen: Secondary | ICD-10-CM | POA: Diagnosis not present

## 2018-03-18 DIAGNOSIS — J3089 Other allergic rhinitis: Secondary | ICD-10-CM | POA: Diagnosis not present

## 2018-03-21 ENCOUNTER — Ambulatory Visit
Admission: RE | Admit: 2018-03-21 | Discharge: 2018-03-21 | Disposition: A | Discharge status: Home / Self Care | Payer: 59 | Source: Ambulatory Visit | Attending: Internal Medicine | Admitting: Internal Medicine

## 2018-03-21 DIAGNOSIS — R932 Abnormal findings on diagnostic imaging of liver and biliary tract: Secondary | ICD-10-CM | POA: Diagnosis not present

## 2018-03-21 DIAGNOSIS — K769 Liver disease, unspecified: Secondary | ICD-10-CM | POA: Diagnosis not present

## 2018-03-21 DIAGNOSIS — R160 Hepatomegaly, not elsewhere classified: Secondary | ICD-10-CM

## 2018-03-21 DIAGNOSIS — R10811 Right upper quadrant abdominal tenderness: Secondary | ICD-10-CM

## 2018-03-21 MED ORDER — GADOBENATE DIMEGLUMINE 529 MG/ML IV SOLN
18.0000 mL | Freq: Once | INTRAVENOUS | Status: AC | PRN
  Administered 2018-03-21: 18 mL via INTRAVENOUS

## 2018-03-22 ENCOUNTER — Encounter: Payer: Self-pay | Admitting: Internal Medicine

## 2018-03-23 DIAGNOSIS — F3181 Bipolar II disorder: Secondary | ICD-10-CM | POA: Diagnosis not present

## 2018-03-23 DIAGNOSIS — F4310 Post-traumatic stress disorder, unspecified: Secondary | ICD-10-CM | POA: Diagnosis not present

## 2018-03-23 DIAGNOSIS — F429 Obsessive-compulsive disorder, unspecified: Secondary | ICD-10-CM | POA: Diagnosis not present

## 2018-03-25 DIAGNOSIS — J3081 Allergic rhinitis due to animal (cat) (dog) hair and dander: Secondary | ICD-10-CM | POA: Diagnosis not present

## 2018-03-25 DIAGNOSIS — J301 Allergic rhinitis due to pollen: Secondary | ICD-10-CM | POA: Diagnosis not present

## 2018-03-25 DIAGNOSIS — J3089 Other allergic rhinitis: Secondary | ICD-10-CM | POA: Diagnosis not present

## 2018-03-30 ENCOUNTER — Encounter: Payer: 59 | Admitting: Internal Medicine

## 2018-03-30 DIAGNOSIS — J301 Allergic rhinitis due to pollen: Secondary | ICD-10-CM | POA: Diagnosis not present

## 2018-03-30 DIAGNOSIS — Z85828 Personal history of other malignant neoplasm of skin: Secondary | ICD-10-CM | POA: Diagnosis not present

## 2018-03-30 DIAGNOSIS — J3081 Allergic rhinitis due to animal (cat) (dog) hair and dander: Secondary | ICD-10-CM | POA: Diagnosis not present

## 2018-03-30 DIAGNOSIS — Z0289 Encounter for other administrative examinations: Secondary | ICD-10-CM

## 2018-03-30 DIAGNOSIS — D179 Benign lipomatous neoplasm, unspecified: Secondary | ICD-10-CM | POA: Diagnosis not present

## 2018-04-06 DIAGNOSIS — F3181 Bipolar II disorder: Secondary | ICD-10-CM | POA: Diagnosis not present

## 2018-04-06 DIAGNOSIS — F42 Obsessive-compulsive disorder: Secondary | ICD-10-CM | POA: Diagnosis not present

## 2018-04-06 DIAGNOSIS — F4310 Post-traumatic stress disorder, unspecified: Secondary | ICD-10-CM | POA: Diagnosis not present

## 2018-04-12 DIAGNOSIS — J3081 Allergic rhinitis due to animal (cat) (dog) hair and dander: Secondary | ICD-10-CM | POA: Diagnosis not present

## 2018-04-12 DIAGNOSIS — J3089 Other allergic rhinitis: Secondary | ICD-10-CM | POA: Diagnosis not present

## 2018-04-12 DIAGNOSIS — J301 Allergic rhinitis due to pollen: Secondary | ICD-10-CM | POA: Diagnosis not present

## 2018-04-18 ENCOUNTER — Telehealth: Payer: Self-pay | Admitting: Internal Medicine

## 2018-04-18 DIAGNOSIS — G43001 Migraine without aura, not intractable, with status migrainosus: Secondary | ICD-10-CM

## 2018-04-18 NOTE — Telephone Encounter (Signed)
Copied from [place] [id number]. Topic: Quick Communication - Rx Refill/Question >> Apr 18, 2018 10:23 AM Bea Graff, NT wrote: Medication: SUMAtriptan-naproxen (TREXIMET) 85-500 MG tablet  Has the patient contacted their pharmacy? Yes.   (Agent: If no, request that the patient contact the pharmacy for the refill.) (Agent: If yes, when and what did the pharmacy advise?)  Preferred Pharmacy (with phone number or street name): [place], [place] - [address] [phone] (Phone) [phone] (Fax)    Agent: Please be advised that RX refills may take up to 3 business days. We ask that you follow-up with your pharmacy.

## 2018-04-18 NOTE — Telephone Encounter (Signed)
Requested medication (s) are due for refill today: yes  Requested medication (s) are on the active medication list: yes  Last refill:  2017  Future visit scheduled: No  Notes to clinic:  Unable to fill per protocol    Requested Prescriptions  Pending Prescriptions Disp Refills   SUMAtriptan-naproxen (TREXIMET) 85-500 MG tablet 10 tablet 2    Sig: Take 1 tablet by mouth every 2 (two) hours as needed for migraine.     Off-Protocol Failed - 04/18/2018 11:54 AM      Failed - Medication not assigned to a protocol, review manually.      Passed - Valid encounter within last 12 months    Recent Outpatient Visits          1 month ago Essential hypertension   [place], MD   3 months ago Mild persistent asthma, unspecified whether complicated   [place] Plot[place], Evie Lacks, MD   1 year ago Hip pain, chronic, unspecified laterality   [facility] -Mayer Camel, MD   1 year ago Acute right-sided low back pain without sciatica   [facility] -Georges Mouse, MD   2 years ago Acute intractable headache, unspecified headache type   [place], Thomas L, MD

## 2018-04-20 MED ORDER — SUMATRIPTAN-NAPROXEN SODIUM 85-500 MG PO TABS: 1.0000 | ORAL_TABLET | ORAL | 2 refills | Status: DC | PRN

## 2018-04-22 DIAGNOSIS — J3081 Allergic rhinitis due to animal (cat) (dog) hair and dander: Secondary | ICD-10-CM | POA: Diagnosis not present

## 2018-04-22 DIAGNOSIS — J3089 Other allergic rhinitis: Secondary | ICD-10-CM | POA: Diagnosis not present

## 2018-04-22 DIAGNOSIS — J301 Allergic rhinitis due to pollen: Secondary | ICD-10-CM | POA: Diagnosis not present

## 2018-05-02 ENCOUNTER — Other Ambulatory Visit: Payer: Self-pay | Admitting: Internal Medicine

## 2018-05-02 DIAGNOSIS — G43001 Migraine without aura, not intractable, with status migrainosus: Secondary | ICD-10-CM

## 2018-05-02 MED ORDER — SUMATRIPTAN SUCCINATE 100 MG PO TABS: 100.0000 mg | ORAL_TABLET | ORAL | 5 refills | Status: DC | PRN

## 2018-05-02 NOTE — Telephone Encounter (Signed)
RX sent to [facility] pharmacy

## 2018-05-02 NOTE — Telephone Encounter (Signed)
Patient called to say that Rx is not covered by his insurance but they will pay for the Sumatriptan. He is asking for an Rx to be sent to the pharmacy for the Sumatriptan please. Please advise Ph# [phone]

## 2018-05-02 NOTE — Telephone Encounter (Signed)
Can the treximet be changed to plain sumatriptan?

## 2018-05-03 NOTE — Telephone Encounter (Signed)
Pt informed rx was sent.  

## 2018-05-04 DIAGNOSIS — J301 Allergic rhinitis due to pollen: Secondary | ICD-10-CM | POA: Diagnosis not present

## 2018-05-04 DIAGNOSIS — J3081 Allergic rhinitis due to animal (cat) (dog) hair and dander: Secondary | ICD-10-CM | POA: Diagnosis not present

## 2018-05-04 DIAGNOSIS — J3089 Other allergic rhinitis: Secondary | ICD-10-CM | POA: Diagnosis not present

## 2018-05-24 DIAGNOSIS — D1722 Benign lipomatous neoplasm of skin and subcutaneous tissue of left arm: Secondary | ICD-10-CM | POA: Diagnosis not present

## 2018-05-24 DIAGNOSIS — D485 Neoplasm of uncertain behavior of skin: Secondary | ICD-10-CM | POA: Diagnosis not present

## 2018-05-29 ENCOUNTER — Other Ambulatory Visit: Payer: Self-pay | Admitting: Internal Medicine

## 2018-05-31 DIAGNOSIS — D179 Benign lipomatous neoplasm, unspecified: Secondary | ICD-10-CM | POA: Diagnosis not present

## 2018-06-10 DIAGNOSIS — J301 Allergic rhinitis due to pollen: Secondary | ICD-10-CM | POA: Diagnosis not present

## 2018-06-10 DIAGNOSIS — J3089 Other allergic rhinitis: Secondary | ICD-10-CM | POA: Diagnosis not present

## 2018-06-10 DIAGNOSIS — J3081 Allergic rhinitis due to animal (cat) (dog) hair and dander: Secondary | ICD-10-CM | POA: Diagnosis not present

## 2018-06-27 DIAGNOSIS — J3089 Other allergic rhinitis: Secondary | ICD-10-CM | POA: Diagnosis not present

## 2018-06-27 DIAGNOSIS — J3081 Allergic rhinitis due to animal (cat) (dog) hair and dander: Secondary | ICD-10-CM | POA: Diagnosis not present

## 2018-06-27 DIAGNOSIS — J301 Allergic rhinitis due to pollen: Secondary | ICD-10-CM | POA: Diagnosis not present

## 2018-06-28 DIAGNOSIS — F4310 Post-traumatic stress disorder, unspecified: Secondary | ICD-10-CM | POA: Diagnosis not present

## 2018-06-28 DIAGNOSIS — F410 Panic disorder [episodic paroxysmal anxiety] without agoraphobia: Secondary | ICD-10-CM | POA: Diagnosis not present

## 2018-06-28 DIAGNOSIS — F3181 Bipolar II disorder: Secondary | ICD-10-CM | POA: Diagnosis not present

## 2018-06-28 DIAGNOSIS — F411 Generalized anxiety disorder: Secondary | ICD-10-CM | POA: Diagnosis not present

## 2018-07-04 DIAGNOSIS — F410 Panic disorder [episodic paroxysmal anxiety] without agoraphobia: Secondary | ICD-10-CM | POA: Diagnosis not present

## 2018-07-04 DIAGNOSIS — F411 Generalized anxiety disorder: Secondary | ICD-10-CM | POA: Diagnosis not present

## 2018-07-04 DIAGNOSIS — F3181 Bipolar II disorder: Secondary | ICD-10-CM | POA: Diagnosis not present

## 2018-07-04 DIAGNOSIS — F4310 Post-traumatic stress disorder, unspecified: Secondary | ICD-10-CM | POA: Diagnosis not present

## 2018-07-06 DIAGNOSIS — F411 Generalized anxiety disorder: Secondary | ICD-10-CM | POA: Diagnosis not present

## 2018-07-06 DIAGNOSIS — F4310 Post-traumatic stress disorder, unspecified: Secondary | ICD-10-CM | POA: Diagnosis not present

## 2018-07-06 DIAGNOSIS — F410 Panic disorder [episodic paroxysmal anxiety] without agoraphobia: Secondary | ICD-10-CM | POA: Diagnosis not present

## 2018-07-06 DIAGNOSIS — F3181 Bipolar II disorder: Secondary | ICD-10-CM | POA: Diagnosis not present

## 2018-07-11 DIAGNOSIS — F3181 Bipolar II disorder: Secondary | ICD-10-CM | POA: Diagnosis not present

## 2018-07-11 DIAGNOSIS — F4310 Post-traumatic stress disorder, unspecified: Secondary | ICD-10-CM | POA: Diagnosis not present

## 2018-07-11 DIAGNOSIS — F411 Generalized anxiety disorder: Secondary | ICD-10-CM | POA: Diagnosis not present

## 2018-07-11 DIAGNOSIS — F410 Panic disorder [episodic paroxysmal anxiety] without agoraphobia: Secondary | ICD-10-CM | POA: Diagnosis not present

## 2018-07-13 ENCOUNTER — Other Ambulatory Visit: Payer: Self-pay | Admitting: Internal Medicine

## 2018-07-14 ENCOUNTER — Encounter: Payer: Self-pay | Admitting: Internal Medicine

## 2018-07-25 DIAGNOSIS — F3181 Bipolar II disorder: Secondary | ICD-10-CM | POA: Diagnosis not present

## 2018-07-25 DIAGNOSIS — F411 Generalized anxiety disorder: Secondary | ICD-10-CM | POA: Diagnosis not present

## 2018-07-25 DIAGNOSIS — F410 Panic disorder [episodic paroxysmal anxiety] without agoraphobia: Secondary | ICD-10-CM | POA: Diagnosis not present

## 2018-07-25 DIAGNOSIS — F4310 Post-traumatic stress disorder, unspecified: Secondary | ICD-10-CM | POA: Diagnosis not present

## 2018-07-26 DIAGNOSIS — J301 Allergic rhinitis due to pollen: Secondary | ICD-10-CM | POA: Diagnosis not present

## 2018-07-26 DIAGNOSIS — J3081 Allergic rhinitis due to animal (cat) (dog) hair and dander: Secondary | ICD-10-CM | POA: Diagnosis not present

## 2018-07-26 DIAGNOSIS — J3089 Other allergic rhinitis: Secondary | ICD-10-CM | POA: Diagnosis not present

## 2018-08-01 DIAGNOSIS — F411 Generalized anxiety disorder: Secondary | ICD-10-CM | POA: Diagnosis not present

## 2018-08-01 DIAGNOSIS — F4310 Post-traumatic stress disorder, unspecified: Secondary | ICD-10-CM | POA: Diagnosis not present

## 2018-08-01 DIAGNOSIS — F410 Panic disorder [episodic paroxysmal anxiety] without agoraphobia: Secondary | ICD-10-CM | POA: Diagnosis not present

## 2018-08-01 DIAGNOSIS — F3181 Bipolar II disorder: Secondary | ICD-10-CM | POA: Diagnosis not present

## 2018-08-02 DIAGNOSIS — F4310 Post-traumatic stress disorder, unspecified: Secondary | ICD-10-CM | POA: Diagnosis not present

## 2018-08-02 DIAGNOSIS — F411 Generalized anxiety disorder: Secondary | ICD-10-CM | POA: Diagnosis not present

## 2018-08-02 DIAGNOSIS — F3181 Bipolar II disorder: Secondary | ICD-10-CM | POA: Diagnosis not present

## 2018-08-02 DIAGNOSIS — F410 Panic disorder [episodic paroxysmal anxiety] without agoraphobia: Secondary | ICD-10-CM | POA: Diagnosis not present

## 2018-08-09 DIAGNOSIS — F3181 Bipolar II disorder: Secondary | ICD-10-CM | POA: Diagnosis not present

## 2018-08-09 DIAGNOSIS — F410 Panic disorder [episodic paroxysmal anxiety] without agoraphobia: Secondary | ICD-10-CM | POA: Diagnosis not present

## 2018-08-09 DIAGNOSIS — F4310 Post-traumatic stress disorder, unspecified: Secondary | ICD-10-CM | POA: Diagnosis not present

## 2018-08-09 DIAGNOSIS — F411 Generalized anxiety disorder: Secondary | ICD-10-CM | POA: Diagnosis not present

## 2018-08-10 DIAGNOSIS — J3089 Other allergic rhinitis: Secondary | ICD-10-CM | POA: Diagnosis not present

## 2018-08-10 DIAGNOSIS — J301 Allergic rhinitis due to pollen: Secondary | ICD-10-CM | POA: Diagnosis not present

## 2018-08-10 DIAGNOSIS — J3081 Allergic rhinitis due to animal (cat) (dog) hair and dander: Secondary | ICD-10-CM | POA: Diagnosis not present

## 2018-08-23 DIAGNOSIS — F4310 Post-traumatic stress disorder, unspecified: Secondary | ICD-10-CM | POA: Diagnosis not present

## 2018-08-23 DIAGNOSIS — J3089 Other allergic rhinitis: Secondary | ICD-10-CM | POA: Diagnosis not present

## 2018-08-23 DIAGNOSIS — J3081 Allergic rhinitis due to animal (cat) (dog) hair and dander: Secondary | ICD-10-CM | POA: Diagnosis not present

## 2018-08-23 DIAGNOSIS — F410 Panic disorder [episodic paroxysmal anxiety] without agoraphobia: Secondary | ICD-10-CM | POA: Diagnosis not present

## 2018-08-23 DIAGNOSIS — J301 Allergic rhinitis due to pollen: Secondary | ICD-10-CM | POA: Diagnosis not present

## 2018-08-23 DIAGNOSIS — F411 Generalized anxiety disorder: Secondary | ICD-10-CM | POA: Diagnosis not present

## 2018-08-23 DIAGNOSIS — F3181 Bipolar II disorder: Secondary | ICD-10-CM | POA: Diagnosis not present

## 2018-08-26 ENCOUNTER — Other Ambulatory Visit: Payer: Self-pay | Admitting: Internal Medicine

## 2018-08-30 DIAGNOSIS — J301 Allergic rhinitis due to pollen: Secondary | ICD-10-CM | POA: Diagnosis not present

## 2018-08-30 DIAGNOSIS — J3081 Allergic rhinitis due to animal (cat) (dog) hair and dander: Secondary | ICD-10-CM | POA: Diagnosis not present

## 2018-08-30 DIAGNOSIS — J3089 Other allergic rhinitis: Secondary | ICD-10-CM | POA: Diagnosis not present

## 2018-09-06 DIAGNOSIS — J3081 Allergic rhinitis due to animal (cat) (dog) hair and dander: Secondary | ICD-10-CM | POA: Diagnosis not present

## 2018-09-06 DIAGNOSIS — J3089 Other allergic rhinitis: Secondary | ICD-10-CM | POA: Diagnosis not present

## 2018-09-06 DIAGNOSIS — J301 Allergic rhinitis due to pollen: Secondary | ICD-10-CM | POA: Diagnosis not present

## 2018-09-13 ENCOUNTER — Encounter: Payer: Self-pay | Admitting: Internal Medicine

## 2018-09-13 DIAGNOSIS — J3081 Allergic rhinitis due to animal (cat) (dog) hair and dander: Secondary | ICD-10-CM | POA: Diagnosis not present

## 2018-09-13 DIAGNOSIS — J301 Allergic rhinitis due to pollen: Secondary | ICD-10-CM | POA: Diagnosis not present

## 2018-09-13 DIAGNOSIS — J3089 Other allergic rhinitis: Secondary | ICD-10-CM | POA: Diagnosis not present

## 2018-09-14 ENCOUNTER — Encounter: Payer: Self-pay | Admitting: Internal Medicine

## 2018-09-19 DIAGNOSIS — J301 Allergic rhinitis due to pollen: Secondary | ICD-10-CM | POA: Diagnosis not present

## 2018-09-19 DIAGNOSIS — J3081 Allergic rhinitis due to animal (cat) (dog) hair and dander: Secondary | ICD-10-CM | POA: Diagnosis not present

## 2018-09-19 DIAGNOSIS — J3089 Other allergic rhinitis: Secondary | ICD-10-CM | POA: Diagnosis not present

## 2018-09-19 DIAGNOSIS — H1045 Other chronic allergic conjunctivitis: Secondary | ICD-10-CM | POA: Diagnosis not present

## 2018-09-22 DIAGNOSIS — J3081 Allergic rhinitis due to animal (cat) (dog) hair and dander: Secondary | ICD-10-CM | POA: Diagnosis not present

## 2018-09-22 DIAGNOSIS — J3089 Other allergic rhinitis: Secondary | ICD-10-CM | POA: Diagnosis not present

## 2018-09-22 DIAGNOSIS — J301 Allergic rhinitis due to pollen: Secondary | ICD-10-CM | POA: Diagnosis not present

## 2018-09-28 DIAGNOSIS — J3089 Other allergic rhinitis: Secondary | ICD-10-CM | POA: Diagnosis not present

## 2018-09-28 DIAGNOSIS — J3081 Allergic rhinitis due to animal (cat) (dog) hair and dander: Secondary | ICD-10-CM | POA: Diagnosis not present

## 2018-09-28 DIAGNOSIS — J301 Allergic rhinitis due to pollen: Secondary | ICD-10-CM | POA: Diagnosis not present

## 2018-10-07 DIAGNOSIS — J3089 Other allergic rhinitis: Secondary | ICD-10-CM | POA: Diagnosis not present

## 2018-10-07 DIAGNOSIS — J3081 Allergic rhinitis due to animal (cat) (dog) hair and dander: Secondary | ICD-10-CM | POA: Diagnosis not present

## 2018-10-07 DIAGNOSIS — J301 Allergic rhinitis due to pollen: Secondary | ICD-10-CM | POA: Diagnosis not present

## 2018-10-13 DIAGNOSIS — J301 Allergic rhinitis due to pollen: Secondary | ICD-10-CM | POA: Diagnosis not present

## 2018-10-13 DIAGNOSIS — J3081 Allergic rhinitis due to animal (cat) (dog) hair and dander: Secondary | ICD-10-CM | POA: Diagnosis not present

## 2018-10-13 DIAGNOSIS — J3089 Other allergic rhinitis: Secondary | ICD-10-CM | POA: Diagnosis not present

## 2018-10-21 DIAGNOSIS — J301 Allergic rhinitis due to pollen: Secondary | ICD-10-CM | POA: Diagnosis not present

## 2018-10-21 DIAGNOSIS — J3089 Other allergic rhinitis: Secondary | ICD-10-CM | POA: Diagnosis not present

## 2018-10-21 DIAGNOSIS — J3081 Allergic rhinitis due to animal (cat) (dog) hair and dander: Secondary | ICD-10-CM | POA: Diagnosis not present

## 2018-10-26 DIAGNOSIS — J3089 Other allergic rhinitis: Secondary | ICD-10-CM | POA: Diagnosis not present

## 2018-10-26 DIAGNOSIS — J301 Allergic rhinitis due to pollen: Secondary | ICD-10-CM | POA: Diagnosis not present

## 2018-10-26 DIAGNOSIS — J3081 Allergic rhinitis due to animal (cat) (dog) hair and dander: Secondary | ICD-10-CM | POA: Diagnosis not present

## 2018-10-31 DIAGNOSIS — J3089 Other allergic rhinitis: Secondary | ICD-10-CM | POA: Diagnosis not present

## 2018-10-31 DIAGNOSIS — J301 Allergic rhinitis due to pollen: Secondary | ICD-10-CM | POA: Diagnosis not present

## 2018-10-31 DIAGNOSIS — J3081 Allergic rhinitis due to animal (cat) (dog) hair and dander: Secondary | ICD-10-CM | POA: Diagnosis not present

## 2018-11-09 ENCOUNTER — Other Ambulatory Visit: Payer: Self-pay | Admitting: Internal Medicine

## 2018-11-22 ENCOUNTER — Other Ambulatory Visit: Payer: Self-pay | Admitting: Internal Medicine

## 2018-12-02 DIAGNOSIS — J3081 Allergic rhinitis due to animal (cat) (dog) hair and dander: Secondary | ICD-10-CM | POA: Diagnosis not present

## 2018-12-02 DIAGNOSIS — J3089 Other allergic rhinitis: Secondary | ICD-10-CM | POA: Diagnosis not present

## 2018-12-02 DIAGNOSIS — J301 Allergic rhinitis due to pollen: Secondary | ICD-10-CM | POA: Diagnosis not present

## 2018-12-09 DIAGNOSIS — J3081 Allergic rhinitis due to animal (cat) (dog) hair and dander: Secondary | ICD-10-CM | POA: Diagnosis not present

## 2018-12-09 DIAGNOSIS — J3089 Other allergic rhinitis: Secondary | ICD-10-CM | POA: Diagnosis not present

## 2018-12-09 DIAGNOSIS — J301 Allergic rhinitis due to pollen: Secondary | ICD-10-CM | POA: Diagnosis not present

## 2018-12-21 DIAGNOSIS — J301 Allergic rhinitis due to pollen: Secondary | ICD-10-CM | POA: Diagnosis not present

## 2018-12-21 DIAGNOSIS — J3089 Other allergic rhinitis: Secondary | ICD-10-CM | POA: Diagnosis not present

## 2018-12-21 DIAGNOSIS — J3081 Allergic rhinitis due to animal (cat) (dog) hair and dander: Secondary | ICD-10-CM | POA: Diagnosis not present

## 2018-12-25 ENCOUNTER — Other Ambulatory Visit: Payer: Self-pay | Admitting: Internal Medicine

## 2018-12-26 DIAGNOSIS — F410 Panic disorder [episodic paroxysmal anxiety] without agoraphobia: Secondary | ICD-10-CM | POA: Diagnosis not present

## 2018-12-26 DIAGNOSIS — F3181 Bipolar II disorder: Secondary | ICD-10-CM | POA: Diagnosis not present

## 2018-12-26 DIAGNOSIS — F4310 Post-traumatic stress disorder, unspecified: Secondary | ICD-10-CM | POA: Diagnosis not present

## 2018-12-26 DIAGNOSIS — F411 Generalized anxiety disorder: Secondary | ICD-10-CM | POA: Diagnosis not present

## 2018-12-27 DIAGNOSIS — J301 Allergic rhinitis due to pollen: Secondary | ICD-10-CM | POA: Diagnosis not present

## 2018-12-27 DIAGNOSIS — J3089 Other allergic rhinitis: Secondary | ICD-10-CM | POA: Diagnosis not present

## 2018-12-27 DIAGNOSIS — J3081 Allergic rhinitis due to animal (cat) (dog) hair and dander: Secondary | ICD-10-CM | POA: Diagnosis not present

## 2018-12-28 DIAGNOSIS — F3181 Bipolar II disorder: Secondary | ICD-10-CM | POA: Diagnosis not present

## 2018-12-28 DIAGNOSIS — F4310 Post-traumatic stress disorder, unspecified: Secondary | ICD-10-CM | POA: Diagnosis not present

## 2018-12-28 DIAGNOSIS — F411 Generalized anxiety disorder: Secondary | ICD-10-CM | POA: Diagnosis not present

## 2018-12-28 DIAGNOSIS — F410 Panic disorder [episodic paroxysmal anxiety] without agoraphobia: Secondary | ICD-10-CM | POA: Diagnosis not present

## 2019-01-02 DIAGNOSIS — F410 Panic disorder [episodic paroxysmal anxiety] without agoraphobia: Secondary | ICD-10-CM | POA: Diagnosis not present

## 2019-01-02 DIAGNOSIS — F4310 Post-traumatic stress disorder, unspecified: Secondary | ICD-10-CM | POA: Diagnosis not present

## 2019-01-02 DIAGNOSIS — F3181 Bipolar II disorder: Secondary | ICD-10-CM | POA: Diagnosis not present

## 2019-01-02 DIAGNOSIS — F411 Generalized anxiety disorder: Secondary | ICD-10-CM | POA: Diagnosis not present

## 2019-01-04 DIAGNOSIS — F410 Panic disorder [episodic paroxysmal anxiety] without agoraphobia: Secondary | ICD-10-CM | POA: Diagnosis not present

## 2019-01-04 DIAGNOSIS — F4310 Post-traumatic stress disorder, unspecified: Secondary | ICD-10-CM | POA: Diagnosis not present

## 2019-01-04 DIAGNOSIS — F411 Generalized anxiety disorder: Secondary | ICD-10-CM | POA: Diagnosis not present

## 2019-01-04 DIAGNOSIS — F3181 Bipolar II disorder: Secondary | ICD-10-CM | POA: Diagnosis not present

## 2019-01-09 DIAGNOSIS — F410 Panic disorder [episodic paroxysmal anxiety] without agoraphobia: Secondary | ICD-10-CM | POA: Diagnosis not present

## 2019-01-09 DIAGNOSIS — F4310 Post-traumatic stress disorder, unspecified: Secondary | ICD-10-CM | POA: Diagnosis not present

## 2019-01-09 DIAGNOSIS — F3181 Bipolar II disorder: Secondary | ICD-10-CM | POA: Diagnosis not present

## 2019-01-09 DIAGNOSIS — F411 Generalized anxiety disorder: Secondary | ICD-10-CM | POA: Diagnosis not present

## 2019-01-10 DIAGNOSIS — J3081 Allergic rhinitis due to animal (cat) (dog) hair and dander: Secondary | ICD-10-CM | POA: Diagnosis not present

## 2019-01-10 DIAGNOSIS — J301 Allergic rhinitis due to pollen: Secondary | ICD-10-CM | POA: Diagnosis not present

## 2019-01-10 DIAGNOSIS — J3089 Other allergic rhinitis: Secondary | ICD-10-CM | POA: Diagnosis not present

## 2019-01-11 DIAGNOSIS — F4310 Post-traumatic stress disorder, unspecified: Secondary | ICD-10-CM | POA: Diagnosis not present

## 2019-01-11 DIAGNOSIS — F410 Panic disorder [episodic paroxysmal anxiety] without agoraphobia: Secondary | ICD-10-CM | POA: Diagnosis not present

## 2019-01-11 DIAGNOSIS — F3181 Bipolar II disorder: Secondary | ICD-10-CM | POA: Diagnosis not present

## 2019-01-11 DIAGNOSIS — J3081 Allergic rhinitis due to animal (cat) (dog) hair and dander: Secondary | ICD-10-CM | POA: Diagnosis not present

## 2019-01-11 DIAGNOSIS — J301 Allergic rhinitis due to pollen: Secondary | ICD-10-CM | POA: Diagnosis not present

## 2019-01-11 DIAGNOSIS — F411 Generalized anxiety disorder: Secondary | ICD-10-CM | POA: Diagnosis not present

## 2019-01-12 DIAGNOSIS — J3089 Other allergic rhinitis: Secondary | ICD-10-CM | POA: Diagnosis not present

## 2019-01-16 DIAGNOSIS — F410 Panic disorder [episodic paroxysmal anxiety] without agoraphobia: Secondary | ICD-10-CM | POA: Diagnosis not present

## 2019-01-16 DIAGNOSIS — F4310 Post-traumatic stress disorder, unspecified: Secondary | ICD-10-CM | POA: Diagnosis not present

## 2019-01-16 DIAGNOSIS — F411 Generalized anxiety disorder: Secondary | ICD-10-CM | POA: Diagnosis not present

## 2019-01-16 DIAGNOSIS — F3181 Bipolar II disorder: Secondary | ICD-10-CM | POA: Diagnosis not present

## 2019-01-17 DIAGNOSIS — F411 Generalized anxiety disorder: Secondary | ICD-10-CM | POA: Diagnosis not present

## 2019-01-17 DIAGNOSIS — F410 Panic disorder [episodic paroxysmal anxiety] without agoraphobia: Secondary | ICD-10-CM | POA: Diagnosis not present

## 2019-01-17 DIAGNOSIS — F3181 Bipolar II disorder: Secondary | ICD-10-CM | POA: Diagnosis not present

## 2019-01-17 DIAGNOSIS — F4310 Post-traumatic stress disorder, unspecified: Secondary | ICD-10-CM | POA: Diagnosis not present

## 2019-01-18 DIAGNOSIS — F411 Generalized anxiety disorder: Secondary | ICD-10-CM | POA: Diagnosis not present

## 2019-01-18 DIAGNOSIS — F410 Panic disorder [episodic paroxysmal anxiety] without agoraphobia: Secondary | ICD-10-CM | POA: Diagnosis not present

## 2019-01-18 DIAGNOSIS — F3181 Bipolar II disorder: Secondary | ICD-10-CM | POA: Diagnosis not present

## 2019-01-18 DIAGNOSIS — F4310 Post-traumatic stress disorder, unspecified: Secondary | ICD-10-CM | POA: Diagnosis not present

## 2019-01-23 DIAGNOSIS — J3089 Other allergic rhinitis: Secondary | ICD-10-CM | POA: Diagnosis not present

## 2019-01-23 DIAGNOSIS — J3081 Allergic rhinitis due to animal (cat) (dog) hair and dander: Secondary | ICD-10-CM | POA: Diagnosis not present

## 2019-01-23 DIAGNOSIS — J301 Allergic rhinitis due to pollen: Secondary | ICD-10-CM | POA: Diagnosis not present

## 2019-01-25 DIAGNOSIS — F4310 Post-traumatic stress disorder, unspecified: Secondary | ICD-10-CM | POA: Diagnosis not present

## 2019-01-25 DIAGNOSIS — F410 Panic disorder [episodic paroxysmal anxiety] without agoraphobia: Secondary | ICD-10-CM | POA: Diagnosis not present

## 2019-01-25 DIAGNOSIS — F411 Generalized anxiety disorder: Secondary | ICD-10-CM | POA: Diagnosis not present

## 2019-01-25 DIAGNOSIS — F3181 Bipolar II disorder: Secondary | ICD-10-CM | POA: Diagnosis not present

## 2019-01-28 ENCOUNTER — Other Ambulatory Visit: Payer: Self-pay | Admitting: Internal Medicine

## 2019-02-01 DIAGNOSIS — F411 Generalized anxiety disorder: Secondary | ICD-10-CM | POA: Diagnosis not present

## 2019-02-01 DIAGNOSIS — F4310 Post-traumatic stress disorder, unspecified: Secondary | ICD-10-CM | POA: Diagnosis not present

## 2019-02-01 DIAGNOSIS — F410 Panic disorder [episodic paroxysmal anxiety] without agoraphobia: Secondary | ICD-10-CM | POA: Diagnosis not present

## 2019-02-01 DIAGNOSIS — F3181 Bipolar II disorder: Secondary | ICD-10-CM | POA: Diagnosis not present

## 2019-02-03 DIAGNOSIS — J301 Allergic rhinitis due to pollen: Secondary | ICD-10-CM | POA: Diagnosis not present

## 2019-02-03 DIAGNOSIS — J3089 Other allergic rhinitis: Secondary | ICD-10-CM | POA: Diagnosis not present

## 2019-02-03 DIAGNOSIS — J3081 Allergic rhinitis due to animal (cat) (dog) hair and dander: Secondary | ICD-10-CM | POA: Diagnosis not present

## 2019-02-08 DIAGNOSIS — F410 Panic disorder [episodic paroxysmal anxiety] without agoraphobia: Secondary | ICD-10-CM | POA: Diagnosis not present

## 2019-02-08 DIAGNOSIS — F3181 Bipolar II disorder: Secondary | ICD-10-CM | POA: Diagnosis not present

## 2019-02-08 DIAGNOSIS — F4310 Post-traumatic stress disorder, unspecified: Secondary | ICD-10-CM | POA: Diagnosis not present

## 2019-02-08 DIAGNOSIS — F411 Generalized anxiety disorder: Secondary | ICD-10-CM | POA: Diagnosis not present

## 2019-02-13 DIAGNOSIS — J3081 Allergic rhinitis due to animal (cat) (dog) hair and dander: Secondary | ICD-10-CM | POA: Diagnosis not present

## 2019-02-13 DIAGNOSIS — J301 Allergic rhinitis due to pollen: Secondary | ICD-10-CM | POA: Diagnosis not present

## 2019-02-13 DIAGNOSIS — J3089 Other allergic rhinitis: Secondary | ICD-10-CM | POA: Diagnosis not present

## 2019-02-13 DIAGNOSIS — N410 Acute prostatitis: Secondary | ICD-10-CM | POA: Diagnosis not present

## 2019-02-15 DIAGNOSIS — F411 Generalized anxiety disorder: Secondary | ICD-10-CM | POA: Diagnosis not present

## 2019-02-15 DIAGNOSIS — F410 Panic disorder [episodic paroxysmal anxiety] without agoraphobia: Secondary | ICD-10-CM | POA: Diagnosis not present

## 2019-02-15 DIAGNOSIS — F3181 Bipolar II disorder: Secondary | ICD-10-CM | POA: Diagnosis not present

## 2019-02-15 DIAGNOSIS — F4310 Post-traumatic stress disorder, unspecified: Secondary | ICD-10-CM | POA: Diagnosis not present

## 2019-02-17 DIAGNOSIS — F4310 Post-traumatic stress disorder, unspecified: Secondary | ICD-10-CM | POA: Diagnosis not present

## 2019-02-17 DIAGNOSIS — F411 Generalized anxiety disorder: Secondary | ICD-10-CM | POA: Diagnosis not present

## 2019-02-17 DIAGNOSIS — F410 Panic disorder [episodic paroxysmal anxiety] without agoraphobia: Secondary | ICD-10-CM | POA: Diagnosis not present

## 2019-02-17 DIAGNOSIS — F3181 Bipolar II disorder: Secondary | ICD-10-CM | POA: Diagnosis not present

## 2019-02-21 DIAGNOSIS — J3081 Allergic rhinitis due to animal (cat) (dog) hair and dander: Secondary | ICD-10-CM | POA: Diagnosis not present

## 2019-02-21 DIAGNOSIS — J301 Allergic rhinitis due to pollen: Secondary | ICD-10-CM | POA: Diagnosis not present

## 2019-02-21 DIAGNOSIS — J3089 Other allergic rhinitis: Secondary | ICD-10-CM | POA: Diagnosis not present

## 2019-02-22 DIAGNOSIS — F3181 Bipolar II disorder: Secondary | ICD-10-CM | POA: Diagnosis not present

## 2019-02-22 DIAGNOSIS — F410 Panic disorder [episodic paroxysmal anxiety] without agoraphobia: Secondary | ICD-10-CM | POA: Diagnosis not present

## 2019-02-22 DIAGNOSIS — F4310 Post-traumatic stress disorder, unspecified: Secondary | ICD-10-CM | POA: Diagnosis not present

## 2019-02-22 DIAGNOSIS — F411 Generalized anxiety disorder: Secondary | ICD-10-CM | POA: Diagnosis not present

## 2019-02-28 DIAGNOSIS — J3081 Allergic rhinitis due to animal (cat) (dog) hair and dander: Secondary | ICD-10-CM | POA: Diagnosis not present

## 2019-02-28 DIAGNOSIS — J3089 Other allergic rhinitis: Secondary | ICD-10-CM | POA: Diagnosis not present

## 2019-02-28 DIAGNOSIS — J301 Allergic rhinitis due to pollen: Secondary | ICD-10-CM | POA: Diagnosis not present

## 2019-03-01 ENCOUNTER — Other Ambulatory Visit: Payer: Self-pay | Admitting: Internal Medicine

## 2019-03-01 DIAGNOSIS — J4530 Mild persistent asthma, uncomplicated: Secondary | ICD-10-CM

## 2019-03-01 DIAGNOSIS — F4310 Post-traumatic stress disorder, unspecified: Secondary | ICD-10-CM | POA: Diagnosis not present

## 2019-03-01 DIAGNOSIS — F410 Panic disorder [episodic paroxysmal anxiety] without agoraphobia: Secondary | ICD-10-CM | POA: Diagnosis not present

## 2019-03-01 DIAGNOSIS — F411 Generalized anxiety disorder: Secondary | ICD-10-CM | POA: Diagnosis not present

## 2019-03-01 DIAGNOSIS — F3181 Bipolar II disorder: Secondary | ICD-10-CM | POA: Diagnosis not present

## 2019-03-07 DIAGNOSIS — J3089 Other allergic rhinitis: Secondary | ICD-10-CM | POA: Diagnosis not present

## 2019-03-07 DIAGNOSIS — J3081 Allergic rhinitis due to animal (cat) (dog) hair and dander: Secondary | ICD-10-CM | POA: Diagnosis not present

## 2019-03-07 DIAGNOSIS — J301 Allergic rhinitis due to pollen: Secondary | ICD-10-CM | POA: Diagnosis not present

## 2019-03-08 DIAGNOSIS — F4310 Post-traumatic stress disorder, unspecified: Secondary | ICD-10-CM | POA: Diagnosis not present

## 2019-03-08 DIAGNOSIS — F411 Generalized anxiety disorder: Secondary | ICD-10-CM | POA: Diagnosis not present

## 2019-03-08 DIAGNOSIS — F410 Panic disorder [episodic paroxysmal anxiety] without agoraphobia: Secondary | ICD-10-CM | POA: Diagnosis not present

## 2019-03-08 DIAGNOSIS — F3181 Bipolar II disorder: Secondary | ICD-10-CM | POA: Diagnosis not present

## 2019-03-15 DIAGNOSIS — F411 Generalized anxiety disorder: Secondary | ICD-10-CM | POA: Diagnosis not present

## 2019-03-15 DIAGNOSIS — F3181 Bipolar II disorder: Secondary | ICD-10-CM | POA: Diagnosis not present

## 2019-03-15 DIAGNOSIS — F4310 Post-traumatic stress disorder, unspecified: Secondary | ICD-10-CM | POA: Diagnosis not present

## 2019-03-15 DIAGNOSIS — F410 Panic disorder [episodic paroxysmal anxiety] without agoraphobia: Secondary | ICD-10-CM | POA: Diagnosis not present

## 2019-03-21 DIAGNOSIS — F3181 Bipolar II disorder: Secondary | ICD-10-CM | POA: Diagnosis not present

## 2019-03-21 DIAGNOSIS — F4310 Post-traumatic stress disorder, unspecified: Secondary | ICD-10-CM | POA: Diagnosis not present

## 2019-03-21 DIAGNOSIS — F411 Generalized anxiety disorder: Secondary | ICD-10-CM | POA: Diagnosis not present

## 2019-03-21 DIAGNOSIS — F410 Panic disorder [episodic paroxysmal anxiety] without agoraphobia: Secondary | ICD-10-CM | POA: Diagnosis not present

## 2019-03-22 DIAGNOSIS — J301 Allergic rhinitis due to pollen: Secondary | ICD-10-CM | POA: Diagnosis not present

## 2019-03-22 DIAGNOSIS — J3089 Other allergic rhinitis: Secondary | ICD-10-CM | POA: Diagnosis not present

## 2019-03-22 DIAGNOSIS — J3081 Allergic rhinitis due to animal (cat) (dog) hair and dander: Secondary | ICD-10-CM | POA: Diagnosis not present

## 2019-03-27 DIAGNOSIS — J301 Allergic rhinitis due to pollen: Secondary | ICD-10-CM | POA: Diagnosis not present

## 2019-03-27 DIAGNOSIS — J3089 Other allergic rhinitis: Secondary | ICD-10-CM | POA: Diagnosis not present

## 2019-03-27 DIAGNOSIS — H1045 Other chronic allergic conjunctivitis: Secondary | ICD-10-CM | POA: Diagnosis not present

## 2019-03-27 DIAGNOSIS — J3081 Allergic rhinitis due to animal (cat) (dog) hair and dander: Secondary | ICD-10-CM | POA: Diagnosis not present

## 2019-03-29 DIAGNOSIS — F410 Panic disorder [episodic paroxysmal anxiety] without agoraphobia: Secondary | ICD-10-CM | POA: Diagnosis not present

## 2019-03-29 DIAGNOSIS — F411 Generalized anxiety disorder: Secondary | ICD-10-CM | POA: Diagnosis not present

## 2019-03-29 DIAGNOSIS — F3181 Bipolar II disorder: Secondary | ICD-10-CM | POA: Diagnosis not present

## 2019-03-29 DIAGNOSIS — F4310 Post-traumatic stress disorder, unspecified: Secondary | ICD-10-CM | POA: Diagnosis not present

## 2019-04-12 DIAGNOSIS — F4310 Post-traumatic stress disorder, unspecified: Secondary | ICD-10-CM | POA: Diagnosis not present

## 2019-04-12 DIAGNOSIS — F411 Generalized anxiety disorder: Secondary | ICD-10-CM | POA: Diagnosis not present

## 2019-04-12 DIAGNOSIS — F410 Panic disorder [episodic paroxysmal anxiety] without agoraphobia: Secondary | ICD-10-CM | POA: Diagnosis not present

## 2019-04-12 DIAGNOSIS — F3181 Bipolar II disorder: Secondary | ICD-10-CM | POA: Diagnosis not present

## 2019-04-18 DIAGNOSIS — J301 Allergic rhinitis due to pollen: Secondary | ICD-10-CM | POA: Diagnosis not present

## 2019-04-18 DIAGNOSIS — J3089 Other allergic rhinitis: Secondary | ICD-10-CM | POA: Diagnosis not present

## 2019-04-18 DIAGNOSIS — J3081 Allergic rhinitis due to animal (cat) (dog) hair and dander: Secondary | ICD-10-CM | POA: Diagnosis not present

## 2019-04-24 DIAGNOSIS — N410 Acute prostatitis: Secondary | ICD-10-CM | POA: Diagnosis not present

## 2019-04-26 DIAGNOSIS — F410 Panic disorder [episodic paroxysmal anxiety] without agoraphobia: Secondary | ICD-10-CM | POA: Diagnosis not present

## 2019-04-26 DIAGNOSIS — F3181 Bipolar II disorder: Secondary | ICD-10-CM | POA: Diagnosis not present

## 2019-04-26 DIAGNOSIS — F411 Generalized anxiety disorder: Secondary | ICD-10-CM | POA: Diagnosis not present

## 2019-04-26 DIAGNOSIS — F4310 Post-traumatic stress disorder, unspecified: Secondary | ICD-10-CM | POA: Diagnosis not present

## 2019-05-02 DIAGNOSIS — J3089 Other allergic rhinitis: Secondary | ICD-10-CM | POA: Diagnosis not present

## 2019-05-02 DIAGNOSIS — J301 Allergic rhinitis due to pollen: Secondary | ICD-10-CM | POA: Diagnosis not present

## 2019-05-02 DIAGNOSIS — J3081 Allergic rhinitis due to animal (cat) (dog) hair and dander: Secondary | ICD-10-CM | POA: Diagnosis not present

## 2019-05-08 DIAGNOSIS — J3089 Other allergic rhinitis: Secondary | ICD-10-CM | POA: Diagnosis not present

## 2019-05-08 DIAGNOSIS — J3081 Allergic rhinitis due to animal (cat) (dog) hair and dander: Secondary | ICD-10-CM | POA: Diagnosis not present

## 2019-05-08 DIAGNOSIS — J301 Allergic rhinitis due to pollen: Secondary | ICD-10-CM | POA: Diagnosis not present

## 2019-05-10 DIAGNOSIS — F410 Panic disorder [episodic paroxysmal anxiety] without agoraphobia: Secondary | ICD-10-CM | POA: Diagnosis not present

## 2019-05-10 DIAGNOSIS — F411 Generalized anxiety disorder: Secondary | ICD-10-CM | POA: Diagnosis not present

## 2019-05-10 DIAGNOSIS — F3181 Bipolar II disorder: Secondary | ICD-10-CM | POA: Diagnosis not present

## 2019-05-10 DIAGNOSIS — F4310 Post-traumatic stress disorder, unspecified: Secondary | ICD-10-CM | POA: Diagnosis not present

## 2019-05-17 DIAGNOSIS — F4310 Post-traumatic stress disorder, unspecified: Secondary | ICD-10-CM | POA: Diagnosis not present

## 2019-05-17 DIAGNOSIS — F411 Generalized anxiety disorder: Secondary | ICD-10-CM | POA: Diagnosis not present

## 2019-05-17 DIAGNOSIS — F3181 Bipolar II disorder: Secondary | ICD-10-CM | POA: Diagnosis not present

## 2019-05-17 DIAGNOSIS — F410 Panic disorder [episodic paroxysmal anxiety] without agoraphobia: Secondary | ICD-10-CM | POA: Diagnosis not present

## 2019-05-19 DIAGNOSIS — F411 Generalized anxiety disorder: Secondary | ICD-10-CM | POA: Diagnosis not present

## 2019-05-19 DIAGNOSIS — J3089 Other allergic rhinitis: Secondary | ICD-10-CM | POA: Diagnosis not present

## 2019-05-19 DIAGNOSIS — J301 Allergic rhinitis due to pollen: Secondary | ICD-10-CM | POA: Diagnosis not present

## 2019-05-19 DIAGNOSIS — F3181 Bipolar II disorder: Secondary | ICD-10-CM | POA: Diagnosis not present

## 2019-05-19 DIAGNOSIS — J3081 Allergic rhinitis due to animal (cat) (dog) hair and dander: Secondary | ICD-10-CM | POA: Diagnosis not present

## 2019-05-19 DIAGNOSIS — F4310 Post-traumatic stress disorder, unspecified: Secondary | ICD-10-CM | POA: Diagnosis not present

## 2019-05-19 DIAGNOSIS — F410 Panic disorder [episodic paroxysmal anxiety] without agoraphobia: Secondary | ICD-10-CM | POA: Diagnosis not present

## 2019-05-24 DIAGNOSIS — F410 Panic disorder [episodic paroxysmal anxiety] without agoraphobia: Secondary | ICD-10-CM | POA: Diagnosis not present

## 2019-05-24 DIAGNOSIS — F4310 Post-traumatic stress disorder, unspecified: Secondary | ICD-10-CM | POA: Diagnosis not present

## 2019-05-24 DIAGNOSIS — F3181 Bipolar II disorder: Secondary | ICD-10-CM | POA: Diagnosis not present

## 2019-05-24 DIAGNOSIS — F411 Generalized anxiety disorder: Secondary | ICD-10-CM | POA: Diagnosis not present

## 2019-05-26 DIAGNOSIS — J3081 Allergic rhinitis due to animal (cat) (dog) hair and dander: Secondary | ICD-10-CM | POA: Diagnosis not present

## 2019-05-26 DIAGNOSIS — J301 Allergic rhinitis due to pollen: Secondary | ICD-10-CM | POA: Diagnosis not present

## 2019-05-26 DIAGNOSIS — J3089 Other allergic rhinitis: Secondary | ICD-10-CM | POA: Diagnosis not present

## 2019-05-29 DIAGNOSIS — J3081 Allergic rhinitis due to animal (cat) (dog) hair and dander: Secondary | ICD-10-CM | POA: Diagnosis not present

## 2019-05-29 DIAGNOSIS — J3089 Other allergic rhinitis: Secondary | ICD-10-CM | POA: Diagnosis not present

## 2019-05-29 DIAGNOSIS — J301 Allergic rhinitis due to pollen: Secondary | ICD-10-CM | POA: Diagnosis not present

## 2019-05-30 ENCOUNTER — Other Ambulatory Visit: Payer: Self-pay | Admitting: Internal Medicine

## 2019-05-30 DIAGNOSIS — J4530 Mild persistent asthma, uncomplicated: Secondary | ICD-10-CM

## 2019-05-31 DIAGNOSIS — F411 Generalized anxiety disorder: Secondary | ICD-10-CM | POA: Diagnosis not present

## 2019-05-31 DIAGNOSIS — F3181 Bipolar II disorder: Secondary | ICD-10-CM | POA: Diagnosis not present

## 2019-05-31 DIAGNOSIS — F410 Panic disorder [episodic paroxysmal anxiety] without agoraphobia: Secondary | ICD-10-CM | POA: Diagnosis not present

## 2019-05-31 DIAGNOSIS — F4310 Post-traumatic stress disorder, unspecified: Secondary | ICD-10-CM | POA: Diagnosis not present

## 2019-06-06 DIAGNOSIS — F4310 Post-traumatic stress disorder, unspecified: Secondary | ICD-10-CM | POA: Diagnosis not present

## 2019-06-06 DIAGNOSIS — F410 Panic disorder [episodic paroxysmal anxiety] without agoraphobia: Secondary | ICD-10-CM | POA: Diagnosis not present

## 2019-06-06 DIAGNOSIS — F411 Generalized anxiety disorder: Secondary | ICD-10-CM | POA: Diagnosis not present

## 2019-06-06 DIAGNOSIS — F3181 Bipolar II disorder: Secondary | ICD-10-CM | POA: Diagnosis not present

## 2019-06-07 DIAGNOSIS — F410 Panic disorder [episodic paroxysmal anxiety] without agoraphobia: Secondary | ICD-10-CM | POA: Diagnosis not present

## 2019-06-07 DIAGNOSIS — F4310 Post-traumatic stress disorder, unspecified: Secondary | ICD-10-CM | POA: Diagnosis not present

## 2019-06-07 DIAGNOSIS — F411 Generalized anxiety disorder: Secondary | ICD-10-CM | POA: Diagnosis not present

## 2019-06-07 DIAGNOSIS — F3181 Bipolar II disorder: Secondary | ICD-10-CM | POA: Diagnosis not present

## 2019-06-10 IMAGING — MR MR ABDOMEN WO/W CM MRCP
12 of 20 series · 29 of 48 positions shown · IV contrast (18ml Multihance)
Comparison: 05/03/2015 abdominal sonogram.

CLINICAL DATA: Indeterminate liver lesion on prior abdominal
sonogram. Right upper quadrant abdominal tenderness. Cholecystectomy
3 years prior.

EXAM:
MRI ABDOMEN WITHOUT AND WITH CONTRAST (INCLUDING MRCP)
TECHNIQUE: Multiplanar multisequence MR imaging of the abdomen was performed
both before and after the administration of intravenous contrast.
Heavily T2-weighted images of the biliary and pancreatic ducts were
obtained, and three-dimensional MRCP images were rendered by post
processing.
CONTRAST:  18mL MULTIHANCE GADOBENATE DIMEGLUMINE 529 MG/ML IV SOLN

[Series 3: T2 · coronal · 5.5mm · 1.48mm/px · 2 of 30 slices shown (1 of 3)]
[im 1/30]
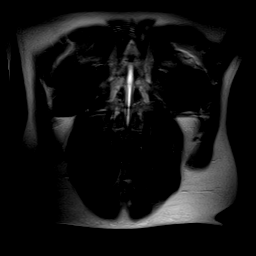
[im 30/30]
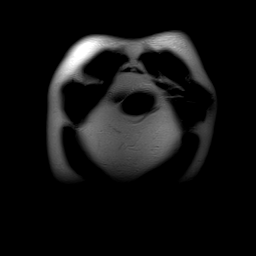

[Series 4: T2 · axial · 6.0mm · 1.48mm/px · z∈[-126,+74]mm · 2 of 30 slices shown (2 of 3)]
[im 1/30]
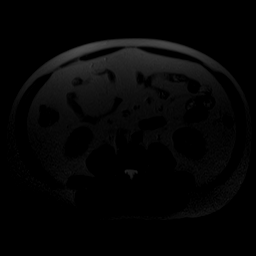
[im 30/30]
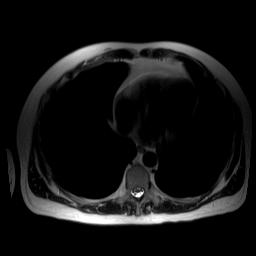

[Series 7: T2 · axial · 6.0mm · 0.74mm/px · z∈[-97,+119]mm · 2 of 31 slices shown (3 of 3)]
[im 1/31]
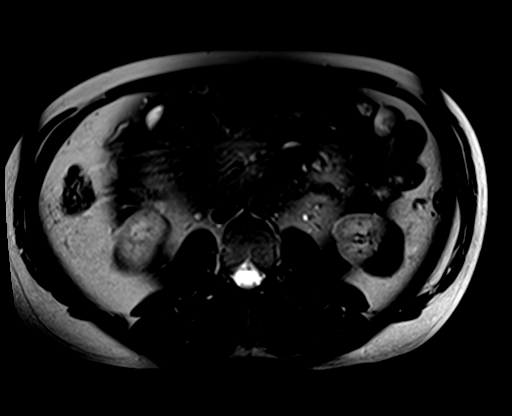
[im 31/31]
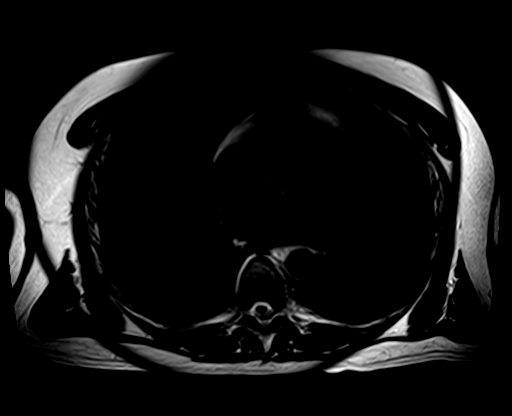

[Series 9: ep2d_diff_b50_500_800_p2 · axial · 6.0mm · 1.98mm/px · z∈[-89,+111]mm · 4 of 90 slices shown]
[im 1/90]
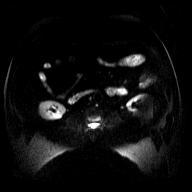
[im 30/90]
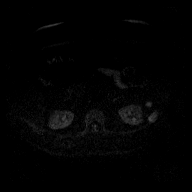
[im 60/90]
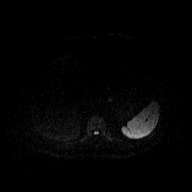
[im 90/90]
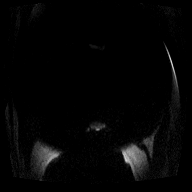

[Series 10: ep2d_[id number]_500_800_p2_adc · axial · 6.0mm · 1.98mm/px · 1 of 30 slices shown]
[im 1/30]
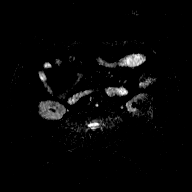

[Series 16: axial in out · axial · 6.0mm · 0.74mm/px · z∈[-139,+77]mm · 2 of 62 slices shown]
[im 1/62]
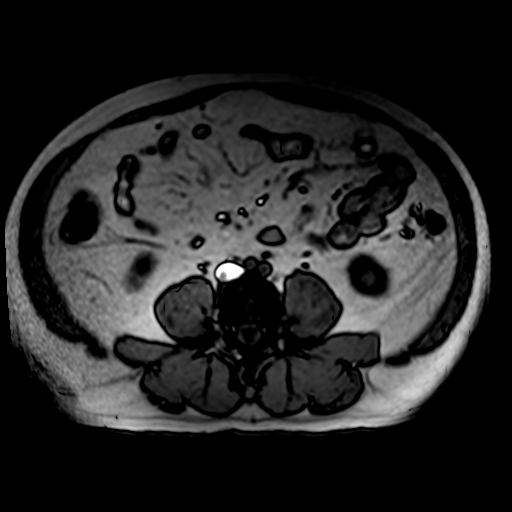
[im 62/62]
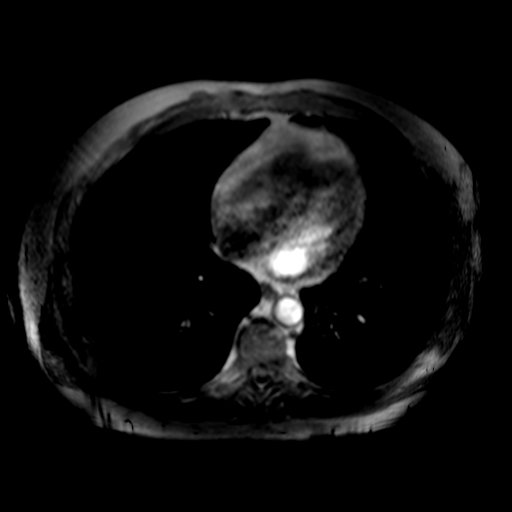

[Series 17: cor haste fs · coronal · 3.0mm · 0.70mm/px · 1 of 19 slices shown]
[im 1/19]
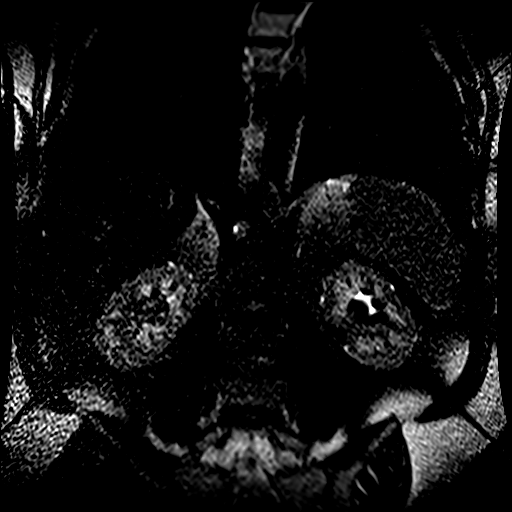

[Series 18: T1 dynamic · axial · non-contrast · 2.7mm · 0.74mm/px · z∈[-150,+85]mm · 3 of 88 slices shown]
[im 1/88]
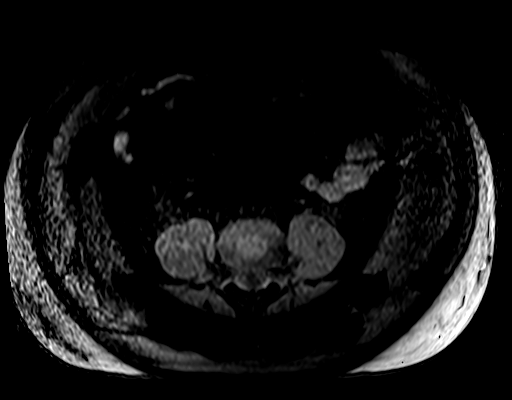
[im 44/88]
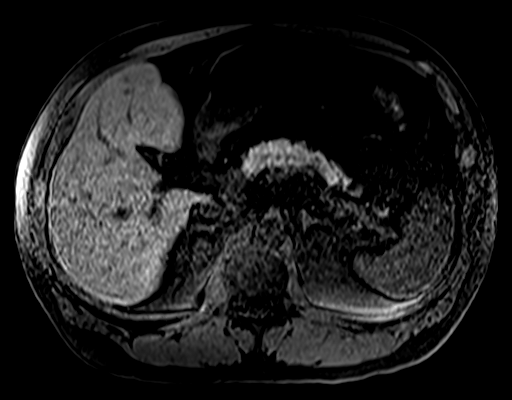
[im 88/88]
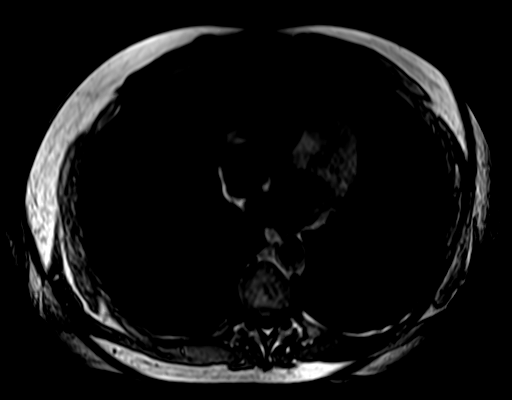

[Series 19: post 25 sec · axial · 2.7mm · 0.74mm/px · z∈[-150,+85]mm · 3 of 88 slices shown]
[im 1/88]
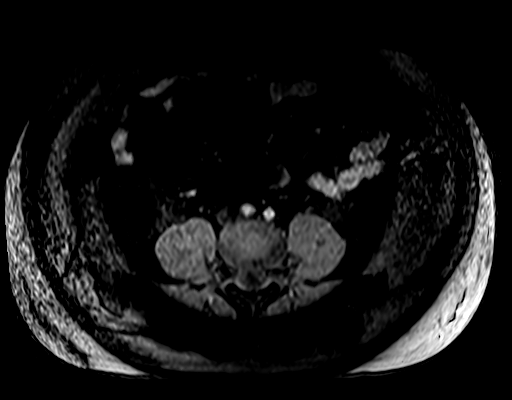
[im 44/88]
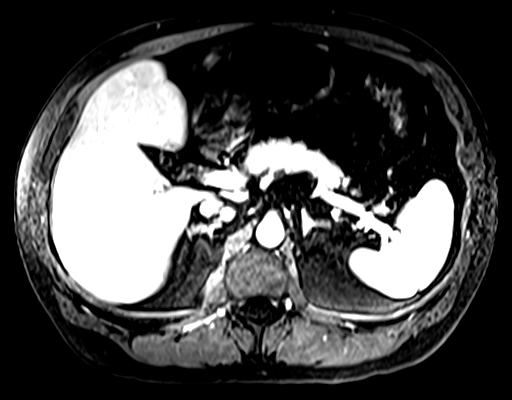
[im 88/88]
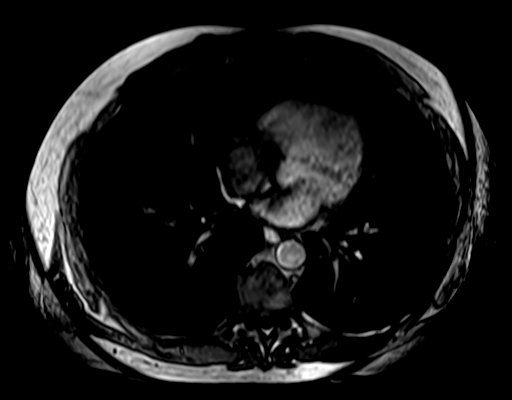

[Series 20: post 25 sec_sub · axial · 2.7mm · 0.74mm/px · z∈[-150,+85]mm · 3 of 88 slices shown]
[im 1/88]
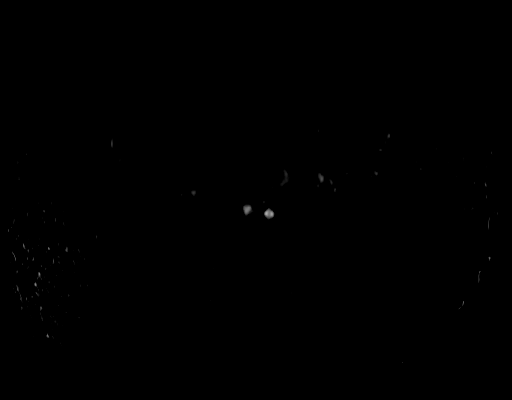
[im 44/88]
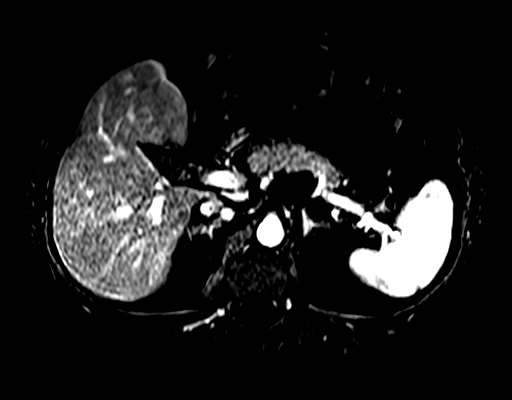
[im 88/88]
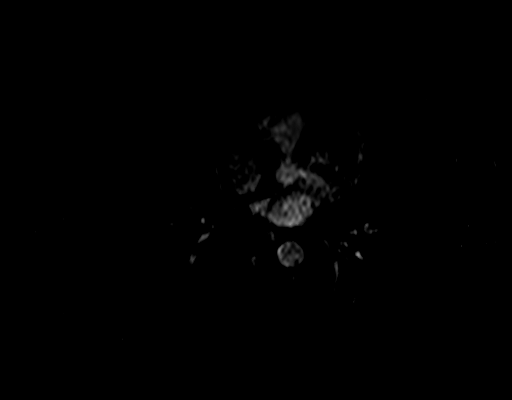

[Series 21: post 45 sec · axial · 2.7mm · 0.74mm/px · z∈[-150,+85]mm · 3 of 88 slices shown]
[im 1/88]
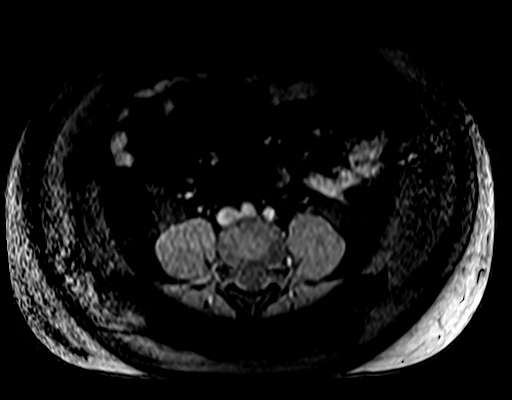
[im 44/88]
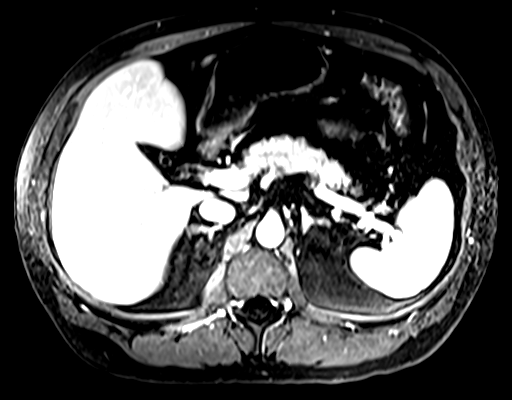
[im 88/88]
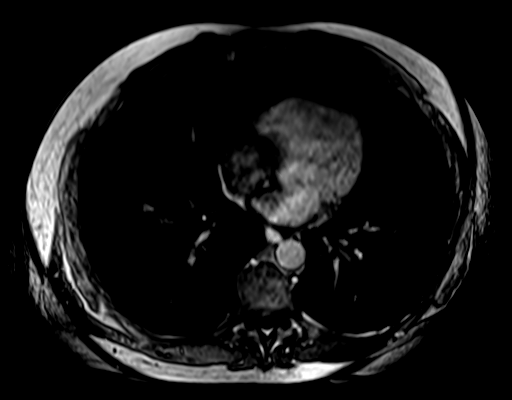

[Series 22: post 45 sec_sub · axial · 2.7mm · 0.74mm/px · z∈[-150,+85]mm · 3 of 88 slices shown]
[im 1/88]
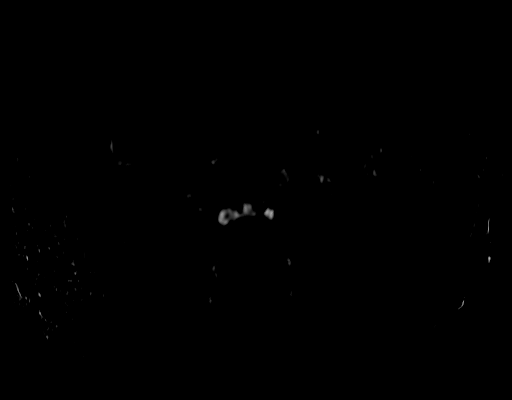
[im 44/88]
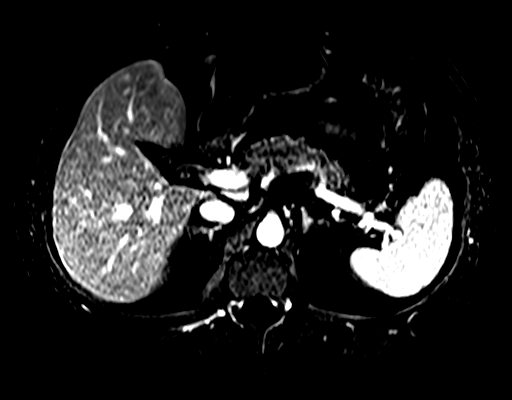
[im 88/88]
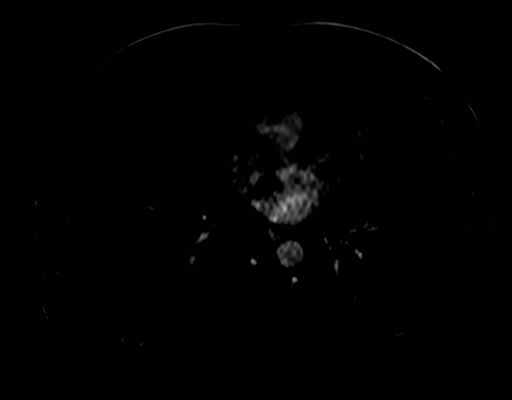

[29 of 48 positions shown; findings below may reference images not displayed]

FINDINGS: Lower chest: No acute abnormality at the lung bases.

Hepatobiliary: Normal liver size and configuration. Minimal diffuse
hepatic steatosis. No liver masses. There is a vague small focus of
fatty sparing in the subcapsular liver adjacent to the gallbladder
fossa (series 16/image 13), which probably accounts for the
previously noted sonographic finding. Cholecystectomy. No biliary
ductal dilatation. Common bile duct diameter 3 mm. No
choledocholithiasis.

Pancreas: No pancreatic mass or duct dilation.  No pancreas divisum.

Spleen: Normal size. No mass.

Adrenals/Urinary Tract: Normal adrenals. No hydronephrosis. Normal
kidneys with no renal mass.

Stomach/Bowel: Normal non-distended stomach. Visualized small and
large bowel is normal caliber, with no bowel wall thickening.

Vascular/Lymphatic: Normal caliber abdominal aorta. Patent portal,
splenic, hepatic and renal veins. No pathologically enlarged lymph
nodes in the abdomen.

Other: No abdominal ascites or focal fluid collection.

Musculoskeletal: No aggressive appearing focal osseous lesions.
IMPRESSION: 1. No liver masses.
2. Minimal diffuse hepatic steatosis. Small vague focus of fatty
sparing in the subcapsular liver adjacent to the gallbladder fossa,
which probably accounts for the previously noted sonographic
finding.
3. Normal bile ducts status post cholecystectomy. No acute
abnormality.

## 2019-06-14 DIAGNOSIS — F410 Panic disorder [episodic paroxysmal anxiety] without agoraphobia: Secondary | ICD-10-CM | POA: Diagnosis not present

## 2019-06-14 DIAGNOSIS — F411 Generalized anxiety disorder: Secondary | ICD-10-CM | POA: Diagnosis not present

## 2019-06-14 DIAGNOSIS — F3181 Bipolar II disorder: Secondary | ICD-10-CM | POA: Diagnosis not present

## 2019-06-14 DIAGNOSIS — F4310 Post-traumatic stress disorder, unspecified: Secondary | ICD-10-CM | POA: Diagnosis not present

## 2019-06-19 DIAGNOSIS — J3081 Allergic rhinitis due to animal (cat) (dog) hair and dander: Secondary | ICD-10-CM | POA: Diagnosis not present

## 2019-06-19 DIAGNOSIS — J3089 Other allergic rhinitis: Secondary | ICD-10-CM | POA: Diagnosis not present

## 2019-06-19 DIAGNOSIS — J301 Allergic rhinitis due to pollen: Secondary | ICD-10-CM | POA: Diagnosis not present

## 2019-06-21 DIAGNOSIS — F4310 Post-traumatic stress disorder, unspecified: Secondary | ICD-10-CM | POA: Diagnosis not present

## 2019-06-21 DIAGNOSIS — F3181 Bipolar II disorder: Secondary | ICD-10-CM | POA: Diagnosis not present

## 2019-06-21 DIAGNOSIS — F410 Panic disorder [episodic paroxysmal anxiety] without agoraphobia: Secondary | ICD-10-CM | POA: Diagnosis not present

## 2019-06-21 DIAGNOSIS — F411 Generalized anxiety disorder: Secondary | ICD-10-CM | POA: Diagnosis not present

## 2019-06-26 DIAGNOSIS — J3081 Allergic rhinitis due to animal (cat) (dog) hair and dander: Secondary | ICD-10-CM | POA: Diagnosis not present

## 2019-06-26 DIAGNOSIS — J3089 Other allergic rhinitis: Secondary | ICD-10-CM | POA: Diagnosis not present

## 2019-06-26 DIAGNOSIS — J301 Allergic rhinitis due to pollen: Secondary | ICD-10-CM | POA: Diagnosis not present

## 2019-06-28 DIAGNOSIS — F410 Panic disorder [episodic paroxysmal anxiety] without agoraphobia: Secondary | ICD-10-CM | POA: Diagnosis not present

## 2019-06-28 DIAGNOSIS — F4310 Post-traumatic stress disorder, unspecified: Secondary | ICD-10-CM | POA: Diagnosis not present

## 2019-06-28 DIAGNOSIS — F3181 Bipolar II disorder: Secondary | ICD-10-CM | POA: Diagnosis not present

## 2019-06-28 DIAGNOSIS — F411 Generalized anxiety disorder: Secondary | ICD-10-CM | POA: Diagnosis not present

## 2019-07-05 DIAGNOSIS — F3181 Bipolar II disorder: Secondary | ICD-10-CM | POA: Diagnosis not present

## 2019-07-05 DIAGNOSIS — F410 Panic disorder [episodic paroxysmal anxiety] without agoraphobia: Secondary | ICD-10-CM | POA: Diagnosis not present

## 2019-07-05 DIAGNOSIS — F411 Generalized anxiety disorder: Secondary | ICD-10-CM | POA: Diagnosis not present

## 2019-07-05 DIAGNOSIS — F4310 Post-traumatic stress disorder, unspecified: Secondary | ICD-10-CM | POA: Diagnosis not present

## 2019-07-06 DIAGNOSIS — J3081 Allergic rhinitis due to animal (cat) (dog) hair and dander: Secondary | ICD-10-CM | POA: Diagnosis not present

## 2019-07-06 DIAGNOSIS — J301 Allergic rhinitis due to pollen: Secondary | ICD-10-CM | POA: Diagnosis not present

## 2019-07-06 DIAGNOSIS — J3089 Other allergic rhinitis: Secondary | ICD-10-CM | POA: Diagnosis not present

## 2019-07-11 DIAGNOSIS — J3089 Other allergic rhinitis: Secondary | ICD-10-CM | POA: Diagnosis not present

## 2019-07-11 DIAGNOSIS — J301 Allergic rhinitis due to pollen: Secondary | ICD-10-CM | POA: Diagnosis not present

## 2019-07-11 DIAGNOSIS — J3081 Allergic rhinitis due to animal (cat) (dog) hair and dander: Secondary | ICD-10-CM | POA: Diagnosis not present

## 2019-07-12 DIAGNOSIS — F3181 Bipolar II disorder: Secondary | ICD-10-CM | POA: Diagnosis not present

## 2019-07-12 DIAGNOSIS — F411 Generalized anxiety disorder: Secondary | ICD-10-CM | POA: Diagnosis not present

## 2019-07-12 DIAGNOSIS — F4310 Post-traumatic stress disorder, unspecified: Secondary | ICD-10-CM | POA: Diagnosis not present

## 2019-07-12 DIAGNOSIS — F410 Panic disorder [episodic paroxysmal anxiety] without agoraphobia: Secondary | ICD-10-CM | POA: Diagnosis not present

## 2019-07-17 DIAGNOSIS — J3081 Allergic rhinitis due to animal (cat) (dog) hair and dander: Secondary | ICD-10-CM | POA: Diagnosis not present

## 2019-07-17 DIAGNOSIS — F3181 Bipolar II disorder: Secondary | ICD-10-CM | POA: Diagnosis not present

## 2019-07-17 DIAGNOSIS — F410 Panic disorder [episodic paroxysmal anxiety] without agoraphobia: Secondary | ICD-10-CM | POA: Diagnosis not present

## 2019-07-17 DIAGNOSIS — J3089 Other allergic rhinitis: Secondary | ICD-10-CM | POA: Diagnosis not present

## 2019-07-17 DIAGNOSIS — F4310 Post-traumatic stress disorder, unspecified: Secondary | ICD-10-CM | POA: Diagnosis not present

## 2019-07-17 DIAGNOSIS — F411 Generalized anxiety disorder: Secondary | ICD-10-CM | POA: Diagnosis not present

## 2019-07-17 DIAGNOSIS — J301 Allergic rhinitis due to pollen: Secondary | ICD-10-CM | POA: Diagnosis not present

## 2019-07-19 DIAGNOSIS — F3181 Bipolar II disorder: Secondary | ICD-10-CM | POA: Diagnosis not present

## 2019-07-19 DIAGNOSIS — F410 Panic disorder [episodic paroxysmal anxiety] without agoraphobia: Secondary | ICD-10-CM | POA: Diagnosis not present

## 2019-07-19 DIAGNOSIS — F411 Generalized anxiety disorder: Secondary | ICD-10-CM | POA: Diagnosis not present

## 2019-07-19 DIAGNOSIS — F4310 Post-traumatic stress disorder, unspecified: Secondary | ICD-10-CM | POA: Diagnosis not present

## 2019-07-24 DIAGNOSIS — K219 Gastro-esophageal reflux disease without esophagitis: Secondary | ICD-10-CM | POA: Diagnosis not present

## 2019-07-24 DIAGNOSIS — R1310 Dysphagia, unspecified: Secondary | ICD-10-CM | POA: Diagnosis not present

## 2019-07-24 DIAGNOSIS — Z1211 Encounter for screening for malignant neoplasm of colon: Secondary | ICD-10-CM | POA: Diagnosis not present

## 2019-07-25 DIAGNOSIS — J301 Allergic rhinitis due to pollen: Secondary | ICD-10-CM | POA: Diagnosis not present

## 2019-07-25 DIAGNOSIS — J3089 Other allergic rhinitis: Secondary | ICD-10-CM | POA: Diagnosis not present

## 2019-07-25 DIAGNOSIS — J3081 Allergic rhinitis due to animal (cat) (dog) hair and dander: Secondary | ICD-10-CM | POA: Diagnosis not present

## 2019-07-26 DIAGNOSIS — F4310 Post-traumatic stress disorder, unspecified: Secondary | ICD-10-CM | POA: Diagnosis not present

## 2019-07-26 DIAGNOSIS — F411 Generalized anxiety disorder: Secondary | ICD-10-CM | POA: Diagnosis not present

## 2019-07-26 DIAGNOSIS — F3181 Bipolar II disorder: Secondary | ICD-10-CM | POA: Diagnosis not present

## 2019-07-26 DIAGNOSIS — F410 Panic disorder [episodic paroxysmal anxiety] without agoraphobia: Secondary | ICD-10-CM | POA: Diagnosis not present

## 2019-07-31 DIAGNOSIS — J301 Allergic rhinitis due to pollen: Secondary | ICD-10-CM | POA: Diagnosis not present

## 2019-07-31 DIAGNOSIS — J3089 Other allergic rhinitis: Secondary | ICD-10-CM | POA: Diagnosis not present

## 2019-07-31 DIAGNOSIS — J3081 Allergic rhinitis due to animal (cat) (dog) hair and dander: Secondary | ICD-10-CM | POA: Diagnosis not present

## 2019-08-02 DIAGNOSIS — F410 Panic disorder [episodic paroxysmal anxiety] without agoraphobia: Secondary | ICD-10-CM | POA: Diagnosis not present

## 2019-08-02 DIAGNOSIS — F3181 Bipolar II disorder: Secondary | ICD-10-CM | POA: Diagnosis not present

## 2019-08-02 DIAGNOSIS — F411 Generalized anxiety disorder: Secondary | ICD-10-CM | POA: Diagnosis not present

## 2019-08-02 DIAGNOSIS — F4310 Post-traumatic stress disorder, unspecified: Secondary | ICD-10-CM | POA: Diagnosis not present

## 2019-08-11 DIAGNOSIS — F411 Generalized anxiety disorder: Secondary | ICD-10-CM | POA: Diagnosis not present

## 2019-08-11 DIAGNOSIS — F3181 Bipolar II disorder: Secondary | ICD-10-CM | POA: Diagnosis not present

## 2019-08-11 DIAGNOSIS — F410 Panic disorder [episodic paroxysmal anxiety] without agoraphobia: Secondary | ICD-10-CM | POA: Diagnosis not present

## 2019-08-11 DIAGNOSIS — F4310 Post-traumatic stress disorder, unspecified: Secondary | ICD-10-CM | POA: Diagnosis not present

## 2019-08-16 DIAGNOSIS — F3181 Bipolar II disorder: Secondary | ICD-10-CM | POA: Diagnosis not present

## 2019-08-16 DIAGNOSIS — J301 Allergic rhinitis due to pollen: Secondary | ICD-10-CM | POA: Diagnosis not present

## 2019-08-16 DIAGNOSIS — F4310 Post-traumatic stress disorder, unspecified: Secondary | ICD-10-CM | POA: Diagnosis not present

## 2019-08-16 DIAGNOSIS — J3089 Other allergic rhinitis: Secondary | ICD-10-CM | POA: Diagnosis not present

## 2019-08-16 DIAGNOSIS — J3081 Allergic rhinitis due to animal (cat) (dog) hair and dander: Secondary | ICD-10-CM | POA: Diagnosis not present

## 2019-08-16 DIAGNOSIS — F411 Generalized anxiety disorder: Secondary | ICD-10-CM | POA: Diagnosis not present

## 2019-08-16 DIAGNOSIS — F410 Panic disorder [episodic paroxysmal anxiety] without agoraphobia: Secondary | ICD-10-CM | POA: Diagnosis not present

## 2019-08-22 DIAGNOSIS — J3089 Other allergic rhinitis: Secondary | ICD-10-CM | POA: Diagnosis not present

## 2019-08-22 DIAGNOSIS — J3081 Allergic rhinitis due to animal (cat) (dog) hair and dander: Secondary | ICD-10-CM | POA: Diagnosis not present

## 2019-08-22 DIAGNOSIS — J301 Allergic rhinitis due to pollen: Secondary | ICD-10-CM | POA: Diagnosis not present

## 2019-08-24 DIAGNOSIS — F3181 Bipolar II disorder: Secondary | ICD-10-CM | POA: Diagnosis not present

## 2019-08-24 DIAGNOSIS — F4310 Post-traumatic stress disorder, unspecified: Secondary | ICD-10-CM | POA: Diagnosis not present

## 2019-08-24 DIAGNOSIS — F411 Generalized anxiety disorder: Secondary | ICD-10-CM | POA: Diagnosis not present

## 2019-08-24 DIAGNOSIS — F410 Panic disorder [episodic paroxysmal anxiety] without agoraphobia: Secondary | ICD-10-CM | POA: Diagnosis not present

## 2019-08-31 DIAGNOSIS — F411 Generalized anxiety disorder: Secondary | ICD-10-CM | POA: Diagnosis not present

## 2019-08-31 DIAGNOSIS — F410 Panic disorder [episodic paroxysmal anxiety] without agoraphobia: Secondary | ICD-10-CM | POA: Diagnosis not present

## 2019-08-31 DIAGNOSIS — F3181 Bipolar II disorder: Secondary | ICD-10-CM | POA: Diagnosis not present

## 2019-08-31 DIAGNOSIS — F4310 Post-traumatic stress disorder, unspecified: Secondary | ICD-10-CM | POA: Diagnosis not present

## 2019-09-08 DIAGNOSIS — F332 Major depressive disorder, recurrent severe without psychotic features: Secondary | ICD-10-CM | POA: Diagnosis not present

## 2019-09-12 DIAGNOSIS — D123 Benign neoplasm of transverse colon: Secondary | ICD-10-CM | POA: Diagnosis not present

## 2019-09-12 DIAGNOSIS — K635 Polyp of colon: Secondary | ICD-10-CM | POA: Diagnosis not present

## 2019-09-12 DIAGNOSIS — Z1211 Encounter for screening for malignant neoplasm of colon: Secondary | ICD-10-CM | POA: Diagnosis not present

## 2019-09-12 DIAGNOSIS — K2100 Gastro-esophageal reflux disease with esophagitis, without bleeding: Secondary | ICD-10-CM | POA: Diagnosis not present

## 2019-09-12 DIAGNOSIS — K2101 Gastro-esophageal reflux disease with esophagitis, with bleeding: Secondary | ICD-10-CM | POA: Diagnosis not present

## 2019-09-12 DIAGNOSIS — K5730 Diverticulosis of large intestine without perforation or abscess without bleeding: Secondary | ICD-10-CM | POA: Diagnosis not present

## 2019-09-12 DIAGNOSIS — K222 Esophageal obstruction: Secondary | ICD-10-CM | POA: Diagnosis not present

## 2019-09-12 LAB — HM COLONOSCOPY

## 2019-09-14 DIAGNOSIS — F332 Major depressive disorder, recurrent severe without psychotic features: Secondary | ICD-10-CM | POA: Diagnosis not present

## 2019-09-18 DIAGNOSIS — J3089 Other allergic rhinitis: Secondary | ICD-10-CM | POA: Diagnosis not present

## 2019-09-18 DIAGNOSIS — J301 Allergic rhinitis due to pollen: Secondary | ICD-10-CM | POA: Diagnosis not present

## 2019-09-18 DIAGNOSIS — J3081 Allergic rhinitis due to animal (cat) (dog) hair and dander: Secondary | ICD-10-CM | POA: Diagnosis not present

## 2019-09-21 DIAGNOSIS — F332 Major depressive disorder, recurrent severe without psychotic features: Secondary | ICD-10-CM | POA: Diagnosis not present

## 2019-09-22 ENCOUNTER — Encounter: Payer: Self-pay | Admitting: Internal Medicine

## 2019-09-22 DIAGNOSIS — J301 Allergic rhinitis due to pollen: Secondary | ICD-10-CM | POA: Diagnosis not present

## 2019-09-22 DIAGNOSIS — J3081 Allergic rhinitis due to animal (cat) (dog) hair and dander: Secondary | ICD-10-CM | POA: Diagnosis not present

## 2019-09-22 DIAGNOSIS — J3089 Other allergic rhinitis: Secondary | ICD-10-CM | POA: Diagnosis not present

## 2019-09-28 DIAGNOSIS — F332 Major depressive disorder, recurrent severe without psychotic features: Secondary | ICD-10-CM | POA: Diagnosis not present

## 2019-10-02 DIAGNOSIS — F4310 Post-traumatic stress disorder, unspecified: Secondary | ICD-10-CM | POA: Diagnosis not present

## 2019-10-02 DIAGNOSIS — F3181 Bipolar II disorder: Secondary | ICD-10-CM | POA: Diagnosis not present

## 2019-10-03 DIAGNOSIS — J301 Allergic rhinitis due to pollen: Secondary | ICD-10-CM | POA: Diagnosis not present

## 2019-10-03 DIAGNOSIS — J3081 Allergic rhinitis due to animal (cat) (dog) hair and dander: Secondary | ICD-10-CM | POA: Diagnosis not present

## 2019-10-03 DIAGNOSIS — J3089 Other allergic rhinitis: Secondary | ICD-10-CM | POA: Diagnosis not present

## 2019-10-05 DIAGNOSIS — F332 Major depressive disorder, recurrent severe without psychotic features: Secondary | ICD-10-CM | POA: Diagnosis not present

## 2019-10-10 DIAGNOSIS — J3081 Allergic rhinitis due to animal (cat) (dog) hair and dander: Secondary | ICD-10-CM | POA: Diagnosis not present

## 2019-10-10 DIAGNOSIS — J3089 Other allergic rhinitis: Secondary | ICD-10-CM | POA: Diagnosis not present

## 2019-10-10 DIAGNOSIS — J301 Allergic rhinitis due to pollen: Secondary | ICD-10-CM | POA: Diagnosis not present

## 2019-10-12 DIAGNOSIS — F332 Major depressive disorder, recurrent severe without psychotic features: Secondary | ICD-10-CM | POA: Diagnosis not present

## 2019-10-13 DIAGNOSIS — J301 Allergic rhinitis due to pollen: Secondary | ICD-10-CM | POA: Diagnosis not present

## 2019-10-13 DIAGNOSIS — J3081 Allergic rhinitis due to animal (cat) (dog) hair and dander: Secondary | ICD-10-CM | POA: Diagnosis not present

## 2019-10-16 DIAGNOSIS — J3089 Other allergic rhinitis: Secondary | ICD-10-CM | POA: Diagnosis not present

## 2019-10-17 DIAGNOSIS — H1045 Other chronic allergic conjunctivitis: Secondary | ICD-10-CM | POA: Diagnosis not present

## 2019-10-17 DIAGNOSIS — J3089 Other allergic rhinitis: Secondary | ICD-10-CM | POA: Diagnosis not present

## 2019-10-17 DIAGNOSIS — J301 Allergic rhinitis due to pollen: Secondary | ICD-10-CM | POA: Diagnosis not present

## 2019-10-17 DIAGNOSIS — J3081 Allergic rhinitis due to animal (cat) (dog) hair and dander: Secondary | ICD-10-CM | POA: Diagnosis not present

## 2019-10-19 DIAGNOSIS — F332 Major depressive disorder, recurrent severe without psychotic features: Secondary | ICD-10-CM | POA: Diagnosis not present

## 2019-10-23 DIAGNOSIS — J301 Allergic rhinitis due to pollen: Secondary | ICD-10-CM | POA: Diagnosis not present

## 2019-10-23 DIAGNOSIS — J3089 Other allergic rhinitis: Secondary | ICD-10-CM | POA: Diagnosis not present

## 2019-10-23 DIAGNOSIS — J3081 Allergic rhinitis due to animal (cat) (dog) hair and dander: Secondary | ICD-10-CM | POA: Diagnosis not present

## 2019-10-26 DIAGNOSIS — F3181 Bipolar II disorder: Secondary | ICD-10-CM | POA: Diagnosis not present

## 2019-10-26 DIAGNOSIS — F4310 Post-traumatic stress disorder, unspecified: Secondary | ICD-10-CM | POA: Diagnosis not present

## 2019-11-01 DIAGNOSIS — J3081 Allergic rhinitis due to animal (cat) (dog) hair and dander: Secondary | ICD-10-CM | POA: Diagnosis not present

## 2019-11-01 DIAGNOSIS — J3089 Other allergic rhinitis: Secondary | ICD-10-CM | POA: Diagnosis not present

## 2019-11-01 DIAGNOSIS — J301 Allergic rhinitis due to pollen: Secondary | ICD-10-CM | POA: Diagnosis not present

## 2019-11-02 DIAGNOSIS — F332 Major depressive disorder, recurrent severe without psychotic features: Secondary | ICD-10-CM | POA: Diagnosis not present

## 2019-11-08 DIAGNOSIS — T886XXA Anaphylactic reaction due to adverse effect of correct drug or medicament properly administered, initial encounter: Secondary | ICD-10-CM | POA: Diagnosis not present

## 2019-11-08 DIAGNOSIS — J3081 Allergic rhinitis due to animal (cat) (dog) hair and dander: Secondary | ICD-10-CM | POA: Diagnosis not present

## 2019-11-08 DIAGNOSIS — J3089 Other allergic rhinitis: Secondary | ICD-10-CM | POA: Diagnosis not present

## 2019-11-08 DIAGNOSIS — J301 Allergic rhinitis due to pollen: Secondary | ICD-10-CM | POA: Diagnosis not present

## 2019-11-09 DIAGNOSIS — J3089 Other allergic rhinitis: Secondary | ICD-10-CM | POA: Diagnosis not present

## 2019-11-09 DIAGNOSIS — F332 Major depressive disorder, recurrent severe without psychotic features: Secondary | ICD-10-CM | POA: Diagnosis not present

## 2019-11-09 DIAGNOSIS — J301 Allergic rhinitis due to pollen: Secondary | ICD-10-CM | POA: Diagnosis not present

## 2019-11-09 DIAGNOSIS — J3081 Allergic rhinitis due to animal (cat) (dog) hair and dander: Secondary | ICD-10-CM | POA: Diagnosis not present

## 2019-11-22 DIAGNOSIS — J3081 Allergic rhinitis due to animal (cat) (dog) hair and dander: Secondary | ICD-10-CM | POA: Diagnosis not present

## 2019-11-22 DIAGNOSIS — J301 Allergic rhinitis due to pollen: Secondary | ICD-10-CM | POA: Diagnosis not present

## 2019-11-22 DIAGNOSIS — J3089 Other allergic rhinitis: Secondary | ICD-10-CM | POA: Diagnosis not present

## 2019-11-27 ENCOUNTER — Other Ambulatory Visit: Payer: Self-pay

## 2019-11-27 ENCOUNTER — Encounter: Payer: Self-pay | Admitting: Internal Medicine

## 2019-11-27 ENCOUNTER — Ambulatory Visit (INDEPENDENT_AMBULATORY_CARE_PROVIDER_SITE_OTHER): Payer: 59 | Admitting: Internal Medicine

## 2019-11-27 VITALS — BP 134/82 | HR 68 | Temp 98.4°F | Resp 16 | Ht 71.0 in | Wt 192.4 lb

## 2019-11-27 DIAGNOSIS — I10 Essential (primary) hypertension: Secondary | ICD-10-CM

## 2019-11-27 DIAGNOSIS — R200 Anesthesia of skin: Secondary | ICD-10-CM

## 2019-11-27 DIAGNOSIS — R202 Paresthesia of skin: Secondary | ICD-10-CM | POA: Diagnosis not present

## 2019-11-27 DIAGNOSIS — J3081 Allergic rhinitis due to animal (cat) (dog) hair and dander: Secondary | ICD-10-CM | POA: Diagnosis not present

## 2019-11-27 DIAGNOSIS — F331 Major depressive disorder, recurrent, moderate: Secondary | ICD-10-CM | POA: Insufficient documentation

## 2019-11-27 DIAGNOSIS — Z0000 Encounter for general adult medical examination without abnormal findings: Secondary | ICD-10-CM | POA: Diagnosis not present

## 2019-11-27 DIAGNOSIS — J3089 Other allergic rhinitis: Secondary | ICD-10-CM | POA: Diagnosis not present

## 2019-11-27 DIAGNOSIS — J301 Allergic rhinitis due to pollen: Secondary | ICD-10-CM | POA: Diagnosis not present

## 2019-11-27 DIAGNOSIS — G63 Polyneuropathy in diseases classified elsewhere: Secondary | ICD-10-CM | POA: Diagnosis not present

## 2019-11-27 DIAGNOSIS — E538 Deficiency of other specified B group vitamins: Secondary | ICD-10-CM

## 2019-11-27 NOTE — Patient Instructions (Signed)

## 2019-11-27 NOTE — Progress Notes (Addendum)
Subjective:  Patient ID: Derek Tucker, male    DOB: June 12, 1971  Age: 49 y.o. MRN: [id number]  CC: Annual Exam and Hypertension  This visit occurred during the SARS-CoV-2 public health emergency.  Safety protocols were in place, including screening questions prior to the visit, additional usage of staff PPE, and extensive cleaning of exam room while observing appropriate contact time as indicated for disinfecting solutions.    HPI Beau-Jacques C Aliano presents for a CPX.  Over the last few years he has been treated by someone in [place] for depression with ketamine.  He says he wants to be referred to someone in [place] to treat the depression and to consider doing [place].  He continues to struggle with symptoms of mood disorder with anhedonia and anxiety.  He has a history of hypertension but is controlling his blood pressure with lifestyle modifications.  He denies any recent episodes of headache, blurred vision, chest pain, shortness of breath, palpitations, or edema.  He has had chronic neck pain and now for the last few months has developed numbness and tingling in his extremities.  He says that one of his colleagues did a NCS/EMG on him and he says it was unremarkable.  The neck pain does not radiate towards his extremities.  He has chronic, unchanged, nonradiating low back pain.   Outpatient Medications Prior to Visit  Medication Sig Dispense Refill  . albuterol (VENTOLIN HFA) 108 (90 Base) MCG/ACT inhaler INHALE 2 PUFFS INTO THE LUNGS EVERY 6 HOURS AS NEEDED FOR WHEEZING. 18 g 0  . fish oil-omega-3 fatty acids 1000 MG capsule Take 2 g by mouth daily.    . fluticasone (FLONASE) 50 MCG/ACT nasal spray Place 4 sprays into the nose daily. 16 g 11  . fluticasone (FLOVENT HFA) 110 MCG/ACT inhaler Inhale 1 puff into the lungs 2 (two) times daily. (Patient taking differently: Inhale 1 puff into the lungs 2 (two) times daily as needed. ) 1 Inhaler 12  . Spacer/Aero Chamber  Mouthpiece MISC 1 Act by Does not apply route 2 (two) times daily. 1 each 11  . SUMAtriptan (IMITREX) 100 MG tablet Take 1 tablet (100 mg total) by mouth every 2 (two) hours as needed for migraine. May repeat in 2 hours if headache persists or recurs. 10 tablet 5  . ALPRAZolam (XANAX) 0.25 MG tablet Take 1 tablet by mouth twice daily as needed for anxiety (Patient not taking: Reported on 11/27/2019) 10 tablet 0   No facility-administered medications prior to visit.    ROS Review of Systems  Constitutional: Negative.  Negative for appetite change, chills, diaphoresis, fatigue and fever.  HENT: Negative.   Eyes: Negative for visual disturbance.  Respiratory: Negative for chest tightness, shortness of breath and wheezing.   Cardiovascular: Negative for chest pain, palpitations and leg swelling.  Gastrointestinal: Negative for abdominal pain, blood in stool, constipation, diarrhea, nausea and vomiting.  Endocrine: Negative.   Genitourinary: Negative.  Negative for difficulty urinating.  Musculoskeletal: Positive for neck pain. Negative for arthralgias, back pain and myalgias.  Skin: Negative.  Negative for color change, pallor and rash.  Neurological: Positive for numbness. Negative for dizziness, tremors, seizures, syncope, weakness, light-headedness and headaches.  Hematological: Negative for adenopathy. Does not bruise/bleed easily.  Psychiatric/Behavioral: Positive for dysphoric mood. Negative for sleep disturbance and suicidal ideas. The patient is nervous/anxious.     Objective:  BP 134/82 (BP Location: Left Arm, Patient Position: Sitting, Cuff Size: Normal)   Pulse 68   Temp 98.4 F (  36.9 C) (Oral)   Resp 16   Ht 5\' 11"  (1.803 m)   Wt 192 lb 6 oz (87.3 kg)   SpO2 95%   BMI 26.83 kg/m   BP Readings from Last 3 Encounters:  11/27/19 134/82  03/08/18 (!) 138/92  01/14/18 124/78    Wt Readings from Last 3 Encounters:  11/27/19 192 lb 6 oz (87.3 kg)  03/08/18 194 lb 8 oz  (88.2 kg)  01/14/18 191 lb (86.6 kg)    Physical Exam Vitals reviewed.  Constitutional:      Appearance: Normal appearance.  HENT:     Nose: Nose normal.     Mouth/Throat:     Mouth: Mucous membranes are moist.  Eyes:     General: No scleral icterus.    Conjunctiva/sclera: Conjunctivae normal.  Cardiovascular:     Rate and Rhythm: Normal rate and regular rhythm.     Heart sounds: No murmur heard.   Pulmonary:     Effort: Pulmonary effort is normal.     Breath sounds: No stridor. No wheezing, rhonchi or rales.  Abdominal:     General: Abdomen is flat. There is no distension.     Palpations: There is no mass.     Tenderness: There is no guarding.  Genitourinary:    Comments: GU/rectal exams deferred at his request.  He tells me he saw a urologist 8 months ago and had a colonoscopy about 3 months ago. Musculoskeletal:        General: Normal range of motion.     Cervical back: Neck supple.     Right lower leg: No edema.     Left lower leg: No edema.  Lymphadenopathy:     Cervical: No cervical adenopathy.  Skin:    General: Skin is warm and dry.     Coloration: Skin is not pale.     Findings: No rash.  Neurological:     General: No focal deficit present.     Mental Status: He is alert and oriented to person, place, and time. Mental status is at baseline.  Psychiatric:        Attention and Perception: He is inattentive.        Mood and Affect: Mood is anxious. Mood is not depressed or elated. Affect is not labile, blunt, flat, angry or tearful.        Speech: Speech is rapid and pressured and tangential. Speech is not delayed or slurred.        Behavior: Behavior normal. Behavior is cooperative.        Thought Content: Thought content normal. Thought content is not paranoid or delusional. Thought content does not include homicidal or suicidal ideation. Thought content does not include homicidal plan.        Cognition and Memory: Cognition normal.        Judgment: Judgment  normal.     Lab Results  Component Value Date   WBC 8.1 11/29/2019   HGB 14.5 11/29/2019   HCT 41.6 11/29/2019   PLT 311.0 11/29/2019   GLUCOSE 104 (H) 11/29/2019   CHOL 136 11/29/2019   TRIG 155.0 (H) 11/29/2019   HDL 40.50 11/29/2019   LDLDIRECT 85.0 01/19/2017   LDLCALC 64 11/29/2019   ALT 37 11/29/2019   AST 24 11/29/2019   NA 139 11/29/2019   K 3.8 11/29/2019   CL 101 11/29/2019   CREATININE 0.94 11/29/2019   BUN 9 11/29/2019   CO2 31 11/29/2019   TSH 2.89 11/29/2019  PSA 0.28 11/29/2019    MR ABDOMEN MRCP W WO CONTAST  Result Date: 03/22/2018 CLINICAL DATA:  Indeterminate liver lesion on prior abdominal sonogram. Right upper quadrant abdominal tenderness. Cholecystectomy 3 years prior. EXAM: MRI ABDOMEN WITHOUT AND WITH CONTRAST (INCLUDING MRCP) TECHNIQUE: Multiplanar multisequence MR imaging of the abdomen was performed both before and after the administration of intravenous contrast. Heavily T2-weighted images of the biliary and pancreatic ducts were obtained, and three-dimensional MRCP images were rendered by post processing. CONTRAST:  4mL MULTIHANCE GADOBENATE DIMEGLUMINE 529 MG/ML IV SOLN COMPARISON:  05/03/2015 abdominal sonogram. FINDINGS: Lower chest: No acute abnormality at the lung bases. Hepatobiliary: Normal liver size and configuration. Minimal diffuse hepatic steatosis. No liver masses. There is a vague small focus of fatty sparing in the subcapsular liver adjacent to the gallbladder fossa (series 16/image 13), which probably accounts for the previously noted sonographic finding. Cholecystectomy. No biliary ductal dilatation. Common bile duct diameter 3 mm. No choledocholithiasis. Pancreas: No pancreatic mass or duct dilation.  No pancreas divisum. Spleen: Normal size. No mass. Adrenals/Urinary Tract: Normal adrenals. No hydronephrosis. Normal kidneys with no renal mass. Stomach/Bowel: Normal non-distended stomach. Visualized small and large bowel is normal  caliber, with no bowel wall thickening. Vascular/Lymphatic: Normal caliber abdominal aorta. Patent portal, splenic, hepatic and renal veins. No pathologically enlarged lymph nodes in the abdomen. Other: No abdominal ascites or focal fluid collection. Musculoskeletal: No aggressive appearing focal osseous lesions. IMPRESSION: 1. No liver masses. 2. Minimal diffuse hepatic steatosis. Small vague focus of fatty sparing in the subcapsular liver adjacent to the gallbladder fossa, which probably accounts for the previously noted sonographic finding. 3. Normal bile ducts status post cholecystectomy. No acute abnormality. Electronically Signed   By: Ilona Sorrel M.D.   On: 03/22/2018 09:25    Assessment & Plan:   Beau-Jacques was seen today for annual exam and hypertension.  Diagnoses and all orders for this visit:  Essential hypertension- His blood pressure is adequately well controlled.  Labs are negative for secondary causes or endorgan damage.  Antihypertensive therapy is not indicated. -     Basic metabolic panel; Future -     CBC with Differential/Platelet; Future -     Hepatic function panel; Future -     TSH; Future -     VITAMIN D 25 Hydroxy (Vit-D Deficiency, Fractures); Future  Routine general medical examination at a health care facility- Exam completed, labs reviewed, vaccines reviewed and updated, cancer screenings are up-to-date, patient education material was given. -     Lipid panel; Future -     PSA; Future -     HIV Antibody (routine testing w rflx); Future  Moderate episode of recurrent major depressive disorder ([place]) -     Ambulatory referral to Psychiatry  Numbness and tingling of both upper extremities- He defers on undergoing a formal electrical study at this time.  The work-up so far reveals that this is likely vitamin B12 deficiency. -     Vitamin B12; Future -     Folate; Future  Vitamin B12 deficiency neuropathy ([place])- I have asked him to start parenteral B12  replacement therapy for this.  I am having Beau-Jacques C. [place]ouffe "Beau" maintain his fish oil-omega-3 fatty acids, fluticasone, Spacer/Aero Chamber Mouthpiece, fluticasone, SUMAtriptan, ALPRAZolam, and albuterol.  No orders of the defined types were placed in this encounter.  In addition to time spent on CPE, I spent 50 minutes in preparing to see the patient by review of recent labs, imaging and procedures,  obtaining and reviewing separately obtained history, communicating with the patient and family or caregiver, ordering medications, tests or procedures, and documenting clinical information in the EHR including the differential Dx, treatment, and any further evaluation and other management of 1. Essential hypertension 2. Moderate episode of recurrent major depressive disorder ([place]) 3. Numbness and tingling of both upper extremities 4. Vitamin B12 deficiency neuropathy ([place])     Follow-up: Return in about 1 year (around 11/26/2020).  Scarlette Calico, MD

## 2019-11-28 DIAGNOSIS — F332 Major depressive disorder, recurrent severe without psychotic features: Secondary | ICD-10-CM | POA: Diagnosis not present

## 2019-11-29 ENCOUNTER — Encounter: Payer: Self-pay | Admitting: Internal Medicine

## 2019-11-29 ENCOUNTER — Other Ambulatory Visit (INDEPENDENT_AMBULATORY_CARE_PROVIDER_SITE_OTHER): Payer: 59

## 2019-11-29 DIAGNOSIS — R200 Anesthesia of skin: Secondary | ICD-10-CM | POA: Insufficient documentation

## 2019-11-29 DIAGNOSIS — I10 Essential (primary) hypertension: Secondary | ICD-10-CM | POA: Diagnosis not present

## 2019-11-29 DIAGNOSIS — Z0000 Encounter for general adult medical examination without abnormal findings: Secondary | ICD-10-CM

## 2019-11-29 DIAGNOSIS — E538 Deficiency of other specified B group vitamins: Secondary | ICD-10-CM | POA: Insufficient documentation

## 2019-11-29 DIAGNOSIS — R202 Paresthesia of skin: Secondary | ICD-10-CM

## 2019-11-29 LAB — VITAMIN D 25 HYDROXY (VIT D DEFICIENCY, FRACTURES): VITD: 60 ng/mL (ref 30.00–100.00)

## 2019-11-29 LAB — BASIC METABOLIC PANEL
BUN: 9 mg/dL (ref 6–23)
CO2: 31 mEq/L (ref 19–32)
Calcium: 9.6 mg/dL (ref 8.4–10.5)
Chloride: 101 mEq/L (ref 96–112)
Creatinine, Ser: 0.94 mg/dL (ref 0.40–1.50)
GFR: 84.51 mL/min (ref >60.00)
Glucose, Bld: 104 mg/dL — ABNORMAL HIGH (ref 70–99)
Potassium: 3.8 mEq/L (ref 3.5–5.1)
Sodium: 139 mEq/L (ref 135–145)

## 2019-11-29 LAB — CBC WITH DIFFERENTIAL/PLATELET
Basophils Absolute: 0.1 10*3/uL (ref 0.0–0.1)
Basophils Relative: 0.8 % (ref 0.0–3.0)
Eosinophils Absolute: 0.2 10*3/uL (ref 0.0–0.7)
Eosinophils Relative: 2.7 % (ref 0.0–5.0)
HCT: 41.6 % (ref 39.0–52.0)
Hemoglobin: 14.5 g/dL (ref 13.0–17.0)
Lymphocytes Relative: 33.6 % (ref 12.0–46.0)
Lymphs Abs: 2.7 10*3/uL (ref 0.7–4.0)
MCHC: 34.9 g/dL (ref 30.0–36.0)
MCV: 86.3 fl (ref 78.0–100.0)
Monocytes Absolute: 0.6 10*3/uL (ref 0.1–1.0)
Monocytes Relative: 7.9 % (ref 3.0–12.0)
Neutro Abs: 4.4 10*3/uL (ref 1.4–7.7)
Neutrophils Relative %: 55 % (ref 43.0–77.0)
Platelets: 311 10*3/uL (ref 150.0–400.0)
RBC: 4.82 Mil/uL (ref 4.22–5.81)
RDW: 13.3 % (ref 11.5–15.5)
WBC: 8.1 10*3/uL (ref 4.0–10.5)

## 2019-11-29 LAB — LIPID PANEL
Cholesterol: 136 mg/dL (ref 0–200)
HDL: 40.5 mg/dL (ref >39.00)
LDL Cholesterol: 64 mg/dL (ref 0–99)
NonHDL: 95.21
Total CHOL/HDL Ratio: 3
Triglycerides: 155 mg/dL — ABNORMAL HIGH (ref 0.0–149.0)
VLDL: 31 mg/dL (ref 0.0–40.0)

## 2019-11-29 LAB — FOLATE: Folate: 11.4 ng/mL (ref >5.9)

## 2019-11-29 LAB — HEPATIC FUNCTION PANEL
ALT: 37 U/L (ref 0–53)
AST: 24 U/L (ref 0–37)
Albumin: 4.7 g/dL (ref 3.5–5.2)
Alkaline Phosphatase: 46 U/L (ref 39–117)
Bilirubin, Direct: 0.2 mg/dL (ref 0.0–0.3)
Total Bilirubin: 0.9 mg/dL (ref 0.2–1.2)
Total Protein: 7.2 g/dL (ref 6.0–8.3)

## 2019-11-29 LAB — TSH: TSH: 2.89 u[IU]/mL (ref 0.35–4.50)

## 2019-11-29 LAB — PSA: PSA: 0.28 ng/mL (ref 0.10–4.00)

## 2019-11-29 LAB — VITAMIN B12: Vitamin B-12: 171 pg/mL — ABNORMAL LOW (ref 211–911)

## 2019-11-30 LAB — HIV ANTIBODY (ROUTINE TESTING W REFLEX): HIV 1&2 Ab, 4th Generation: NONREACTIVE

## 2019-12-04 ENCOUNTER — Other Ambulatory Visit: Payer: Self-pay

## 2019-12-04 ENCOUNTER — Ambulatory Visit (INDEPENDENT_AMBULATORY_CARE_PROVIDER_SITE_OTHER): Payer: 59

## 2019-12-04 DIAGNOSIS — E538 Deficiency of other specified B group vitamins: Secondary | ICD-10-CM

## 2019-12-04 DIAGNOSIS — G63 Polyneuropathy in diseases classified elsewhere: Secondary | ICD-10-CM | POA: Diagnosis not present

## 2019-12-04 MED ORDER — CYANOCOBALAMIN 1000 MCG/ML IJ SOLN
1000.0000 ug | Freq: Once | INTRAMUSCULAR | Status: AC
  Administered 2019-12-04: 1000 ug via INTRAMUSCULAR

## 2019-12-04 NOTE — Progress Notes (Signed)
I have reviewed and agree.

## 2019-12-05 DIAGNOSIS — F332 Major depressive disorder, recurrent severe without psychotic features: Secondary | ICD-10-CM | POA: Diagnosis not present

## 2019-12-05 NOTE — Telephone Encounter (Signed)
Fissa with [facility]

## 2019-12-06 DIAGNOSIS — F3181 Bipolar II disorder: Secondary | ICD-10-CM | POA: Diagnosis not present

## 2019-12-06 DIAGNOSIS — J3081 Allergic rhinitis due to animal (cat) (dog) hair and dander: Secondary | ICD-10-CM | POA: Diagnosis not present

## 2019-12-06 DIAGNOSIS — F4310 Post-traumatic stress disorder, unspecified: Secondary | ICD-10-CM | POA: Diagnosis not present

## 2019-12-06 DIAGNOSIS — J3089 Other allergic rhinitis: Secondary | ICD-10-CM | POA: Diagnosis not present

## 2019-12-06 DIAGNOSIS — J301 Allergic rhinitis due to pollen: Secondary | ICD-10-CM | POA: Diagnosis not present

## 2019-12-11 ENCOUNTER — Other Ambulatory Visit: Payer: Self-pay

## 2019-12-11 ENCOUNTER — Ambulatory Visit (INDEPENDENT_AMBULATORY_CARE_PROVIDER_SITE_OTHER): Payer: 59 | Admitting: *Deleted

## 2019-12-11 DIAGNOSIS — G63 Polyneuropathy in diseases classified elsewhere: Secondary | ICD-10-CM | POA: Diagnosis not present

## 2019-12-11 DIAGNOSIS — E538 Deficiency of other specified B group vitamins: Secondary | ICD-10-CM

## 2019-12-11 MED ORDER — CYANOCOBALAMIN 1000 MCG/ML IJ SOLN
1000.0000 ug | Freq: Once | INTRAMUSCULAR | Status: AC
  Administered 2019-12-11: 1000 ug via INTRAMUSCULAR

## 2019-12-11 NOTE — Progress Notes (Addendum)
I have reviewed and agree  Pls cosign for B12 inj../lmb  

## 2019-12-15 DIAGNOSIS — J301 Allergic rhinitis due to pollen: Secondary | ICD-10-CM | POA: Diagnosis not present

## 2019-12-15 DIAGNOSIS — J3081 Allergic rhinitis due to animal (cat) (dog) hair and dander: Secondary | ICD-10-CM | POA: Diagnosis not present

## 2019-12-15 DIAGNOSIS — J3089 Other allergic rhinitis: Secondary | ICD-10-CM | POA: Diagnosis not present

## 2019-12-21 ENCOUNTER — Ambulatory Visit (INDEPENDENT_AMBULATORY_CARE_PROVIDER_SITE_OTHER): Payer: 59

## 2019-12-21 ENCOUNTER — Other Ambulatory Visit: Payer: Self-pay

## 2019-12-21 DIAGNOSIS — E538 Deficiency of other specified B group vitamins: Secondary | ICD-10-CM | POA: Diagnosis not present

## 2019-12-21 DIAGNOSIS — G63 Polyneuropathy in diseases classified elsewhere: Secondary | ICD-10-CM

## 2019-12-21 MED ORDER — CYANOCOBALAMIN 1000 MCG/ML IJ SOLN
1000.0000 ug | INTRAMUSCULAR | Status: DC
  Administered 2019-12-21: 1000 ug via INTRAMUSCULAR

## 2019-12-21 NOTE — Progress Notes (Signed)
Pt here for weekly B12 injection per Dr Ronnald Ramp.  B12 1052mcg given IM left deltoid and pt tolerated injection well.  Next B12 injection scheduled for 12/27/19, then pt will schedule monthly, indefinitely per MD order.

## 2019-12-25 ENCOUNTER — Telehealth (HOSPITAL_COMMUNITY): Payer: Self-pay

## 2019-12-27 ENCOUNTER — Other Ambulatory Visit: Payer: Self-pay

## 2019-12-27 ENCOUNTER — Ambulatory Visit (INDEPENDENT_AMBULATORY_CARE_PROVIDER_SITE_OTHER): Payer: 59 | Admitting: *Deleted

## 2019-12-27 DIAGNOSIS — E538 Deficiency of other specified B group vitamins: Secondary | ICD-10-CM | POA: Diagnosis not present

## 2019-12-27 DIAGNOSIS — G63 Polyneuropathy in diseases classified elsewhere: Secondary | ICD-10-CM | POA: Diagnosis not present

## 2019-12-27 MED ORDER — CYANOCOBALAMIN 1000 MCG/ML IJ SOLN
1000.0000 ug | Freq: Once | INTRAMUSCULAR | Status: AC
  Administered 2019-12-27: 1000 ug via INTRAMUSCULAR

## 2019-12-27 NOTE — Progress Notes (Signed)
Pls cosign for B12 inj../lmb  

## 2020-01-01 ENCOUNTER — Encounter (HOSPITAL_COMMUNITY): Payer: Self-pay | Admitting: Psychiatry

## 2020-01-01 ENCOUNTER — Other Ambulatory Visit: Payer: Self-pay

## 2020-01-01 ENCOUNTER — Ambulatory Visit (INDEPENDENT_AMBULATORY_CARE_PROVIDER_SITE_OTHER): Payer: 59 | Admitting: Psychiatry

## 2020-01-01 VITALS — BP 135/90 | HR 72 | Temp 98.6°F | Ht 71.0 in | Wt 193.5 lb

## 2020-01-01 DIAGNOSIS — F331 Major depressive disorder, recurrent, moderate: Secondary | ICD-10-CM | POA: Diagnosis not present

## 2020-01-01 DIAGNOSIS — F4310 Post-traumatic stress disorder, unspecified: Secondary | ICD-10-CM | POA: Diagnosis not present

## 2020-01-01 DIAGNOSIS — J3089 Other allergic rhinitis: Secondary | ICD-10-CM | POA: Diagnosis not present

## 2020-01-01 DIAGNOSIS — J301 Allergic rhinitis due to pollen: Secondary | ICD-10-CM | POA: Diagnosis not present

## 2020-01-01 DIAGNOSIS — J3081 Allergic rhinitis due to animal (cat) (dog) hair and dander: Secondary | ICD-10-CM | POA: Diagnosis not present

## 2020-01-01 DIAGNOSIS — Z8719 Personal history of other diseases of the digestive system: Secondary | ICD-10-CM | POA: Insufficient documentation

## 2020-01-01 DIAGNOSIS — Z9049 Acquired absence of other specified parts of digestive tract: Secondary | ICD-10-CM | POA: Insufficient documentation
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
## 2020-01-08 ENCOUNTER — Other Ambulatory Visit (HOSPITAL_COMMUNITY): Payer: 59 | Attending: Psychiatry

## 2020-01-08 ENCOUNTER — Other Ambulatory Visit: Payer: Self-pay

## 2020-01-08 VITALS — BP 90/60 | HR 73

## 2020-01-08 DIAGNOSIS — F29 Unspecified psychosis not due to a substance or known physiological condition: Secondary | ICD-10-CM | POA: Diagnosis not present

## 2020-01-08 DIAGNOSIS — E161 Other hypoglycemia: Secondary | ICD-10-CM | POA: Diagnosis not present

## 2020-01-08 DIAGNOSIS — F332 Major depressive disorder, recurrent severe without psychotic features: Secondary | ICD-10-CM | POA: Diagnosis not present

## 2020-01-08 DIAGNOSIS — I213 ST elevation (STEMI) myocardial infarction of unspecified site: Secondary | ICD-10-CM | POA: Diagnosis not present

## 2020-01-08 DIAGNOSIS — R55 Syncope and collapse: Secondary | ICD-10-CM | POA: Diagnosis not present

## 2020-01-08 DIAGNOSIS — E162 Hypoglycemia, unspecified: Secondary | ICD-10-CM | POA: Diagnosis not present

## 2020-01-08 NOTE — Progress Notes (Signed)
Pt reported to [facility] for cortical mapping and motor threshold determination for Repetitive Transcranial Magnetic Stimulation treatment for Major Depressive Disorder. Pt completed a PHQ-9 with a score of 13. Pt also completed a Beck's Depression Inventory with a score of 24. Prior to procedure, pt signed an informed consent agreement for [place] treatment. Pt's treatment area was found by applying single pulses to her left motor cortex, hunting along the anterior/posterior plane and along the superior oblique angle until the best motor response was elicited from the pt's right thumb. The best response was observed at 7cm A/P and 35 degrees SOA, with a coil angle of 5 degrees. Pt's motor threshold was calculated using the [employer]'s proprietary MT Assist algorithm, which produced a calculated motor threshold of 1.30 SMT. Per these findings, pt's treatment parameters are as follows: A/P -- 12.5 cm, SOA -- 35 degrees, Coil Angle -- -5 degrees, Motor Threshold -- 1.30 SMT. With these parameters: 3000 pulses per session, with stimulation in bursts of pulses lasting 4 seconds at a frequency of 10 Hz, separated by 26 seconds of rest. After determining pt's tx parameters, coil was moved to the treatment location, and the first burst of pulses was applied at a reduced power of 80% MT.   Pt could not tolerate the treatment. Pt stated he was nauseous four minutes into the treatment. NT paused the treatment session. Pt became pale and his eyes rolled back. Pt showed what appeared to be seizure like activity. Pt was not responsive. Help was called. Support staff arrived at which time vital signs and CBG are obtained. 90/60, CBG: 73. At conclusion of seizure like activity pt was alert and oriented. He appears diaphoretic though is able to answer all questions asked by MD. He expresses a strong desire to not be sent out via EMS. Pt will not be returning for [place] treatment.

## 2020-01-08 NOTE — Progress Notes (Deleted)
Pt reported to [facility] for cortical mapping and motor threshold determination for Repetitive Transcranial Magnetic Stimulation treatment for Major Depressive Disorder. Pt completed a PHQ-9 with a score of 13. Pt also completed a Beck's Depression Inventory with a score of 24. Prior to procedure, pt signed an informed consent agreement for [place] treatment. Pt's treatment area was found by applying single pulses to her left motor cortex, hunting along the anterior/posterior plane and along the superior oblique angle until the best motor response was elicited from the pt's right thumb. The best response was observed at 7cm A/P and 35 degrees SOA, with a coil angle of 5 degrees. Pt's motor threshold was calculated using the [employer]'s proprietary MT Assist algorithm, which produced a calculated motor threshold of 1.30 SMT. Per these findings, pt's treatment parameters are as follows: A/P -- 12.5 cm, SOA -- 35 degrees, Coil Angle -- -5 degrees, Motor Threshold -- 1.30 SMT. With these parameters: 3000 pulses per session, with stimulation in bursts of pulses lasting 4 seconds at a frequency of 10 Hz, separated by 26 seconds of rest. After determining pt's tx parameters, coil was moved to the treatment location, and the first burst of pulses was applied at a reduced power of 80% MT.   Pt could not tolerate the treatment. Pt stated he was nauseous four minutes into the treatment. NT paused the treatment session. Pt became pale and his eyes rolled back. Pt showed what appeared to be seizure like activity. Pt was not responsive. Help was called. Support staff arrived at which time vital signs and CBG are obtained. 90/60, CBG: 73. At conclusion of seizure like activity pt was alert and oriented. He appears diaphoretic though is able to answer all questions asked by MD. He expresses a strong desire to not be sent out via EMS. Pt will not be returning for [place] treatment.

## 2020-01-09 ENCOUNTER — Encounter (HOSPITAL_COMMUNITY): Payer: 59

## 2020-01-09 ENCOUNTER — Encounter (HOSPITAL_COMMUNITY): Payer: Self-pay

## 2020-01-09 NOTE — Telephone Encounter (Signed)
Pt scheduled for [place].

## 2020-01-10 ENCOUNTER — Encounter (HOSPITAL_COMMUNITY): Payer: 59

## 2020-01-11 ENCOUNTER — Encounter (HOSPITAL_COMMUNITY): Payer: 59

## 2020-01-12 ENCOUNTER — Encounter (HOSPITAL_COMMUNITY): Payer: 59

## 2020-01-12 DIAGNOSIS — J3089 Other allergic rhinitis: Secondary | ICD-10-CM | POA: Diagnosis not present

## 2020-01-12 DIAGNOSIS — J301 Allergic rhinitis due to pollen: Secondary | ICD-10-CM | POA: Diagnosis not present

## 2020-01-12 DIAGNOSIS — J3081 Allergic rhinitis due to animal (cat) (dog) hair and dander: Secondary | ICD-10-CM | POA: Diagnosis not present

## 2020-01-16 DIAGNOSIS — J301 Allergic rhinitis due to pollen: Secondary | ICD-10-CM | POA: Diagnosis not present

## 2020-01-16 DIAGNOSIS — J3089 Other allergic rhinitis: Secondary | ICD-10-CM | POA: Diagnosis not present

## 2020-01-16 DIAGNOSIS — J3081 Allergic rhinitis due to animal (cat) (dog) hair and dander: Secondary | ICD-10-CM | POA: Diagnosis not present

## 2020-01-23 DIAGNOSIS — F3181 Bipolar II disorder: Secondary | ICD-10-CM | POA: Diagnosis not present

## 2020-01-23 DIAGNOSIS — F4310 Post-traumatic stress disorder, unspecified: Secondary | ICD-10-CM | POA: Diagnosis not present

## 2020-01-24 ENCOUNTER — Other Ambulatory Visit: Payer: Self-pay

## 2020-01-24 ENCOUNTER — Ambulatory Visit (INDEPENDENT_AMBULATORY_CARE_PROVIDER_SITE_OTHER): Payer: 59 | Admitting: *Deleted

## 2020-01-24 DIAGNOSIS — G63 Polyneuropathy in diseases classified elsewhere: Secondary | ICD-10-CM

## 2020-01-24 DIAGNOSIS — E538 Deficiency of other specified B group vitamins: Secondary | ICD-10-CM

## 2020-01-24 MED ORDER — CYANOCOBALAMIN 1000 MCG/ML IJ SOLN
1000.0000 ug | Freq: Once | INTRAMUSCULAR | Status: AC
  Administered 2020-01-24: 1000 ug via INTRAMUSCULAR

## 2020-01-24 NOTE — Progress Notes (Signed)
Pls cosign for B12 inj../lmb  

## 2020-01-30 ENCOUNTER — Encounter: Payer: Self-pay | Admitting: Internal Medicine

## 2020-02-01 DIAGNOSIS — J3089 Other allergic rhinitis: Secondary | ICD-10-CM | POA: Diagnosis not present

## 2020-02-01 DIAGNOSIS — J301 Allergic rhinitis due to pollen: Secondary | ICD-10-CM | POA: Diagnosis not present

## 2020-02-01 DIAGNOSIS — J3081 Allergic rhinitis due to animal (cat) (dog) hair and dander: Secondary | ICD-10-CM | POA: Diagnosis not present

## 2020-02-09 DIAGNOSIS — J3089 Other allergic rhinitis: Secondary | ICD-10-CM | POA: Diagnosis not present

## 2020-02-09 DIAGNOSIS — J301 Allergic rhinitis due to pollen: Secondary | ICD-10-CM | POA: Diagnosis not present

## 2020-02-09 DIAGNOSIS — J3081 Allergic rhinitis due to animal (cat) (dog) hair and dander: Secondary | ICD-10-CM | POA: Diagnosis not present

## 2020-02-16 DIAGNOSIS — J301 Allergic rhinitis due to pollen: Secondary | ICD-10-CM | POA: Diagnosis not present

## 2020-02-16 DIAGNOSIS — J3089 Other allergic rhinitis: Secondary | ICD-10-CM | POA: Diagnosis not present

## 2020-02-16 DIAGNOSIS — J3081 Allergic rhinitis due to animal (cat) (dog) hair and dander: Secondary | ICD-10-CM | POA: Diagnosis not present

## 2020-02-20 DIAGNOSIS — F4310 Post-traumatic stress disorder, unspecified: Secondary | ICD-10-CM | POA: Diagnosis not present

## 2020-02-20 DIAGNOSIS — F3181 Bipolar II disorder: Secondary | ICD-10-CM | POA: Diagnosis not present

## 2020-02-26 ENCOUNTER — Other Ambulatory Visit: Payer: Self-pay

## 2020-02-26 ENCOUNTER — Ambulatory Visit (INDEPENDENT_AMBULATORY_CARE_PROVIDER_SITE_OTHER): Payer: 59

## 2020-02-26 DIAGNOSIS — G63 Polyneuropathy in diseases classified elsewhere: Secondary | ICD-10-CM | POA: Diagnosis not present

## 2020-02-26 DIAGNOSIS — E538 Deficiency of other specified B group vitamins: Secondary | ICD-10-CM | POA: Diagnosis not present

## 2020-02-26 MED ORDER — CYANOCOBALAMIN 1000 MCG/ML IJ SOLN
1000.0000 ug | Freq: Once | INTRAMUSCULAR | Status: AC
  Administered 2020-02-26: 1000 ug via INTRAMUSCULAR

## 2020-02-26 NOTE — Progress Notes (Signed)
Pt given Vitamin B12. Pt tolerated well.

## 2020-03-01 DIAGNOSIS — J301 Allergic rhinitis due to pollen: Secondary | ICD-10-CM | POA: Diagnosis not present

## 2020-03-01 DIAGNOSIS — J3081 Allergic rhinitis due to animal (cat) (dog) hair and dander: Secondary | ICD-10-CM | POA: Diagnosis not present

## 2020-03-01 DIAGNOSIS — J3089 Other allergic rhinitis: Secondary | ICD-10-CM | POA: Diagnosis not present

## 2020-03-08 DIAGNOSIS — J3081 Allergic rhinitis due to animal (cat) (dog) hair and dander: Secondary | ICD-10-CM | POA: Diagnosis not present

## 2020-03-08 DIAGNOSIS — J301 Allergic rhinitis due to pollen: Secondary | ICD-10-CM | POA: Diagnosis not present

## 2020-03-08 DIAGNOSIS — J3089 Other allergic rhinitis: Secondary | ICD-10-CM | POA: Diagnosis not present

## 2020-03-13 DIAGNOSIS — J3089 Other allergic rhinitis: Secondary | ICD-10-CM | POA: Diagnosis not present

## 2020-03-13 DIAGNOSIS — J3081 Allergic rhinitis due to animal (cat) (dog) hair and dander: Secondary | ICD-10-CM | POA: Diagnosis not present

## 2020-03-13 DIAGNOSIS — J301 Allergic rhinitis due to pollen: Secondary | ICD-10-CM | POA: Diagnosis not present

## 2020-04-02 DIAGNOSIS — F4310 Post-traumatic stress disorder, unspecified: Secondary | ICD-10-CM | POA: Diagnosis not present

## 2020-04-02 DIAGNOSIS — F3181 Bipolar II disorder: Secondary | ICD-10-CM | POA: Diagnosis not present

## 2020-04-04 ENCOUNTER — Other Ambulatory Visit (HOSPITAL_COMMUNITY): Payer: Self-pay | Admitting: Allergy and Immunology

## 2020-04-05 DIAGNOSIS — J301 Allergic rhinitis due to pollen: Secondary | ICD-10-CM | POA: Diagnosis not present

## 2020-04-05 DIAGNOSIS — J3081 Allergic rhinitis due to animal (cat) (dog) hair and dander: Secondary | ICD-10-CM | POA: Diagnosis not present

## 2020-04-05 DIAGNOSIS — J3089 Other allergic rhinitis: Secondary | ICD-10-CM | POA: Diagnosis not present

## 2020-04-11 DIAGNOSIS — F332 Major depressive disorder, recurrent severe without psychotic features: Secondary | ICD-10-CM | POA: Diagnosis not present

## 2020-05-02 DIAGNOSIS — F332 Major depressive disorder, recurrent severe without psychotic features: Secondary | ICD-10-CM | POA: Diagnosis not present

## 2020-05-07 ENCOUNTER — Other Ambulatory Visit: Payer: Self-pay | Admitting: Internal Medicine

## 2020-05-07 ENCOUNTER — Telehealth: Payer: Self-pay | Admitting: Internal Medicine

## 2020-05-07 DIAGNOSIS — J4530 Mild persistent asthma, uncomplicated: Secondary | ICD-10-CM

## 2020-05-07 MED ORDER — FLOVENT HFA 110 MCG/ACT IN AERO: 1.0000 | INHALATION_SPRAY | Freq: Two times a day (BID) | RESPIRATORY_TRACT | 5 refills | Status: DC | PRN

## 2020-05-07 NOTE — Telephone Encounter (Signed)
    1.Medication Requested:albuterol (VENTOLIN HFA) 108 (90 Base) MCG/ACT inhaler  2. Pharmacy (Name, Street, [place]):[place], [place] - [address]  3. On Med List: YES  4. Last Visit with PCP: 11/27/19  5. Next visit date with PCP: N/A   Agent: Please be advised that RX refills may take up to 3 business days. We ask that you follow-up with your pharmacy.

## 2020-05-08 DIAGNOSIS — J3081 Allergic rhinitis due to animal (cat) (dog) hair and dander: Secondary | ICD-10-CM | POA: Diagnosis not present

## 2020-05-08 DIAGNOSIS — J3089 Other allergic rhinitis: Secondary | ICD-10-CM | POA: Diagnosis not present

## 2020-05-08 DIAGNOSIS — J301 Allergic rhinitis due to pollen: Secondary | ICD-10-CM | POA: Diagnosis not present

## 2020-05-16 DIAGNOSIS — J3081 Allergic rhinitis due to animal (cat) (dog) hair and dander: Secondary | ICD-10-CM | POA: Diagnosis not present

## 2020-05-16 DIAGNOSIS — J3089 Other allergic rhinitis: Secondary | ICD-10-CM | POA: Diagnosis not present

## 2020-05-16 DIAGNOSIS — J301 Allergic rhinitis due to pollen: Secondary | ICD-10-CM | POA: Diagnosis not present

## 2020-05-27 DIAGNOSIS — J3081 Allergic rhinitis due to animal (cat) (dog) hair and dander: Secondary | ICD-10-CM | POA: Diagnosis not present

## 2020-05-27 DIAGNOSIS — J3089 Other allergic rhinitis: Secondary | ICD-10-CM | POA: Diagnosis not present

## 2020-05-27 DIAGNOSIS — J301 Allergic rhinitis due to pollen: Secondary | ICD-10-CM | POA: Diagnosis not present

## 2020-05-30 DIAGNOSIS — F332 Major depressive disorder, recurrent severe without psychotic features: Secondary | ICD-10-CM | POA: Diagnosis not present

## 2020-05-30 DIAGNOSIS — H02811 Retained foreign body in right upper eyelid: Secondary | ICD-10-CM | POA: Diagnosis not present

## 2020-06-20 ENCOUNTER — Encounter: Payer: Self-pay | Admitting: Internal Medicine

## 2020-06-20 ENCOUNTER — Ambulatory Visit: Payer: 59 | Admitting: Internal Medicine

## 2020-06-20 ENCOUNTER — Other Ambulatory Visit: Payer: Self-pay

## 2020-06-20 VITALS — BP 126/82 | HR 80 | Temp 98.7°F | Resp 16 | Ht 71.0 in | Wt 189.0 lb

## 2020-06-20 DIAGNOSIS — K2100 Gastro-esophageal reflux disease with esophagitis, without bleeding: Secondary | ICD-10-CM

## 2020-06-20 DIAGNOSIS — G43009 Migraine without aura, not intractable, without status migrainosus: Secondary | ICD-10-CM

## 2020-06-20 DIAGNOSIS — G63 Polyneuropathy in diseases classified elsewhere: Secondary | ICD-10-CM

## 2020-06-20 DIAGNOSIS — M5412 Radiculopathy, cervical region: Secondary | ICD-10-CM | POA: Insufficient documentation

## 2020-06-20 DIAGNOSIS — E538 Deficiency of other specified B group vitamins: Secondary | ICD-10-CM | POA: Diagnosis not present

## 2020-06-20 DIAGNOSIS — I10 Essential (primary) hypertension: Secondary | ICD-10-CM | POA: Diagnosis not present

## 2020-06-20 DIAGNOSIS — R10816 Epigastric abdominal tenderness: Secondary | ICD-10-CM

## 2020-06-20 DIAGNOSIS — K222 Esophageal obstruction: Secondary | ICD-10-CM

## 2020-06-20 LAB — CBC WITH DIFFERENTIAL/PLATELET
Basophils Absolute: 0.1 10*3/uL (ref 0.0–0.1)
Basophils Relative: 1.2 % (ref 0.0–3.0)
Eosinophils Absolute: 0.3 10*3/uL (ref 0.0–0.7)
Eosinophils Relative: 4.2 % (ref 0.0–5.0)
HCT: 44.1 % (ref 39.0–52.0)
Hemoglobin: 15.5 g/dL (ref 13.0–17.0)
Lymphocytes Relative: 29.2 % (ref 12.0–46.0)
Lymphs Abs: 2.3 10*3/uL (ref 0.7–4.0)
MCHC: 35.2 g/dL (ref 30.0–36.0)
MCV: 84.4 fl (ref 78.0–100.0)
Monocytes Absolute: 0.7 10*3/uL (ref 0.1–1.0)
Monocytes Relative: 9.2 % (ref 3.0–12.0)
Neutro Abs: 4.5 10*3/uL (ref 1.4–7.7)
Neutrophils Relative %: 56.2 % (ref 43.0–77.0)
Platelets: 342 10*3/uL (ref 150.0–400.0)
RBC: 5.23 Mil/uL (ref 4.22–5.81)
RDW: 13.2 % (ref 11.5–15.5)
WBC: 8 10*3/uL (ref 4.0–10.5)

## 2020-06-20 LAB — BASIC METABOLIC PANEL
BUN: 9 mg/dL (ref 6–23)
CO2: 29 mEq/L (ref 19–32)
Calcium: 9.8 mg/dL (ref 8.4–10.5)
Chloride: 103 mEq/L (ref 96–112)
Creatinine, Ser: 1.16 mg/dL (ref 0.40–1.50)
GFR: 72.76 mL/min (ref >60.00)
Glucose, Bld: 95 mg/dL (ref 70–99)
Potassium: 3.7 mEq/L (ref 3.5–5.1)
Sodium: 139 mEq/L (ref 135–145)

## 2020-06-20 LAB — HEPATIC FUNCTION PANEL
ALT: 27 U/L (ref 0–53)
AST: 24 U/L (ref 0–37)
Albumin: 5 g/dL (ref 3.5–5.2)
Alkaline Phosphatase: 50 U/L (ref 39–117)
Bilirubin, Direct: 0.2 mg/dL (ref 0.0–0.3)
Total Bilirubin: 1.1 mg/dL (ref 0.2–1.2)
Total Protein: 7.9 g/dL (ref 6.0–8.3)

## 2020-06-20 LAB — AMYLASE: Amylase: 54 U/L (ref 27–131)

## 2020-06-20 LAB — LIPASE: Lipase: 49 U/L (ref 11.0–59.0)

## 2020-06-20 LAB — FOLATE: Folate: 16.8 ng/mL (ref >5.9)

## 2020-06-20 MED ORDER — ESOMEPRAZOLE MAGNESIUM 40 MG PO CPDR
40.0000 mg | DELAYED_RELEASE_CAPSULE | Freq: Every day | ORAL | 1 refills | Status: DC
Start: 2020-06-20 — End: 2021-11-02

## 2020-06-20 MED ORDER — UBRELVY 100 MG PO TABS: 1.0000 | ORAL_TABLET | Freq: Every day | ORAL | 1 refills | Status: DC | PRN

## 2020-06-20 MED ORDER — CYANOCOBALAMIN 1000 MCG/ML IJ SOLN
1000.0000 ug | Freq: Once | INTRAMUSCULAR | Status: AC
Start: 2020-06-20 — End: 2020-06-20
  Administered 2020-06-20: 1000 ug via INTRAMUSCULAR

## 2020-06-20 NOTE — Patient Instructions (Signed)
Abdominal Pain, Adult Pain in the abdomen (abdominal pain) can be caused by many things. Often, abdominal pain is not serious and it gets better with no treatment or by being treated at home. However, sometimes abdominal pain is serious. Your health care provider will ask questions about your medical history and do a physical exam to try to determine the cause of your abdominal pain. Follow these instructions at home:  Medicines  Take over-the-counter and prescription medicines only as told by your health care provider.  Do not take a laxative unless told by your health care provider. General instructions  Watch your condition for any changes.  Drink enough fluid to keep your urine pale yellow.  Keep all follow-up visits as told by your health care provider. This is important. Contact a health care provider if:  Your abdominal pain changes or gets worse.  You are not hungry or you lose weight without trying.  You are constipated or have diarrhea for more than 2-3 days.  You have pain when you urinate or have a bowel movement.  Your abdominal pain wakes you up at night.  Your pain gets worse with meals, after eating, or with certain foods.  You are vomiting and cannot keep anything down.  You have a fever.  You have blood in your urine. Get help right away if:  Your pain does not go away as soon as your health care provider told you to expect.  You cannot stop vomiting.  Your pain is only in areas of the abdomen, such as the right side or the left lower portion of the abdomen. Pain on the right side could be caused by appendicitis.  You have bloody or black stools, or stools that look like tar.  You have severe pain, cramping, or bloating in your abdomen.  You have signs of dehydration, such as: ? Dark urine, very little urine, or no urine. ? Cracked lips. ? Dry mouth. ? Sunken eyes. ? Sleepiness. ? Weakness.  You have trouble breathing or chest  pain. Summary  Often, abdominal pain is not serious and it gets better with no treatment or by being treated at home. However, sometimes abdominal pain is serious.  Watch your condition for any changes.  Take over-the-counter and prescription medicines only as told by your health care provider.  Contact a health care provider if your abdominal pain changes or gets worse.  Get help right away if you have severe pain, cramping, or bloating in your abdomen. This information is not intended to replace advice given to you by your health care provider. Make sure you discuss any questions you have with your health care provider. Document Revised: 10/10/2018 Document Reviewed: 10/10/2018 Elsevier Patient Education  2020 [employer]  

## 2020-06-20 NOTE — Progress Notes (Signed)
Subjective:  Patient ID: Derek Tucker, male    DOB: Mar 18, 1971  Age: 50 y.o. MRN: [id number]  CC: Abdominal Pain  This visit occurred during the SARS-CoV-2 public health emergency.  Safety protocols were in place, including screening questions prior to the visit, additional usage of staff PPE, and extensive cleaning of exam room while observing appropriate contact time as indicated for disinfecting solutions.    HPI Derek Tucker presents for f/up -   1.  He has a history of B12 deficiency but has not received a B12 injection in several months.  2.  He complains of right-sided neck pain that radiates into his right upper extremity with numbness, weakness, and tingling in the right upper extremity.  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  I do not have any records of that MRI report.  He denies any recent injury or trauma.  He is controlling the pain with Tylenol and Motrin.  3.  He complains of for the last 6 months he has pain in his epigastric region whenever he eats.  He says the pain is very brief and he describes it as a sharp and shooting sensation.  He has lost about 7 pounds.  He denies odynophagia or dysphagia.  He has mild heartburn.  He has had his gallbladder removed.  He tells me that years ago he had a stricture in his GE junction and underwent a dilation.  An H2 blocker has been prescribed but he is not taking it.  4.  He has migraine headache about every 1 to 2 months.  He previously took [place] and had a good response.  He wants a prescription for Ubrelvy.   Outpatient Medications Prior to Visit  Medication Sig Dispense Refill   albuterol (VENTOLIN HFA) 108 (90 Base) MCG/ACT inhaler INHALE 2 PUFFS INTO THE LUNGS EVERY 6 HOURS AS NEEDED FOR WHEEZING. 18 g 0   famotidine (PEPCID) 20 MG tablet Take 20 mg by mouth 3 (three) times daily as needed.     fish oil-omega-3 fatty  acids 1000 MG capsule Take 2 g by mouth daily.     fluticasone (FLONASE) 50 MCG/ACT nasal spray Place 4 sprays into the nose daily. 16 g 11   fluticasone (FLOVENT HFA) 110 MCG/ACT inhaler Inhale 1 puff into the lungs 2 (two) times daily as needed. 12 g 5   Spacer/Aero Chamber Mouthpiece MISC 1 Act by Does not apply route 2 (two) times daily. 1 each 11   ALPRAZolam (XANAX) 0.25 MG tablet Take 1 tablet by mouth twice daily as needed for anxiety 10 tablet 0   SUMAtriptan (IMITREX) 100 MG tablet Take 1 tablet (100 mg total) by mouth every 2 (two) hours as needed for migraine. May repeat in 2 hours if headache persists or recurs. 10 tablet 5   No facility-administered medications prior to visit.    ROS Review of Systems  Constitutional: Positive for unexpected weight change. Negative for appetite change, chills, diaphoresis, fatigue and fever.  HENT: Negative.   Eyes: Negative.   Respiratory: Negative for cough, chest tightness, shortness of breath and wheezing.   Cardiovascular: Negative for chest pain, palpitations and leg swelling.  Gastrointestinal: Positive for abdominal pain. Negative for blood in stool, constipation, diarrhea, nausea and vomiting.  Endocrine: Negative.   Genitourinary: Negative.  Negative for difficulty urinating, flank pain and hematuria.  Musculoskeletal: Positive for  neck pain. Negative for arthralgias.  Skin: Negative.  Negative for color change.  Neurological: Positive for weakness, numbness and headaches. Negative for dizziness, tremors and speech difficulty.  Hematological: Negative for adenopathy. Does not bruise/bleed easily.  Psychiatric/Behavioral: Negative.     Objective:  BP 126/82    Pulse 80    Temp 98.7 F (37.1 C) (Oral)    Resp 16    Ht 5\' 11"  (1.803 m)    Wt 189 lb (85.7 kg)    SpO2 97%    BMI 26.36 kg/m   BP Readings from Last 3 Encounters:  06/20/20 126/82  11/27/19 134/82  03/08/18 (!) 138/92    Wt Readings from Last 3 Encounters:   06/20/20 189 lb (85.7 kg)  11/27/19 192 lb 6 oz (87.3 kg)  03/08/18 194 lb 8 oz (88.2 kg)    Physical Exam Vitals reviewed.  Constitutional:      Appearance: Normal appearance. He is not ill-appearing.  HENT:     Nose: Nose normal.     Mouth/Throat:     Mouth: Mucous membranes are moist.  Eyes:     General: No scleral icterus.    Conjunctiva/sclera: Conjunctivae normal.  Cardiovascular:     Rate and Rhythm: Normal rate and regular rhythm.     Heart sounds: No murmur heard.   Pulmonary:     Effort: Pulmonary effort is normal.     Breath sounds: No stridor. No wheezing, rhonchi or rales.  Abdominal:     General: Abdomen is flat. There is no distension.     Palpations: There is no hepatomegaly, splenomegaly or mass.     Tenderness: There is abdominal tenderness in the epigastric area. There is no guarding or rebound.     Hernia: No hernia is present.  Genitourinary:    Comments: He refused a rectal exam to check a stool Hemoccult. Musculoskeletal:        General: Normal range of motion.     Cervical back: Neck supple.     Right lower leg: No edema.     Left lower leg: No edema.  Lymphadenopathy:     Cervical: No cervical adenopathy.  Skin:    General: Skin is warm and dry.     Coloration: Skin is not pale.  Neurological:     General: No focal deficit present.     Mental Status: He is alert and oriented to person, place, and time.     Cranial Nerves: No cranial nerve deficit.     Sensory: No sensory deficit.     Motor: No weakness.     Coordination: Coordination normal.     Gait: Gait normal.     Deep Tendon Reflexes: Reflexes abnormal.     Reflex Scores:      Tricep reflexes are 1+ on the right side and 1+ on the left side.      Bicep reflexes are 1+ on the right side and 1+ on the left side.      Brachioradialis reflexes are 0 on the right side and 1+ on the left side.      Patellar reflexes are 1+ on the right side and 1+ on the left side.      Achilles  reflexes are 1+ on the right side and 1+ on the left side. Psychiatric:        Mood and Affect: Mood normal.        Behavior: Behavior normal.     Lab Results  Component Value  Date   WBC 8.1 11/29/2019   HGB 14.5 11/29/2019   HCT 41.6 11/29/2019   PLT 311.0 11/29/2019   GLUCOSE 104 (H) 11/29/2019   CHOL 136 11/29/2019   TRIG 155.0 (H) 11/29/2019   HDL 40.50 11/29/2019   LDLDIRECT 85.0 01/19/2017   LDLCALC 64 11/29/2019   ALT 37 11/29/2019   AST 24 11/29/2019   NA 139 11/29/2019   K 3.8 11/29/2019   CL 101 11/29/2019   CREATININE 0.94 11/29/2019   BUN 9 11/29/2019   CO2 31 11/29/2019   TSH 2.89 11/29/2019   PSA 0.28 11/29/2019    MR ABDOMEN MRCP W WO CONTAST  Result Date: 03/22/2018 CLINICAL DATA:  Indeterminate liver lesion on prior abdominal sonogram. Right upper quadrant abdominal tenderness. Cholecystectomy 3 years prior. EXAM: MRI ABDOMEN WITHOUT AND WITH CONTRAST (INCLUDING MRCP) TECHNIQUE: Multiplanar multisequence MR imaging of the abdomen was performed both before and after the administration of intravenous contrast. Heavily T2-weighted images of the biliary and pancreatic ducts were obtained, and three-dimensional MRCP images were rendered by post processing. CONTRAST:  15mL MULTIHANCE GADOBENATE DIMEGLUMINE 529 MG/ML IV SOLN COMPARISON:  05/03/2015 abdominal sonogram. FINDINGS: Lower chest: No acute abnormality at the lung bases. Hepatobiliary: Normal liver size and configuration. Minimal diffuse hepatic steatosis. No liver masses. There is a vague small focus of fatty sparing in the subcapsular liver adjacent to the gallbladder fossa (series 16/image 13), which probably accounts for the previously noted sonographic finding. Cholecystectomy. No biliary ductal dilatation. Common bile duct diameter 3 mm. No choledocholithiasis. Pancreas: No pancreatic mass or duct dilation.  No pancreas divisum. Spleen: Normal size. No mass. Adrenals/Urinary Tract: Normal adrenals. No  hydronephrosis. Normal kidneys with no renal mass. Stomach/Bowel: Normal non-distended stomach. Visualized small and large bowel is normal caliber, with no bowel wall thickening. Vascular/Lymphatic: Normal caliber abdominal aorta. Patent portal, splenic, hepatic and renal veins. No pathologically enlarged lymph nodes in the abdomen. Other: No abdominal ascites or focal fluid collection. Musculoskeletal: No aggressive appearing focal osseous lesions. IMPRESSION: 1. No liver masses. 2. Minimal diffuse hepatic steatosis. Small vague focus of fatty sparing in the subcapsular liver adjacent to the gallbladder fossa, which probably accounts for the previously noted sonographic finding. 3. Normal bile ducts status post cholecystectomy. No acute abnormality. Electronically Signed   By: Delbert Phenix M.D.   On: 03/22/2018 09:25    Assessment & Plan:   Derek was seen today for abdominal pain.  Diagnoses and all orders for this visit:  Migraine without aura and without status migrainosus, not intractable -     Ubrogepant (UBRELVY) 100 MG TABS; Take 1 tablet by mouth daily as needed.  Esophageal stricture- I have asked him to see GI to see if he needs to undergo another upper endoscopy to see if there is a stricture that needs to be dilated. -     Ambulatory referral to Gastroenterology -     esomeprazole (NEXIUM) 40 MG capsule; Take 1 capsule (40 mg total) by mouth daily.  Epigastric abdominal tenderness without rebound tenderness- Based on his symptoms, exam, and labs I do not see an acute abdominal process.  I am concerned that he has upper GI pathology so I have asked him to see GI to consider undergoing an EGD. -     Lipase; Future -     Amylase; Future -     Hepatic function panel; Future -     CBC with Differential/Platelet; Future -     CBC with Differential/Platelet -  Hepatic function panel -     Amylase -     Lipase  Essential hypertension- His blood pressure is adequately well  controlled. 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  I recommended that he stop taking NSAIDs. -     esomeprazole (NEXIUM) 40 MG capsule; Take 1 capsule (40 mg total) by mouth daily.  Radiculitis of right cervical region- He has right cervical radiculitis with neurological symptoms and a decreased DTR in the right brachioradialis.  I recommended that he undergo an MRI to see if he indeed has a foraminal stenosis. -     MR Cervical Spine Wo Contrast; Future   I have discontinued Derek Tucker "Beau"'s SUMAtriptan and ALPRAZolam. I am also having him start on Ubrelvy and esomeprazole. Additionally, I am having him maintain his fish oil-omega-3 fatty acids, fluticasone, Spacer/Aero Chamber Mouthpiece, albuterol, famotidine, and Flovent HFA. We administered cyanocobalamin.  Meds ordered this encounter  Medications   Ubrogepant (UBRELVY) 100 MG TABS    Sig: Take 1 tablet by mouth daily as needed.    Dispense:  30 tablet    Refill:  1   cyanocobalamin ((VITAMIN B-12)) injection 1,000 mcg   esomeprazole (NEXIUM) 40 MG capsule    Sig: Take 1 capsule (40 mg total) by mouth daily.    Dispense:  90 capsule    Refill:  1   I spent 50 minutes in preparing to see the patient by review of recent labs, imaging and procedures, obtaining and reviewing separately obtained history, communicating with the patient and family or caregiver, ordering medications, tests or procedures, and documenting clinical information in the EHR including the differential Dx, treatment, and any further evaluation and other management of 1. Migraine without aura and  without status migrainosus, not intractable 2. Esophageal stricture 3. Epigastric abdominal tenderness without rebound tenderness 4. Essential hypertension 5. Vitamin B12 deficiency neuropathy ([place]) 6. Gastroesophageal reflux disease with esophagitis without hemorrhage 7. Radiculitis of right cervical region     Follow-up: Return in about 3 months (around 09/18/2020).  Scarlette Calico, MD

## 2020-06-27 ENCOUNTER — Telehealth: Payer: Self-pay | Admitting: Gastroenterology

## 2020-06-27 DIAGNOSIS — F332 Major depressive disorder, recurrent severe without psychotic features: Secondary | ICD-10-CM | POA: Diagnosis not present

## 2020-06-27 NOTE — Telephone Encounter (Signed)
Hey Dr Havery Moros, this pt is being referred to [place] from [facility] for Esophageal stricture but it looks like pt has seen Dr Benson [place] on 07/2019, pt would like to transfer his GI care to [place] since he is located closer. Please advise on scheduling, records are in epic for review.

## 2020-06-28 NOTE — Telephone Encounter (Signed)
I'd be happy to see him, can book in the office with me. Thanks

## 2020-07-04 ENCOUNTER — Telehealth: Payer: Self-pay | Admitting: Internal Medicine

## 2020-07-04 DIAGNOSIS — J4530 Mild persistent asthma, uncomplicated: Secondary | ICD-10-CM

## 2020-07-04 NOTE — Telephone Encounter (Signed)
° °  1.Medication Requested: albuterol (VENTOLIN HFA) 108 (90 Base) MCG/ACT inhaler  2. Pharmacy (Name, Street, [place]):[place], [place] - [address]  3. On Med List: yes  4. Last Visit with PCP: 06/20/20  5. Next visit date with PCP:   Agent: Please be advised that RX refills may take up to 3 business days. We ask that you follow-up with your pharmacy.

## 2020-07-05 ENCOUNTER — Other Ambulatory Visit: Payer: Self-pay | Admitting: Internal Medicine

## 2020-07-05 MED ORDER — ALBUTEROL SULFATE HFA 108 (90 BASE) MCG/ACT IN AERS: 2.0000 | INHALATION_SPRAY | Freq: Four times a day (QID) | RESPIRATORY_TRACT | 3 refills | Status: DC | PRN

## 2020-07-05 NOTE — Addendum Note (Signed)
Addended by: Karle Barr on: 07/05/2020 09:59 AM   Modules accepted: Orders

## 2020-07-15 ENCOUNTER — Ambulatory Visit
Admission: RE | Admit: 2020-07-15 | Discharge: 2020-07-15 | Disposition: A | Discharge status: Home / Self Care | Payer: 59 | Source: Ambulatory Visit | Attending: Internal Medicine | Admitting: Internal Medicine

## 2020-07-15 ENCOUNTER — Other Ambulatory Visit: Payer: Self-pay

## 2020-07-15 DIAGNOSIS — M5022 Other cervical disc displacement, mid-cervical region, unspecified level: Secondary | ICD-10-CM | POA: Diagnosis not present

## 2020-07-15 DIAGNOSIS — M5412 Radiculopathy, cervical region: Secondary | ICD-10-CM

## 2020-07-17 ENCOUNTER — Other Ambulatory Visit: Payer: Self-pay | Admitting: Internal Medicine

## 2020-07-17 DIAGNOSIS — M4802 Spinal stenosis, cervical region: Secondary | ICD-10-CM

## 2020-07-19 DIAGNOSIS — F4310 Post-traumatic stress disorder, unspecified: Secondary | ICD-10-CM | POA: Diagnosis not present

## 2020-07-19 DIAGNOSIS — F3181 Bipolar II disorder: Secondary | ICD-10-CM | POA: Diagnosis not present

## 2020-07-25 ENCOUNTER — Ambulatory Visit: Payer: 59

## 2020-07-25 ENCOUNTER — Other Ambulatory Visit: Payer: Self-pay

## 2020-07-25 DIAGNOSIS — F332 Major depressive disorder, recurrent severe without psychotic features: Secondary | ICD-10-CM | POA: Diagnosis not present

## 2020-07-26 ENCOUNTER — Ambulatory Visit: Payer: 59

## 2020-07-31 ENCOUNTER — Telehealth: Payer: Self-pay | Admitting: Neurology

## 2020-07-31 ENCOUNTER — Other Ambulatory Visit: Payer: Self-pay | Admitting: Neurology

## 2020-07-31 MED ORDER — GABAPENTIN 100 MG PO CAPS: 200.0000 mg | ORAL_CAPSULE | Freq: Every day | ORAL | 3 refills | Status: DC

## 2020-07-31 NOTE — Telephone Encounter (Signed)
Patient complains of frequent muscle fasciculation, occasionally right shoulder, right shoulder blade area deep muscle achy pain,  Normal motor examination, in specific, no evidence of muscle atrophy, no weakness, I was not able to see any fasciculation on today's examination, no sensory loss, within normal range deep tendon reflex, no pathological hyperreflexia, he does have right anterior shoulder tenderness upon deep palpitation  Bilateral upper and lower extremity sensory and motor nerve conduction study was within normal limit  Selected needle examination of right upper, lower extremity muscles, thoracic T9, T10 was normal  Gabapentin 100 mg 2 tablets every night as needed for fasciculation  Reported normal laboratory evaluation from primary care physician

## 2020-08-01 DIAGNOSIS — M542 Cervicalgia: Secondary | ICD-10-CM | POA: Diagnosis not present

## 2020-08-01 DIAGNOSIS — M5412 Radiculopathy, cervical region: Secondary | ICD-10-CM | POA: Diagnosis not present

## 2020-08-06 ENCOUNTER — Other Ambulatory Visit: Payer: Self-pay | Admitting: Internal Medicine

## 2020-08-06 DIAGNOSIS — K222 Esophageal obstruction: Secondary | ICD-10-CM

## 2020-08-08 DIAGNOSIS — F332 Major depressive disorder, recurrent severe without psychotic features: Secondary | ICD-10-CM | POA: Diagnosis not present

## 2020-08-09 DIAGNOSIS — F4310 Post-traumatic stress disorder, unspecified: Secondary | ICD-10-CM | POA: Diagnosis not present

## 2020-08-09 DIAGNOSIS — F3181 Bipolar II disorder: Secondary | ICD-10-CM | POA: Diagnosis not present

## 2020-08-22 DIAGNOSIS — F332 Major depressive disorder, recurrent severe without psychotic features: Secondary | ICD-10-CM | POA: Diagnosis not present

## 2020-08-28 DIAGNOSIS — F332 Major depressive disorder, recurrent severe without psychotic features: Secondary | ICD-10-CM | POA: Diagnosis not present

## 2020-09-04 ENCOUNTER — Other Ambulatory Visit (HOSPITAL_BASED_OUTPATIENT_CLINIC_OR_DEPARTMENT_OTHER): Payer: Self-pay

## 2020-09-09 DIAGNOSIS — F332 Major depressive disorder, recurrent severe without psychotic features: Secondary | ICD-10-CM | POA: Diagnosis not present

## 2020-09-23 DIAGNOSIS — F332 Major depressive disorder, recurrent severe without psychotic features: Secondary | ICD-10-CM | POA: Diagnosis not present

## 2020-09-26 ENCOUNTER — Other Ambulatory Visit: Payer: Self-pay | Admitting: Internal Medicine

## 2020-09-26 ENCOUNTER — Other Ambulatory Visit (HOSPITAL_COMMUNITY): Payer: Self-pay

## 2020-09-26 DIAGNOSIS — J4530 Mild persistent asthma, uncomplicated: Secondary | ICD-10-CM

## 2020-09-26 MED ORDER — FLOVENT HFA 110 MCG/ACT IN AERO
1.0000 | INHALATION_SPRAY | Freq: Two times a day (BID) | RESPIRATORY_TRACT | 5 refills | Status: DC | PRN
  Filled 2020-09-26: qty 12, 30d supply, fill #0

## 2020-09-26 MED FILL — Albuterol Sulfate Inhal Aero 108 MCG/ACT (90MCG Base Equiv): RESPIRATORY_TRACT | 25 days supply | Qty: 18 | Fill #0 | Status: AC

## 2020-09-27 DIAGNOSIS — F4310 Post-traumatic stress disorder, unspecified: Secondary | ICD-10-CM | POA: Diagnosis not present

## 2020-09-27 DIAGNOSIS — F3181 Bipolar II disorder: Secondary | ICD-10-CM | POA: Diagnosis not present

## 2020-10-07 DIAGNOSIS — F332 Major depressive disorder, recurrent severe without psychotic features: Secondary | ICD-10-CM | POA: Diagnosis not present

## 2021-01-02 ENCOUNTER — Ambulatory Visit: Payer: 59 | Admitting: Internal Medicine

## 2021-01-14 ENCOUNTER — Telehealth: Payer: Self-pay | Admitting: Internal Medicine

## 2021-01-14 NOTE — Telephone Encounter (Signed)
   Patient calling to report he fell off scaffold about 5 days ago, still having pain rib area.   Can xray be ordered?  Please advise

## 2021-01-15 ENCOUNTER — Ambulatory Visit (INDEPENDENT_AMBULATORY_CARE_PROVIDER_SITE_OTHER): Payer: 59 | Admitting: Internal Medicine

## 2021-01-15 ENCOUNTER — Other Ambulatory Visit: Payer: Self-pay

## 2021-01-15 ENCOUNTER — Encounter: Payer: Self-pay | Admitting: Internal Medicine

## 2021-01-15 ENCOUNTER — Ambulatory Visit (INDEPENDENT_AMBULATORY_CARE_PROVIDER_SITE_OTHER): Payer: 59

## 2021-01-15 VITALS — BP 126/82 | HR 90 | Temp 98.4°F | Resp 16 | Ht 71.0 in | Wt 192.0 lb

## 2021-01-15 DIAGNOSIS — S299XXA Unspecified injury of thorax, initial encounter: Secondary | ICD-10-CM | POA: Diagnosis not present

## 2021-01-15 DIAGNOSIS — R0789 Other chest pain: Secondary | ICD-10-CM | POA: Diagnosis not present

## 2021-01-15 DIAGNOSIS — G63 Polyneuropathy in diseases classified elsewhere: Secondary | ICD-10-CM

## 2021-01-15 DIAGNOSIS — I10 Essential (primary) hypertension: Secondary | ICD-10-CM

## 2021-01-15 DIAGNOSIS — E538 Deficiency of other specified B group vitamins: Secondary | ICD-10-CM

## 2021-01-15 MED ORDER — CYANOCOBALAMIN 1000 MCG/ML IJ SOLN
1000.0000 ug | Freq: Once | INTRAMUSCULAR | Status: AC
  Administered 2021-01-15: 1000 ug via INTRAMUSCULAR

## 2021-01-15 MED ORDER — HYDROCODONE-ACETAMINOPHEN 5-325 MG PO TABS: 1.0000 | ORAL_TABLET | Freq: Four times a day (QID) | ORAL | 0 refills | Status: AC | PRN

## 2021-01-15 NOTE — Progress Notes (Signed)
Subjective:  Patient ID: Derek Tucker, male    DOB: Nov 20, 1970  Age: 50 y.o. MRN: [id number]  CC: Chest Injury  This visit occurred during the SARS-CoV-2 public health emergency.  Safety protocols were in place, including screening questions prior to the visit, additional usage of staff PPE, and extensive cleaning of exam room while observing appropriate contact time as indicated for disinfecting solutions.    HPI Derek Tucker presents for f/up -  He complains that he injured his left anterior chest wall when he fell on some scaffolding about 5 days ago.  He has tried to control the pain with Tylenol and ibuprofen but still has discomfort with coughing and movement.  He denies bruising, swelling, chest pain, shortness of breath, hemoptysis, neck pain, or back pain.  Outpatient Medications Prior to Visit  Medication Sig Dispense Refill   albuterol (VENTOLIN HFA) 108 (90 Base) MCG/ACT inhaler INHALE 2 PUFFS BY MOUTH INTO THE LUNGS EVERY 4 HOURS AS NEEDED FOR COUGH OR WHEEZING 18 g 0   esomeprazole (NEXIUM) 40 MG capsule Take 1 capsule (40 mg total) by mouth daily. 90 capsule 1   fish oil-omega-3 fatty acids 1000 MG capsule Take 2 g by mouth daily.     fluticasone (FLONASE) 50 MCG/ACT nasal spray Place 4 sprays into the nose daily. 16 g 11   fluticasone (FLOVENT HFA) 110 MCG/ACT inhaler Inhale 1 puff into the lungs 2 times daily as needed. 12 g 5   Spacer/Aero Chamber Mouthpiece MISC 1 Act by Does not apply route 2 (two) times daily. 1 each 11   Ubrogepant (UBRELVY) 100 MG TABS Take 1 tablet by mouth daily as needed. 30 tablet 1   albuterol (VENTOLIN HFA) 108 (90 Base) MCG/ACT inhaler INHALE 2 PUFFS INTO THE LUNGS EVERY 6 (SIX) HOURS AS NEEDED FOR WHEEZING OR SHORTNESS OF BREATH. 18 g 3   famotidine (PEPCID) 20 MG tablet Take 20 mg by mouth 3 (three) times daily as needed.     gabapentin (NEURONTIN) 100 MG capsule TAKE 2 CAPSULES BY MOUTH AT BEDTIME 60 capsule 3   No  facility-administered medications prior to visit.    ROS Review of Systems  Constitutional:  Negative for diaphoresis and fatigue.  HENT: Negative.  Negative for trouble swallowing.   Eyes: Negative.   Respiratory:  Negative for cough, chest tightness, shortness of breath and wheezing.   Cardiovascular:  Positive for chest pain. Negative for palpitations and leg swelling.  Gastrointestinal:  Negative for abdominal pain, constipation, diarrhea and nausea.  Endocrine: Negative.   Genitourinary: Negative.  Negative for difficulty urinating, flank pain and hematuria.  Musculoskeletal:  Negative for arthralgias, back pain and neck pain.  Skin: Negative.   Neurological: Negative.  Negative for dizziness, weakness, light-headedness, numbness and headaches.  Hematological:  Negative for adenopathy. Does not bruise/bleed easily.  Psychiatric/Behavioral: Negative.     Objective:  BP 126/82 (BP Location: Left Arm, Patient Position: Sitting, Cuff Size: Large)   Pulse 90   Temp 98.4 F (36.9 C) (Oral)   Resp 16   Ht '5\' 11"'$  (1.803 m)   Wt 192 lb (87.1 kg)   SpO2 98%   BMI 26.78 kg/m   BP Readings from Last 3 Encounters:  01/15/21 126/82  06/20/20 126/82  11/27/19 134/82    Wt Readings from Last 3 Encounters:  01/15/21 192 lb (87.1 kg)  06/20/20 189 lb (85.7 kg)  11/27/19 192 lb 6 oz (87.3 kg)    Physical Exam Vitals reviewed.  Constitutional:      General: He is not in acute distress.    Appearance: Normal appearance. He is not ill-appearing, toxic-appearing or diaphoretic.  HENT:     Nose: Nose normal.     Mouth/Throat:     Mouth: Mucous membranes are moist.  Eyes:     Conjunctiva/sclera: Conjunctivae normal.  Cardiovascular:     Rate and Rhythm: Normal rate and regular rhythm.     Heart sounds: No murmur heard.   No friction rub.  Pulmonary:     Effort: Pulmonary effort is normal.     Breath sounds: No stridor. No wheezing, rhonchi or rales.  Chest:     Chest wall:  Tenderness present. No mass, deformity, swelling or edema.    Abdominal:     General: Abdomen is flat.     Tenderness: There is no abdominal tenderness. There is no guarding.     Hernia: No hernia is present.  Musculoskeletal:        General: No tenderness. Normal range of motion.     Cervical back: Neck supple.  Lymphadenopathy:     Cervical: No cervical adenopathy.  Skin:    General: Skin is warm and dry.     Findings: No bruising.  Neurological:     General: No focal deficit present.     Mental Status: He is alert.    Lab Results  Component Value Date   WBC 8.0 06/20/2020   HGB 15.5 06/20/2020   HCT 44.1 06/20/2020   PLT 342.0 06/20/2020   GLUCOSE 95 06/20/2020   CHOL 136 11/29/2019   TRIG 155.0 (H) 11/29/2019   HDL 40.50 11/29/2019   LDLDIRECT 85.0 01/19/2017   LDLCALC 64 11/29/2019   ALT 27 06/20/2020   AST 24 06/20/2020   NA 139 06/20/2020   K 3.7 06/20/2020   CL 103 06/20/2020   CREATININE 1.16 06/20/2020   BUN 9 06/20/2020   CO2 29 06/20/2020   TSH 2.89 11/29/2019   PSA 0.28 11/29/2019    MR Cervical Spine Wo Contrast  Result Date: 07/15/2020 CLINICAL DATA:  Cervical radiculopathy, infection suspected. Pain and numbness in right arm for several years. EXAM: MRI CERVICAL SPINE WITHOUT CONTRAST TECHNIQUE: Multiplanar, multisequence MR imaging of the cervical spine was performed. No intravenous contrast was administered. COMPARISON:  Report of MRI cervical spine 02/06/2008 at [facility]. Images are not available. FINDINGS: Alignment: No significant listhesis is present. Cervical lordosis is preserved. Vertebrae: Marrow signal and vertebral body heights are normal. Cord: Normal signal and morphology. Posterior Fossa, vertebral arteries, paraspinal tissues: Craniocervical junction is normal. Flow is present in the vertebral arteries bilaterally. Visualized intracranial contents are normal. Disc levels: C2-3: Negative C3-4: Shallow central disc protrusion is present. There is  partial effacement ventral CSF. Foramina are patent bilaterally. C4-5: A rightward disc osteophyte complex is present. Partial effacement ventral CSF is noted on right. Moderate foraminal narrowing is present on right. Mild left foraminal narrowing is present. C5-6: Asymmetric right-sided facet hypertrophy is present. Mild uncovertebral spurring is present bilaterally. Mild foraminal narrowing is worse right than left. C6-7: Asymmetric right-sided facet hypertrophy is present. Mild uncovertebral spurring is noted bilaterally. No significant stenosis is present. C7-T1: Mild facet hypertrophy is present bilaterally. No significant disc protrusion or stenosis is present. IMPRESSION: 1. Moderate right foraminal stenosis at C4-5 secondary to a rightward disc osteophyte complex and uncovertebral spurring. 2. Mild foraminal narrowing bilaterally at C5-6 is worse right than left. 3. Asymmetric right-sided facet hypertrophy and mild uncovertebral spurring  bilaterally at C6-7 without significant stenosis. 4. Shallow central disc protrusion at C3-4 without significant stenosis. 5. Mild facet hypertrophy bilaterally at C7-T1 without significant stenosis. Electronically Signed   By: San Morelle M.D.   On: 07/15/2020 08:38    DG Ribs Unilateral Left  Result Date: 01/15/2021 CLINICAL DATA:  Left rib trauma 6 days ago, anterior pain EXAM: LEFT RIBS - 2 VIEW COMPARISON:  01/14/2018 FINDINGS: Frontal and oblique views of the left thoracic cage are obtained. No acute displaced fractures. Left chest is clear. No effusion or pneumothorax. IMPRESSION: 1. No acute left rib fractures. Electronically Signed   By: Randa Ngo M.D.   On: 01/15/2021 16:39   DG ABD ACUTE 2+V W 1V CHEST  Result Date: 01/15/2021 CLINICAL DATA:  Right upper quadrant pain and discomfort, previous tobacco abuse, trauma 5 days ago EXAM: DG ABDOMEN ACUTE WITH 1 VIEW CHEST COMPARISON:  None. FINDINGS: Supine and upright frontal views of the abdomen  and pelvis are obtained as well as an upright frontal view of the chest. Cardiac silhouette is unremarkable. No airspace disease, effusion, or pneumothorax. Bowel gas pattern is unremarkable. No free gas within the greater peritoneal sac. No masses or abnormal calcifications. No acute bony abnormalities. IMPRESSION: 1. Unremarkable abdominal series. Electronically Signed   By: Randa Ngo M.D.   On: 01/15/2021 16:37     Assessment & Plan:   Derek was seen today for chest injury.  Diagnoses and all orders for this visit:  Low-energy blunt traumatic injury of chest -     DG ABD ACUTE 2+V W 1V CHEST; Future -     DG Ribs Unilateral Left; Future -     HYDROcodone-acetaminophen (NORCO/VICODIN) 5-325 MG tablet; Take 1 tablet by mouth every 6 (six) hours as needed for up to 5 days for moderate pain.  Left-sided chest wall pain- His exam and x-rays are reassuring.  His symptoms are consistent with a chest wall contusion.  Will add hydrocodone for symptom relief. -     DG ABD ACUTE 2+V W 1V CHEST; Future -     DG Ribs Unilateral Left; Future -     HYDROcodone-acetaminophen ([place]/VICODIN) 5-325 MG tablet; Take 1 tablet by mouth every 6 (six) hours as needed for up to 5 days for moderate pain.  Essential hypertension- His blood pressure is well controlled.  Vitamin B12 deficiency neuropathy (HCC) -     cyanocobalamin ((VITAMIN B-12)) injection 1,000 mcg  I have discontinued Derek Tucker "Beau"'s famotidine and gabapentin. I am also having him start on HYDROcodone-acetaminophen. Additionally, I am having him maintain his fish oil-omega-3 fatty acids, fluticasone, Spacer/Aero Chamber Mouthpiece, Ubrelvy, esomeprazole, albuterol, and Flovent HFA. We administered cyanocobalamin.  Meds ordered this encounter  Medications   cyanocobalamin ((VITAMIN B-12)) injection 1,000 mcg   HYDROcodone-acetaminophen ([place]/VICODIN) 5-325 MG tablet    Sig: Take 1 tablet by mouth every 6 (six) hours  as needed for up to 5 days for moderate pain.    Dispense:  15 tablet    Refill:  0      Follow-up: Return in about 3 weeks (around 02/05/2021).  Scarlette Calico, MD

## 2021-01-15 NOTE — Patient Instructions (Signed)
Chest Wall Pain Chest wall pain is pain in or around the bones and muscles of your chest. Sometimes, an injury causes this pain. Excessive coughing or overuse of arm and chest muscles may also cause chest wall pain. Sometimes, the cause may not beknown. This pain may take several weeks or longer to get better. Follow these instructions at home: Managing pain, stiffness, and swelling  If directed, put ice on the painful area: Put ice in a plastic bag. Place a towel between your skin and the bag. Leave the ice on for 20 minutes, 2-3 times per day.  Activity Rest as told by your health care provider. Avoid activities that cause pain. These include any activities that use your chest muscles or your abdominal and side muscles to lift heavy items. Ask your health care provider what activities are safe for you. General instructions  Take over-the-counter and prescription medicines only as told by your health care provider. Do not use any products that contain nicotine or tobacco, such as cigarettes, e-cigarettes, and chewing tobacco. These can delay healing after injury. If you need help quitting, ask your health care provider. Keep all follow-up visits as told by your health care provider. This is important.  Contact a health care provider if: You have a fever. Your chest pain becomes worse. You have new symptoms. Get help right away if: You have nausea or vomiting. You feel sweaty or light-headed. You have a cough with mucus from your lungs (sputum) or you cough up blood. You develop shortness of breath. These symptoms may represent a serious problem that is an emergency. Do not wait to see if the symptoms will go away. Get medical help right away. Call your local emergency services (911 in the [place] Do not drive yourself to the hospital. Summary Chest wall pain is pain in or around the bones and muscles of your chest. Depending on the cause, it may be treated with ice, rest, medicines,  and avoiding activities that cause pain. Contact a health care provider if you have a fever, worsening chest pain, or new symptoms. Get help right away if you feel light-headed or you develop shortness of breath. These symptoms may be an emergency. This information is not intended to replace advice given to you by your health care provider. Make sure you discuss any questions you have with your healthcare provider. Document Revised: 12/02/2017 Document Reviewed: 12/02/2017 Elsevier Patient Education  2022 Elsevier Inc.  

## 2021-01-28 ENCOUNTER — Other Ambulatory Visit: Payer: Self-pay

## 2021-01-28 ENCOUNTER — Ambulatory Visit (INDEPENDENT_AMBULATORY_CARE_PROVIDER_SITE_OTHER): Payer: 59 | Admitting: Internal Medicine

## 2021-01-28 ENCOUNTER — Encounter: Payer: Self-pay | Admitting: Internal Medicine

## 2021-01-28 VITALS — BP 126/84 | HR 80 | Temp 98.6°F | Ht 71.0 in | Wt 194.0 lb

## 2021-01-28 DIAGNOSIS — F331 Major depressive disorder, recurrent, moderate: Secondary | ICD-10-CM | POA: Diagnosis not present

## 2021-01-28 DIAGNOSIS — I10 Essential (primary) hypertension: Secondary | ICD-10-CM | POA: Diagnosis not present

## 2021-01-28 DIAGNOSIS — Z0000 Encounter for general adult medical examination without abnormal findings: Secondary | ICD-10-CM | POA: Diagnosis not present

## 2021-01-28 DIAGNOSIS — J4530 Mild persistent asthma, uncomplicated: Secondary | ICD-10-CM | POA: Diagnosis not present

## 2021-01-28 NOTE — Progress Notes (Addendum)
Subjective:  Patient ID: Derek Tucker, male    DOB: 04-Mar-1971  Age: 50 y.o. MRN: [id number]  CC: Annual Exam and Hypertension  This visit occurred during the SARS-CoV-2 public health emergency.  Safety protocols were in place, including screening questions prior to the visit, additional usage of staff PPE, and extensive cleaning of exam room while observing appropriate contact time as indicated for disinfecting solutions.    HPI Derek Tucker presents for a CPX and f/up -  His chest pain is much better and is no longer taking anything for pain.  He is active and denies chest pain, shortness of breath, diaphoresis, dizziness, lightheadedness, edema, or fatigue.  Outpatient Medications Prior to Visit  Medication Sig Dispense Refill   Esketamine HCl (SPRAVATO, 56 MG DOSE, NA) Place into the nose. Take 1 spray every 2 weeks. Administered in office.     esomeprazole (NEXIUM) 40 MG capsule Take 1 capsule (40 mg total) by mouth daily. 90 capsule 1   fish oil-omega-3 fatty acids 1000 MG capsule Take 2 g by mouth daily.     fluticasone (FLONASE) 50 MCG/ACT nasal spray Place 4 sprays into the nose daily. 16 g 11   fluticasone (FLOVENT HFA) 110 MCG/ACT inhaler Inhale 1 puff into the lungs 2 times daily as needed. 12 g 5   pramipexole (MIRAPEX) 1 MG tablet Take 1 mg by mouth at bedtime.     Spacer/Aero Chamber Mouthpiece MISC 1 Act by Does not apply route 2 (two) times daily. 1 each 11   Ubrogepant (UBRELVY) 100 MG TABS Take 1 tablet by mouth daily as needed. 30 tablet 1   albuterol (VENTOLIN HFA) 108 (90 Base) MCG/ACT inhaler INHALE 2 PUFFS BY MOUTH INTO THE LUNGS EVERY 4 HOURS AS NEEDED FOR COUGH OR WHEEZING 18 g 0   No facility-administered medications prior to visit.    ROS Review of Systems  Constitutional:  Negative for chills, diaphoresis, fatigue and fever.  HENT: Negative.    Eyes: Negative.   Respiratory:  Negative for chest tightness, shortness of breath and wheezing.    Cardiovascular:  Positive for chest pain. Negative for palpitations and leg swelling.  Gastrointestinal:  Negative for abdominal pain, constipation, diarrhea, nausea and vomiting.  Endocrine: Negative.   Genitourinary: Negative.  Negative for difficulty urinating, scrotal swelling, testicular pain and urgency.  Musculoskeletal: Negative.   Skin: Negative.   Neurological: Negative.  Negative for dizziness, weakness, light-headedness and headaches.  Hematological:  Negative for adenopathy. Does not bruise/bleed easily.  Psychiatric/Behavioral: Negative.     Objective:  BP 126/84 (BP Location: Right Arm, Patient Position: Sitting, Cuff Size: Large)   Pulse 80   Temp 98.6 F (37 C) (Oral)   Ht '5\' 11"'$  (1.803 m)   Wt 194 lb (88 kg)   SpO2 96%   BMI 27.06 kg/m   BP Readings from Last 3 Encounters:  01/28/21 126/84  01/15/21 126/82  06/20/20 126/82    Wt Readings from Last 3 Encounters:  01/28/21 194 lb (88 kg)  01/15/21 192 lb (87.1 kg)  06/20/20 189 lb (85.7 kg)    Physical Exam Vitals reviewed.  Constitutional:      Appearance: Normal appearance.  HENT:     Nose: Nose normal.     Mouth/Throat:     Mouth: Mucous membranes are moist.  Eyes:     Conjunctiva/sclera: Conjunctivae normal.  Cardiovascular:     Rate and Rhythm: Normal rate and regular rhythm.     Heart sounds:  No murmur heard. Pulmonary:     Effort: Pulmonary effort is normal.     Breath sounds: No stridor. No wheezing, rhonchi or rales.  Chest:     Chest wall: No mass, swelling or tenderness.  Abdominal:     General: Abdomen is flat.     Palpations: There is no mass.     Tenderness: There is no abdominal tenderness. There is no guarding.     Hernia: No hernia is present.  Genitourinary:    Comments: GU/rectal exam deferred at his request Musculoskeletal:        General: Normal range of motion.     Cervical back: Neck supple.     Right lower leg: No edema.     Left lower leg: No edema.   Lymphadenopathy:     Cervical: No cervical adenopathy.  Skin:    General: Skin is warm and dry.     Findings: No rash.  Neurological:     General: No focal deficit present.     Mental Status: He is alert.  Psychiatric:        Mood and Affect: Mood normal.        Behavior: Behavior normal.    Lab Results  Component Value Date   WBC 8.0 02/14/2021   HGB 14.9 02/14/2021   HCT 43.8 02/14/2021   PLT 333.0 02/14/2021   GLUCOSE 91 02/14/2021   CHOL 127 02/14/2021   TRIG 209.0 (H) 02/14/2021   HDL 38.60 (L) 02/14/2021   LDLDIRECT 71.0 02/14/2021   LDLCALC 64 11/29/2019   ALT 21 02/14/2021   AST 22 02/14/2021   NA 137 02/14/2021   K 4.0 02/14/2021   CL 100 02/14/2021   CREATININE 0.91 02/14/2021   BUN 8 02/14/2021   CO2 29 02/14/2021   TSH 2.46 02/14/2021   PSA 0.52 02/14/2021    MR Cervical Spine Wo Contrast  Result Date: 07/15/2020 CLINICAL DATA:  Cervical radiculopathy, infection suspected. Pain and numbness in right arm for several years. EXAM: MRI CERVICAL SPINE WITHOUT CONTRAST TECHNIQUE: Multiplanar, multisequence MR imaging of the cervical spine was performed. No intravenous contrast was administered. COMPARISON:  Report of MRI cervical spine 02/06/2008 at [facility]. Images are not available. FINDINGS: Alignment: No significant listhesis is present. Cervical lordosis is preserved. Vertebrae: Marrow signal and vertebral body heights are normal. Cord: Normal signal and morphology. Posterior Fossa, vertebral arteries, paraspinal tissues: Craniocervical junction is normal. Flow is present in the vertebral arteries bilaterally. Visualized intracranial contents are normal. Disc levels: C2-3: Negative C3-4: Shallow central disc protrusion is present. There is partial effacement ventral CSF. Foramina are patent bilaterally. C4-5: A rightward disc osteophyte complex is present. Partial effacement ventral CSF is noted on right. Moderate foraminal narrowing is present on right. Mild left  foraminal narrowing is present. C5-6: Asymmetric right-sided facet hypertrophy is present. Mild uncovertebral spurring is present bilaterally. Mild foraminal narrowing is worse right than left. C6-7: Asymmetric right-sided facet hypertrophy is present. Mild uncovertebral spurring is noted bilaterally. No significant stenosis is present. C7-T1: Mild facet hypertrophy is present bilaterally. No significant disc protrusion or stenosis is present. IMPRESSION: 1. Moderate right foraminal stenosis at C4-5 secondary to a rightward disc osteophyte complex and uncovertebral spurring. 2. Mild foraminal narrowing bilaterally at C5-6 is worse right than left. 3. Asymmetric right-sided facet hypertrophy and mild uncovertebral spurring bilaterally at C6-7 without significant stenosis. 4. Shallow central disc protrusion at C3-4 without significant stenosis. 5. Mild facet hypertrophy bilaterally at C7-T1 without significant stenosis. Electronically Signed  By: San Morelle M.D.   On: 07/15/2020 08:38    Assessment & Plan:   Derek was seen today for annual exam and hypertension.  Diagnoses and all orders for this visit:  Essential hypertension- His blood pressure is adequately well controlled. -     CBC with Differential/Platelet; Future -     Basic metabolic panel; Future -     Hepatic function panel; Future -     TSH; Future -     Urinalysis, Routine w reflex microscopic; Future  Mild persistent asthma, unspecified whether complicated- His symptoms are well controlled.  Will continue the ICS. -     CBC with Differential/Platelet; Future -     Hepatic function panel; Future  Moderate episode of recurrent major depressive disorder ([place])- He tells me he is doing well on the current regimen. -     TSH; Future  Routine general medical examination at a health care facility- Exam completed, labs reviewed, vaccines reviewed and updated, cancer screenings addressed, patient education was given. -      Lipid panel; Future -     PSA; Future -     HIV Antibody (routine testing w rflx); Future  I am having Derek Tucker "Beau" maintain his fish oil-omega-3 fatty acids, fluticasone, Spacer/Aero Chamber Mouthpiece, Ubrelvy, esomeprazole, Flovent HFA, (Esketamine HCl (SPRAVATO, 56 MG DOSE, NA)), and pramipexole.  No orders of the defined types were placed in this encounter.    Follow-up: Return in about 6 months (around 07/31/2021).  Scarlette Calico, MD

## 2021-01-28 NOTE — Patient Instructions (Signed)
Health Maintenance, Male Adopting a healthy lifestyle and getting preventive care are important in promoting health and wellness. Ask your health care provider about: The right schedule for you to have regular tests and exams. Things you can do on your own to prevent diseases and keep yourself healthy. What should I know about diet, weight, and exercise? Eat a healthy diet  Eat a diet that includes plenty of vegetables, fruits, low-fat dairy products, and lean protein. Do not eat a lot of foods that are high in solid fats, added sugars, or sodium.  Maintain a healthy weight Body mass index (BMI) is a measurement that can be used to identify possible weight problems. It estimates body fat based on height and weight. Your health care provider can help determine your BMI and help you achieve or maintain ahealthy weight. Get regular exercise Get regular exercise. This is one of the most important things you can do for your health. Most adults should: Exercise for at least 150 minutes each week. The exercise should increase your heart rate and make you sweat (moderate-intensity exercise). Do strengthening exercises at least twice a week. This is in addition to the moderate-intensity exercise. Spend less time sitting. Even light physical activity can be beneficial. Watch cholesterol and blood lipids Have your blood tested for lipids and cholesterol at 52 years of age, then havethis test every 5 years. You may need to have your cholesterol levels checked more often if: Your lipid or cholesterol levels are high. You are older than 52 years of age. You are at high risk for heart disease. What should I know about cancer screening? Many types of cancers can be detected early and may often be prevented. Depending on your health history and family history, you may need to have cancer screening at various ages. This may include screening for: Colorectal cancer. Prostate cancer. Skin cancer. Lung  cancer. What should I know about heart disease, diabetes, and high blood pressure? Blood pressure and heart disease High blood pressure causes heart disease and increases the risk of stroke. This is more likely to develop in people who have high blood pressure readings, are of African descent, or are overweight. Talk with your health care provider about your target blood pressure readings. Have your blood pressure checked: Every 3-5 years if you are 18-39 years of age. Every year if you are 40 years old or older. If you are between the ages of 65 and 75 and are a current or former smoker, ask your health care provider if you should have a one-time screening for abdominal aortic aneurysm (AAA). Diabetes Have regular diabetes screenings. This checks your fasting blood sugar level. Have the screening done: Once every three years after age 45 if you are at a normal weight and have a low risk for diabetes. More often and at a younger age if you are overweight or have a high risk for diabetes. What should I know about preventing infection? Hepatitis B If you have a higher risk for hepatitis B, you should be screened for this virus. Talk with your health care provider to find out if you are at risk forhepatitis B infection. Hepatitis C Blood testing is recommended for: Everyone born from 1945 through 1965. Anyone with known risk factors for hepatitis C. Sexually transmitted infections (STIs) You should be screened each year for STIs, including gonorrhea and chlamydia, if: You are sexually active and are younger than 52 years of age. You are older than 52 years of age   and your health care provider tells you that you are at risk for this type of infection. Your sexual activity has changed since you were last screened, and you are at increased risk for chlamydia or gonorrhea. Ask your health care provider if you are at risk. Ask your health care provider about whether you are at high risk for HIV.  Your health care provider may recommend a prescription medicine to help prevent HIV infection. If you choose to take medicine to prevent HIV, you should first get tested for HIV. You should then be tested every 3 months for as long as you are taking the medicine. Follow these instructions at home: Lifestyle Do not use any products that contain nicotine or tobacco, such as cigarettes, e-cigarettes, and chewing tobacco. If you need help quitting, ask your health care provider. Do not use street drugs. Do not share needles. Ask your health care provider for help if you need support or information about quitting drugs. Alcohol use Do not drink alcohol if your health care provider tells you not to drink. If you drink alcohol: Limit how much you have to 0-2 drinks a day. Be aware of how much alcohol is in your drink. In the [place] one drink equals one 12 oz bottle of beer (355 mL), one 5 oz glass of wine (148 mL), or one 1 oz glass of hard liquor (44 mL). General instructions Schedule regular health, dental, and eye exams. Stay current with your vaccines. Tell your health care provider if: You often feel depressed. You have ever been abused or do not feel safe at home. Summary Adopting a healthy lifestyle and getting preventive care are important in promoting health and wellness. Follow your health care provider's instructions about healthy diet, exercising, and getting tested or screened for diseases. Follow your health care provider's instructions on monitoring your cholesterol and blood pressure. This information is not intended to replace advice given to you by your health care provider. Make sure you discuss any questions you have with your healthcare provider. Document Revised: 05/25/2018 Document Reviewed: 05/25/2018 Elsevier Patient Education  2022 Elsevier Inc.  

## 2021-02-05 ENCOUNTER — Encounter: Payer: Self-pay | Admitting: Family Medicine

## 2021-02-05 ENCOUNTER — Telehealth (INDEPENDENT_AMBULATORY_CARE_PROVIDER_SITE_OTHER): Payer: 59 | Admitting: Family Medicine

## 2021-02-05 DIAGNOSIS — J029 Acute pharyngitis, unspecified: Secondary | ICD-10-CM | POA: Diagnosis not present

## 2021-02-05 MED ORDER — AMOXICILLIN-POT CLAVULANATE 500-125 MG PO TABS: 1.0000 | ORAL_TABLET | Freq: Two times a day (BID) | ORAL | 0 refills | Status: AC

## 2021-02-05 NOTE — Progress Notes (Signed)
Virtual Visit via Video Note  I connected with Derek Tucker on 02/05/21 at  3:00 PM EDT by a video enabled telemedicine application 2/2 [id number] pandemic and verified that I am speaking with the correct person using two identifiers.  Location patient: home Location provider:work or home office Persons participating in the virtual visit: patient, provider  I discussed the limitations of evaluation and management by telemedicine and the availability of in person appointments. The patient expressed understanding and agreed to proceed.   HPI: Pt with sore throat x 5 days.  Hard to swallow.  Had fatigue, fever 100.16F on the first day.  COVID test negative x last 5 days.  Pt notes tonsils are enlarged and streaky.  Endorses increased ear pressure.   Symptoms seem to improve then returned.  Denies cough, n/v, HA, or sick contacts.  Patient's partner has not been sick.  Tried Tylenol and ASA, gargled with salt water.   ROS: See pertinent positives and negatives per HPI.  Past Medical History:  Diagnosis Date   Allergy    Arthritis    Asthma     Past Surgical History:  Procedure Laterality Date   APPENDECTOMY  06/15/1980   KNEE ARTHROSCOPY  2012    Family History  Problem Relation Age of Onset   Arthritis Mother    Hypertension Mother    Depression Father    Heart disease Father    Hypertension Father    Asthma Neg Hx    Alcohol abuse Neg Hx    Cancer Neg Hx    COPD Neg Hx    Diabetes Neg Hx    Drug abuse Neg Hx    Hyperlipidemia Neg Hx    Kidney disease Neg Hx    Stroke Neg Hx    Depression Brother     Current Outpatient Medications:    albuterol (VENTOLIN HFA) 108 (90 Base) MCG/ACT inhaler, INHALE 2 PUFFS BY MOUTH INTO THE LUNGS EVERY 4 HOURS AS NEEDED FOR COUGH OR WHEEZING, Disp: 18 g, Rfl: 0   Esketamine HCl (SPRAVATO, 56 MG DOSE, NA), Place into the nose. Take 1 spray every 2 weeks. Administered in office., Disp: , Rfl:    esomeprazole (NEXIUM) 40 MG capsule, Take  1 capsule (40 mg total) by mouth daily., Disp: 90 capsule, Rfl: 1   fish oil-omega-3 fatty acids 1000 MG capsule, Take 2 g by mouth daily., Disp: , Rfl:    fluticasone (FLONASE) 50 MCG/ACT nasal spray, Place 4 sprays into the nose daily., Disp: 16 g, Rfl: 11   fluticasone (FLOVENT HFA) 110 MCG/ACT inhaler, Inhale 1 puff into the lungs 2 times daily as needed., Disp: 12 g, Rfl: 5   pramipexole (MIRAPEX) 1 MG tablet, Take 1 mg by mouth at bedtime., Disp: , Rfl:    Spacer/Aero Chamber Mouthpiece MISC, 1 Act by Does not apply route 2 (two) times daily., Disp: 1 each, Rfl: 11   Ubrogepant (UBRELVY) 100 MG TABS, Take 1 tablet by mouth daily as needed., Disp: 30 tablet, Rfl: 1  EXAM:  VITALS per patient if applicable: RR between 123456 bpm  GENERAL: alert, oriented, appears well and in no acute distress  HEENT: atraumatic, conjunctiva clear, no obvious abnormalities on inspection of external nose and ears.  Pharynx with erythema, tonsils enlarged 2+, difficult to appreciate exudate.  NECK: normal movements of the head and neck  LUNGS: on inspection no signs of respiratory distress, breathing rate appears normal, no obvious gross SOB, gasping or wheezing  CV: no obvious  cyanosis  MS: moves all visible extremities without noticeable abnormality  PSYCH/NEURO: pleasant and cooperative, no obvious depression or anxiety, speech and thought processing grossly intact  ASSESSMENT AND PLAN:  Discussed the following assessment and plan:  Pharyngitis, unspecified etiology -likely strep pharyngitis but unable to obtain swab or culture. -Continue supportive care including Tylenol, gargling with warm salt water or Chloraseptic spray, rest, hydration -We will start Augmentin -Given precautions -Allergies reviewed  - Plan: amoxicillin-clavulanate (AUGMENTIN) 500-125 MG tablet  Follow-up with PCP as needed for continued or worsening symptoms   I discussed the assessment and treatment plan with the  patient. The patient was provided an opportunity to ask questions and all were answered. The patient agreed with the plan and demonstrated an understanding of the instructions.   The patient was advised to call back or seek an in-person evaluation if the symptoms worsen or if the condition fails to improve as anticipated.  Billie Ruddy, MD

## 2021-02-14 ENCOUNTER — Other Ambulatory Visit: Payer: 59

## 2021-02-14 ENCOUNTER — Other Ambulatory Visit (INDEPENDENT_AMBULATORY_CARE_PROVIDER_SITE_OTHER): Payer: 59

## 2021-02-14 ENCOUNTER — Other Ambulatory Visit: Payer: Self-pay

## 2021-02-14 DIAGNOSIS — Z0000 Encounter for general adult medical examination without abnormal findings: Secondary | ICD-10-CM

## 2021-02-14 DIAGNOSIS — I10 Essential (primary) hypertension: Secondary | ICD-10-CM

## 2021-02-14 DIAGNOSIS — J4530 Mild persistent asthma, uncomplicated: Secondary | ICD-10-CM | POA: Diagnosis not present

## 2021-02-14 DIAGNOSIS — F331 Major depressive disorder, recurrent, moderate: Secondary | ICD-10-CM | POA: Diagnosis not present

## 2021-02-14 DIAGNOSIS — Z125 Encounter for screening for malignant neoplasm of prostate: Secondary | ICD-10-CM

## 2021-02-14 LAB — LIPID PANEL
Cholesterol: 127 mg/dL (ref 0–200)
HDL: 38.6 mg/dL — ABNORMAL LOW (ref >39.00)
NonHDL: 88.32
Total CHOL/HDL Ratio: 3
Triglycerides: 209 mg/dL — ABNORMAL HIGH (ref 0.0–149.0)
VLDL: 41.8 mg/dL — ABNORMAL HIGH (ref 0.0–40.0)

## 2021-02-14 LAB — BASIC METABOLIC PANEL
BUN: 8 mg/dL (ref 6–23)
CO2: 29 mEq/L (ref 19–32)
Calcium: 9.4 mg/dL (ref 8.4–10.5)
Chloride: 100 mEq/L (ref 96–112)
Creatinine, Ser: 0.91 mg/dL (ref 0.40–1.50)
GFR: 96.91 mL/min (ref >60.00)
Glucose, Bld: 91 mg/dL (ref 70–99)
Potassium: 4 mEq/L (ref 3.5–5.1)
Sodium: 137 mEq/L (ref 135–145)

## 2021-02-14 LAB — URINALYSIS, ROUTINE W REFLEX MICROSCOPIC
Bilirubin Urine: NEGATIVE
Ketones, ur: NEGATIVE
Leukocytes,Ua: NEGATIVE
Nitrite: NEGATIVE
Specific Gravity, Urine: <=1.005 — AB (ref 1.000–1.030)
Total Protein, Urine: NEGATIVE
Urine Glucose: NEGATIVE
Urobilinogen, UA: 0.2 (ref 0.0–1.0)
pH: 7 (ref 5.0–8.0)

## 2021-02-14 LAB — CBC WITH DIFFERENTIAL/PLATELET
Basophils Absolute: 0.1 10*3/uL (ref 0.0–0.1)
Basophils Relative: 0.9 % (ref 0.0–3.0)
Eosinophils Absolute: 0.3 10*3/uL (ref 0.0–0.7)
Eosinophils Relative: 4 % (ref 0.0–5.0)
HCT: 43.8 % (ref 39.0–52.0)
Hemoglobin: 14.9 g/dL (ref 13.0–17.0)
Lymphocytes Relative: 25.1 % (ref 12.0–46.0)
Lymphs Abs: 2 10*3/uL (ref 0.7–4.0)
MCHC: 34.1 g/dL (ref 30.0–36.0)
MCV: 87.7 fl (ref 78.0–100.0)
Monocytes Absolute: 0.7 10*3/uL (ref 0.1–1.0)
Monocytes Relative: 8.3 % (ref 3.0–12.0)
Neutro Abs: 4.9 10*3/uL (ref 1.4–7.7)
Neutrophils Relative %: 61.7 % (ref 43.0–77.0)
Platelets: 333 10*3/uL (ref 150.0–400.0)
RBC: 4.99 Mil/uL (ref 4.22–5.81)
RDW: 13.6 % (ref 11.5–15.5)
WBC: 8 10*3/uL (ref 4.0–10.5)

## 2021-02-14 LAB — HEPATIC FUNCTION PANEL
ALT: 21 U/L (ref 0–53)
AST: 22 U/L (ref 0–37)
Albumin: 4.5 g/dL (ref 3.5–5.2)
Alkaline Phosphatase: 52 U/L (ref 39–117)
Bilirubin, Direct: 0.2 mg/dL (ref 0.0–0.3)
Total Bilirubin: 1.3 mg/dL — ABNORMAL HIGH (ref 0.2–1.2)
Total Protein: 7.1 g/dL (ref 6.0–8.3)

## 2021-02-14 LAB — TSH: TSH: 2.46 u[IU]/mL (ref 0.35–5.50)

## 2021-02-14 LAB — LDL CHOLESTEROL, DIRECT: Direct LDL: 71 mg/dL

## 2021-02-14 LAB — PSA: PSA: 0.52 ng/mL (ref 0.10–4.00)

## 2021-02-17 LAB — HIV ANTIBODY (ROUTINE TESTING W REFLEX): HIV 1&2 Ab, 4th Generation: NONREACTIVE

## 2021-02-21 ENCOUNTER — Other Ambulatory Visit: Payer: Self-pay | Admitting: Internal Medicine

## 2021-06-05 DIAGNOSIS — F419 Anxiety disorder, unspecified: Secondary | ICD-10-CM | POA: Insufficient documentation

## 2021-06-30 ENCOUNTER — Other Ambulatory Visit: Payer: Self-pay | Admitting: Internal Medicine

## 2021-07-09 ENCOUNTER — Other Ambulatory Visit: Payer: Self-pay

## 2021-07-09 ENCOUNTER — Encounter: Payer: Self-pay | Admitting: Internal Medicine

## 2021-07-09 ENCOUNTER — Ambulatory Visit (INDEPENDENT_AMBULATORY_CARE_PROVIDER_SITE_OTHER): Payer: Managed Care, Other (non HMO) | Admitting: Internal Medicine

## 2021-07-09 VITALS — BP 128/84 | HR 80 | Temp 98.6°F | Ht 71.0 in | Wt 197.0 lb

## 2021-07-09 DIAGNOSIS — J351 Hypertrophy of tonsils: Secondary | ICD-10-CM | POA: Insufficient documentation

## 2021-07-09 DIAGNOSIS — E538 Deficiency of other specified B group vitamins: Secondary | ICD-10-CM

## 2021-07-09 DIAGNOSIS — G63 Polyneuropathy in diseases classified elsewhere: Secondary | ICD-10-CM | POA: Diagnosis not present

## 2021-07-09 DIAGNOSIS — I10 Essential (primary) hypertension: Secondary | ICD-10-CM

## 2021-07-09 DIAGNOSIS — Z23 Encounter for immunization: Secondary | ICD-10-CM

## 2021-07-09 LAB — CBC WITH DIFFERENTIAL/PLATELET
Basophils Absolute: 0.1 10*3/uL (ref 0.0–0.1)
Basophils Relative: 1 % (ref 0.0–3.0)
Eosinophils Absolute: 0.3 10*3/uL (ref 0.0–0.7)
Eosinophils Relative: 4.9 % (ref 0.0–5.0)
HCT: 43.9 % (ref 39.0–52.0)
Hemoglobin: 15 g/dL (ref 13.0–17.0)
Lymphocytes Relative: 34.2 % (ref 12.0–46.0)
Lymphs Abs: 2.2 10*3/uL (ref 0.7–4.0)
MCHC: 34.2 g/dL (ref 30.0–36.0)
MCV: 86.6 fl (ref 78.0–100.0)
Monocytes Absolute: 0.7 10*3/uL (ref 0.1–1.0)
Monocytes Relative: 11.2 % (ref 3.0–12.0)
Neutro Abs: 3.1 10*3/uL (ref 1.4–7.7)
Neutrophils Relative %: 48.7 % (ref 43.0–77.0)
Platelets: 295 10*3/uL (ref 150.0–400.0)
RBC: 5.07 Mil/uL (ref 4.22–5.81)
RDW: 13.4 % (ref 11.5–15.5)
WBC: 6.4 10*3/uL (ref 4.0–10.5)

## 2021-07-09 LAB — BASIC METABOLIC PANEL
BUN: 7 mg/dL (ref 6–23)
CO2: 29 mEq/L (ref 19–32)
Calcium: 9.4 mg/dL (ref 8.4–10.5)
Chloride: 101 mEq/L (ref 96–112)
Creatinine, Ser: 1.06 mg/dL (ref 0.40–1.50)
GFR: 80.47 mL/min (ref >60.00)
Glucose, Bld: 112 mg/dL — ABNORMAL HIGH (ref 70–99)
Potassium: 3.9 mEq/L (ref 3.5–5.1)
Sodium: 138 mEq/L (ref 135–145)

## 2021-07-09 LAB — FOLATE: Folate: >24.2 ng/mL (ref >5.9)

## 2021-07-09 LAB — C-REACTIVE PROTEIN: CRP: <1 mg/dL (ref 0.5–20.0)

## 2021-07-09 MED ORDER — CYANOCOBALAMIN 1000 MCG/ML IJ SOLN
1000.0000 ug | Freq: Once | INTRAMUSCULAR | Status: AC
  Administered 2021-07-09: 10:00:00 1000 ug via INTRAMUSCULAR

## 2021-07-09 NOTE — Patient Instructions (Signed)
Tonsillitis Tonsillitis is an infection of the throat that causes the tonsils to become red, tender, and swollen. Tonsils are tissues in the back of your throat. Each tonsil has crevices (crypts). Tonsils normally work to protect the body from infection. What are the causes? Sudden (acute) tonsillitis may be caused by a virus or bacteria, including streptococcal bacteria. Long-lasting (chronic) tonsillitis occurs when the crypts of the tonsils become filled with pieces of food and bacteria, which makes it easy for the tonsils to become repeatedly infected. Tonsillitis can be spread from person to person when it is caused by a virus or bacteria. It may be spread by inhaling droplets that are released with coughing or sneezing. You may also come into contact with viruses or bacteria on surfaces, such as cups or utensils. What are the signs or symptoms? Symptoms of this condition include: A sore throat. This may include trouble swallowing. White patches on the tonsils. Swollen tonsils. Fever. Headache. Tiredness. Loss of appetite. Snoring during sleep when you did not snore before. Small, foul-smelling, yellowish-white pieces of material (tonsilloliths) that you occasionally cough up or spit out. These can cause you to have bad breath. How is this diagnosed? This condition is diagnosed with a physical exam. Diagnosis can be confirmed with the results of lab tests, including a throat culture. How is this treated? Treatment for this condition depends on the cause, but usually focuses on treating the symptoms associated with it. Treatment may include: Medicines to relieve pain and manage fever. Steroid medicines to reduce swelling. Antibiotic medicines if the condition is caused by bacteria. If episodes of tonsillitis are severe and frequent, your health care provider may recommend surgery to remove the tonsils (tonsillectomy). Follow these instructions at home: Medicines Take over-the-counter  and prescription medicines only as told by your health care provider. If you were prescribed an antibiotic medicine, take it as told by your health care provider. Do not stop taking the antibiotic even if you start to feel better. Eating and drinking Drink enough fluid to keep your urine pale yellow. While your throat is sore, eat soft or liquid foods, such as sherbet, soups, or soft, warm cereals, such as oatmeal or hot wheat cereal. Drink warm liquids. Eat frozen ice pops. General instructions Rest as much as possible and get plenty of sleep. Gargle with a mixture of salt and water 3-4 times a day or as needed. To make salt water, completely dissolve -1 tsp (3-6 g) of salt in 1 cup (237 mL) of warm water. Do not swallow the mixture of salt and water. Wash your hands regularly with soap and water for at least 20 seconds. If soap and water are not available, use hand sanitizer. Do not share cups, bottles, or other utensils until your symptoms have gone away. Do not use any products that contain nicotine or tobacco. These products include cigarettes, chewing tobacco, and vaping devices, such as e-cigarettes. If you need help quitting, ask your health care provider. Keep all follow-up visits. This is important. Contact a health care provider if: You notice large, tender lumps in your neck that were not there before. You have a fever that does not go away after 2-3 days. You develop a rash. You cough up a green, yellow-brown, or bloody substance. You cannot swallow liquids or food for 24 hours. Only one of your tonsils is swollen. Get help right away if: You develop any new symptoms, such as vomiting, severe headache, stiff neck, chest pain, trouble breathing, or trouble  swallowing. You have severe throat pain along with drooling or voice changes. You have severe pain that is not controlled with medicines. You cannot fully open your mouth. You develop redness, swelling, or severe pain  anywhere in your neck. Summary Tonsillitis is an infection of the throat that causes the tonsils to become red, tender, and swollen. The most common symptom is pain in the throat. Tonsillitis is most often caused by a virus or bacteria. Get help right away if you develop any new symptoms, such as vomiting, severe headache, stiff neck, chest pain, or trouble breathing. This information is not intended to replace advice given to you by your health care provider. Make sure you discuss any questions you have with your health care provider. Document Revised: 10/24/2020 Document Reviewed: 10/24/2020 Elsevier Patient Education  [place].

## 2021-07-09 NOTE — Progress Notes (Signed)
Subjective:  Patient ID: Derek Tucker, male    DOB: Nov 23, 1970  Age: 51 y.o. MRN: [id number]  CC: Oral Swelling  This visit occurred during the SARS-CoV-2 public health emergency.  Safety protocols were in place, including screening questions prior to the visit, additional usage of staff PPE, and extensive cleaning of exam room while observing appropriate contact time as indicated for disinfecting solutions.    HPI [name] presents for f/up -  He complains of a 10 day hx of painless right tonsillar enlargement. He wants to get an HPV vaccine.  Outpatient Medications Prior to Visit  Medication Sig Dispense Refill   albuterol (VENTOLIN HFA) 108 (90 Base) MCG/ACT inhaler INHALE 2 PUFFS BY MOUTH EVERY 6 HOURS AS NEEDED FOR WHEEZING OR SHORTNESS OF BREATH 18 g 0   Esketamine HCl (SPRAVATO, 56 MG DOSE, NA) Place into the nose. Take 1 spray every 2 weeks. Administered in office.     esomeprazole (NEXIUM) 40 MG capsule Take 1 capsule (40 mg total) by mouth daily. 90 capsule 1   fish oil-omega-3 fatty acids 1000 MG capsule Take 2 g by mouth daily.     fluticasone (FLONASE) 50 MCG/ACT nasal spray Place 4 sprays into the nose daily. 16 g 11   fluticasone (FLOVENT HFA) 110 MCG/ACT inhaler Inhale 1 puff into the lungs 2 times daily as needed. 12 g 5   pramipexole (MIRAPEX) 1 MG tablet Take 1 mg by mouth at bedtime.     Spacer/Aero Chamber Mouthpiece MISC 1 Act by Does not apply route 2 (two) times daily. 1 each 11   Ubrogepant (UBRELVY) 100 MG TABS Take 1 tablet by mouth daily as needed. 30 tablet 1   No facility-administered medications prior to visit.    ROS Review of Systems  Constitutional:  Negative for chills, diaphoresis, fatigue and fever.  HENT: Negative.  Negative for sore throat, trouble swallowing and voice change.   Eyes: Negative.   Respiratory:  Negative for cough, chest tightness, shortness of breath and wheezing.   Cardiovascular:  Negative for chest pain,  palpitations and leg swelling.  Gastrointestinal:  Negative for abdominal pain, diarrhea, nausea and vomiting.  Endocrine: Negative.   Genitourinary: Negative.  Negative for difficulty urinating.  Musculoskeletal: Negative.   Skin: Negative.   Neurological: Negative.  Negative for dizziness, facial asymmetry and weakness.  Hematological:  Negative for adenopathy. Does not bruise/bleed easily.  Psychiatric/Behavioral: Negative.     Objective:  BP 128/84 (BP Location: Right Arm, Patient Position: Sitting, Cuff Size: Large)    Pulse 80    Temp 98.6 F (37 C) (Oral)    Ht 5\' 11"  (1.803 m)    Wt 197 lb (89.4 kg)    SpO2 98%    BMI 27.48 kg/m   BP Readings from Last 3 Encounters:  07/09/21 128/84  01/28/21 126/84  01/15/21 126/82    Wt Readings from Last 3 Encounters:  07/09/21 197 lb (89.4 kg)  01/28/21 194 lb (88 kg)  01/15/21 192 lb (87.1 kg)    Physical Exam Vitals reviewed.  Constitutional:      Appearance: He is not ill-appearing.  HENT:     Mouth/Throat:     Mouth: Mucous membranes are moist. No oral lesions.     Pharynx: Pharyngeal swelling and posterior oropharyngeal erythema present. No oropharyngeal exudate or uvula swelling.     Tonsils: No tonsillar exudate or tonsillar abscesses.     Comments: Right tonsil is approximately 2 times larger than  the left but they are both hypertrophied.  There is no tenting or evidence of an abscess.  There is mild diffuse erythema but no exudate. Eyes:     General: No scleral icterus. Cardiovascular:     Rate and Rhythm: Normal rate and regular rhythm.     Heart sounds: No murmur heard. Pulmonary:     Effort: Pulmonary effort is normal.     Breath sounds: No stridor. No wheezing, rhonchi or rales.  Abdominal:     General: Abdomen is flat.     Palpations: There is no mass.     Tenderness: There is no abdominal tenderness. There is no guarding.     Hernia: No hernia is present.  Musculoskeletal:        General: Normal range of  motion.     Cervical back: Neck supple.     Right lower leg: No edema.     Left lower leg: No edema.  Lymphadenopathy:     Head:     Right side of head: No submental, submandibular, tonsillar, preauricular, posterior auricular or occipital adenopathy.     Left side of head: No submental, submandibular, tonsillar, preauricular, posterior auricular or occipital adenopathy.     Cervical: No cervical adenopathy.     Right cervical: No superficial, deep or posterior cervical adenopathy.    Left cervical: No superficial, deep or posterior cervical adenopathy.     Upper Body:     Right upper body: No supraclavicular or axillary adenopathy.     Left upper body: No supraclavicular or axillary adenopathy.  Skin:    General: Skin is warm and dry.     Findings: No rash.  Neurological:     General: No focal deficit present.     Mental Status: He is alert.  Psychiatric:        Mood and Affect: Mood normal.        Behavior: Behavior normal.    Lab Results  Component Value Date   WBC 6.4 07/09/2021   HGB 15.0 07/09/2021   HCT 43.9 07/09/2021   PLT 295.0 07/09/2021   GLUCOSE 112 (H) 07/09/2021   CHOL 127 02/14/2021   TRIG 209.0 (H) 02/14/2021   HDL 38.60 (L) 02/14/2021   LDLDIRECT 71.0 02/14/2021   LDLCALC 64 11/29/2019   ALT 21 02/14/2021   AST 22 02/14/2021   NA 138 07/09/2021   K 3.9 07/09/2021   CL 101 07/09/2021   CREATININE 1.06 07/09/2021   BUN 7 07/09/2021   CO2 29 07/09/2021   TSH 2.46 02/14/2021   PSA 0.52 02/14/2021    MR Cervical Spine Wo Contrast  Result Date: 07/15/2020 CLINICAL DATA:  Cervical radiculopathy, infection suspected. Pain and numbness in right arm for several years. EXAM: MRI CERVICAL SPINE WITHOUT CONTRAST TECHNIQUE: Multiplanar, multisequence MR imaging of the cervical spine was performed. No intravenous contrast was administered. COMPARISON:  Report of MRI cervical spine 02/06/2008 at [facility]. Images are not available. FINDINGS: Alignment: No significant  listhesis is present. Cervical lordosis is preserved. Vertebrae: Marrow signal and vertebral body heights are normal. Cord: Normal signal and morphology. Posterior Fossa, vertebral arteries, paraspinal tissues: Craniocervical junction is normal. Flow is present in the vertebral arteries bilaterally. Visualized intracranial contents are normal. Disc levels: C2-3: Negative C3-4: Shallow central disc protrusion is present. There is partial effacement ventral CSF. Foramina are patent bilaterally. C4-5: A rightward disc osteophyte complex is present. Partial effacement ventral CSF is noted on right. Moderate foraminal narrowing is present on right.  Mild left foraminal narrowing is present. C5-6: Asymmetric right-sided facet hypertrophy is present. Mild uncovertebral spurring is present bilaterally. Mild foraminal narrowing is worse right than left. C6-7: Asymmetric right-sided facet hypertrophy is present. Mild uncovertebral spurring is noted bilaterally. No significant stenosis is present. C7-T1: Mild facet hypertrophy is present bilaterally. No significant disc protrusion or stenosis is present. IMPRESSION: 1. Moderate right foraminal stenosis at C4-5 secondary to a rightward disc osteophyte complex and uncovertebral spurring. 2. Mild foraminal narrowing bilaterally at C5-6 is worse right than left. 3. Asymmetric right-sided facet hypertrophy and mild uncovertebral spurring bilaterally at C6-7 without significant stenosis. 4. Shallow central disc protrusion at C3-4 without significant stenosis. 5. Mild facet hypertrophy bilaterally at C7-T1 without significant stenosis. Electronically Signed   By: San Morelle M.D.   On: 07/15/2020 08:38    Assessment & Plan:   Beau-Jacques was seen today for oral swelling.  Diagnoses and all orders for this visit:  Tonsillar enlargement- His labs are reassuring.  I recommended a CT scan with contrast to evaluate for abscess, mass, malignancy. -     C-reactive  protein; Future -     RPR; Future -     CT Soft Tissue Neck W Contrast; Future -     CBC with Differential/Platelet; Future -     CBC with Differential/Platelet -     RPR -     C-reactive protein  Essential hypertension- His blood pressure is well controlled. -     Basic metabolic panel; Future -     CBC with Differential/Platelet; Future -     CBC with Differential/Platelet -     Basic metabolic panel  Vitamin X21 deficiency neuropathy (HCC) -     Folate; Future -     CBC with Differential/Platelet; Future -     cyanocobalamin ((VITAMIN B-12)) injection 1,000 mcg -     CBC with Differential/Platelet -     Folate  Other orders -     HPV 9-valent vaccine,Recombinat   I am having Beau-Jacques C. Veillon "Beau" maintain his fish oil-omega-3 fatty acids, fluticasone, Spacer/Aero Chamber Mouthpiece, Ubrelvy, esomeprazole, Flovent HFA, (Esketamine HCl (SPRAVATO, 56 MG DOSE, NA)), pramipexole, and albuterol. We administered cyanocobalamin.  Meds ordered this encounter  Medications   cyanocobalamin ((VITAMIN B-12)) injection 1,000 mcg     Follow-up: Return in about 3 weeks (around 07/30/2021).  Scarlette Calico, MD

## 2021-07-10 ENCOUNTER — Encounter: Payer: Self-pay | Admitting: Internal Medicine

## 2021-07-10 ENCOUNTER — Ambulatory Visit (INDEPENDENT_AMBULATORY_CARE_PROVIDER_SITE_OTHER)
Admission: RE | Admit: 2021-07-10 | Discharge: 2021-07-10 | Disposition: A | Discharge status: Home / Self Care | Payer: Managed Care, Other (non HMO) | Source: Ambulatory Visit | Attending: Internal Medicine | Admitting: Internal Medicine

## 2021-07-10 DIAGNOSIS — J351 Hypertrophy of tonsils: Secondary | ICD-10-CM | POA: Diagnosis not present

## 2021-07-10 LAB — RPR: RPR Ser Ql: NONREACTIVE

## 2021-07-10 MED ORDER — IOHEXOL 300 MG/ML  SOLN
75.0000 mL | Freq: Once | INTRAMUSCULAR | Status: AC | PRN
  Administered 2021-07-10: 75 mL via INTRAVENOUS

## 2021-07-15 ENCOUNTER — Other Ambulatory Visit: Payer: Self-pay | Admitting: Internal Medicine

## 2021-07-29 DIAGNOSIS — J351 Hypertrophy of tonsils: Secondary | ICD-10-CM | POA: Insufficient documentation

## 2021-07-31 ENCOUNTER — Encounter: Payer: Self-pay | Admitting: Internal Medicine

## 2021-08-25 ENCOUNTER — Ambulatory Visit: Payer: Managed Care, Other (non HMO) | Admitting: Internal Medicine

## 2021-08-28 DIAGNOSIS — Z9089 Acquired absence of other organs: Secondary | ICD-10-CM | POA: Insufficient documentation

## 2021-08-28 HISTORY — PX: TONSILLECTOMY: SUR1361

## 2021-09-04 ENCOUNTER — Ambulatory Visit: Payer: Managed Care, Other (non HMO) | Admitting: Internal Medicine

## 2021-09-10 ENCOUNTER — Encounter (HOSPITAL_BASED_OUTPATIENT_CLINIC_OR_DEPARTMENT_OTHER): Payer: Self-pay

## 2021-09-10 ENCOUNTER — Ambulatory Visit (HOSPITAL_BASED_OUTPATIENT_CLINIC_OR_DEPARTMENT_OTHER): Admit: 2021-09-10 | Payer: Managed Care, Other (non HMO) | Admitting: Otolaryngology

## 2021-09-10 SURGERY — TONSILLECTOMY AND ADENOIDECTOMY
Anesthesia: General | Laterality: Bilateral

## 2021-09-15 ENCOUNTER — Ambulatory Visit (INDEPENDENT_AMBULATORY_CARE_PROVIDER_SITE_OTHER): Payer: Managed Care, Other (non HMO) | Admitting: Internal Medicine

## 2021-09-15 ENCOUNTER — Encounter: Payer: Self-pay | Admitting: Internal Medicine

## 2021-09-15 VITALS — BP 132/78 | HR 84 | Temp 98.9°F | Ht 71.0 in | Wt 198.0 lb

## 2021-09-15 DIAGNOSIS — G63 Polyneuropathy in diseases classified elsewhere: Secondary | ICD-10-CM | POA: Diagnosis not present

## 2021-09-15 DIAGNOSIS — Z23 Encounter for immunization: Secondary | ICD-10-CM

## 2021-09-15 DIAGNOSIS — R55 Syncope and collapse: Secondary | ICD-10-CM | POA: Diagnosis not present

## 2021-09-15 DIAGNOSIS — E538 Deficiency of other specified B group vitamins: Secondary | ICD-10-CM

## 2021-09-15 MED ORDER — CYANOCOBALAMIN 1000 MCG/ML IJ SOLN
1000.0000 ug | Freq: Once | INTRAMUSCULAR | Status: AC
  Administered 2021-09-15: 1000 ug via INTRAMUSCULAR

## 2021-09-15 NOTE — Patient Instructions (Signed)
Syncope, Adult ?Syncope refers to a condition in which a person temporarily loses consciousness. Syncope may also be called fainting or passing out. It is caused by a sudden decrease in blood flow to the brain. This can happen for a variety of reasons. ?Most causes of syncope are not dangerous. It can be triggered by things such as needle sticks, seeing blood, pain, or intense emotion. However, syncope can also be a sign of a serious medical problem, such as a heart abnormality. Other causes can include dehydration, migraines, or taking medicines that lower blood pressure. Your health care provider may do tests to find the reason why you are having syncope. ?If you faint, get medical help right away. Call your local emergency services (911 in the [place] ?Follow these instructions at home: ?Pay attention to any changes in your symptoms. Take these actions to stay safe and to help relieve your symptoms: ?Knowing when you may be about to faint ?Signs that you may be about to faint include: ?Feeling dizzy, weak, light-headed, or like the room is spinning. ?Feeling nauseous. ?Seeing spots or seeing all white or all black in your field of vision. ?Having cold, clammy skin or feeling warm and sweaty. ?Hearing ringing in the ears (tinnitus). ?If you start to feel like you might faint, sit or lie down right away. If sitting, put your head down between your legs. If lying down, raise (elevate) your feet above the level of your heart. ?Breathe deeply and steadily. Wait until all the symptoms have passed. ?Have someone stay with you until you feel stable. ?Medicines ?Take over-the-counter and prescription medicines only as told by your health care provider. ?If you are taking blood pressure or heart medicine, get up slowly and take several minutes to sit and then stand. This can reduce dizziness and decrease the risk of syncope. ?Lifestyle ?Do not drive, use machinery, or play sports until your health care provider says it is  okay. ?Do not drink alcohol. ?Do not use any products that contain nicotine or tobacco. These products include cigarettes, chewing tobacco, and vaping devices, such as e-cigarettes. If you need help quitting, ask your health care provider. ?Avoid hot tubs and saunas. ?General instructions ?Talk with your health care provider about your symptoms. You may need to have testing to understand the cause of your syncope. ?Drink enough fluid to keep your urine pale yellow. ?Avoid prolonged standing. If you must stand for a long time, do movements such as: ?Moving your legs. ?Crossing your legs. ?Flexing and stretching your leg muscles. ?Squatting. ?Keep all follow-up visits. This is important. ?Contact a health care provider if: ?You have episodes of near fainting. ?Get help right away if: ?You faint. ?You hit your head or are injured after fainting. ?You have any of these symptoms that may indicate trouble with your heart: ?Fast or irregular heartbeats (palpitations). ?Unusual pain in your chest, abdomen, or back. ?Shortness of breath. ?You have a seizure. ?You have a severe headache. ?You are confused. ?You have vision problems. ?You have severe weakness or trouble walking. ?You are bleeding from your mouth or rectum, or you have black or tarry stool. ?These symptoms may represent a serious problem that is an emergency. Do not wait to see if your symptoms will go away. Get medical help right away. Call your local emergency services (911 in the [place] Do not drive yourself to the hospital. ?Summary ?Syncope refers to a condition in which a person temporarily loses consciousness. Syncope may also be called fainting  or passing out. It is caused by a sudden decrease in blood flow to the brain. ?Signs that you may be about to faint include dizziness, feeling light-headed, feeling nauseous, sudden vision changes, or cold, clammy skin. ?Even though most causes of syncope are not dangerous, syncope can be a sign of a serious  medical problem. Get help right away if you faint. ?If you start to feel like you might faint, sit or lie down right away. If sitting, put your head down between your legs. If lying down, raise (elevate) your feet above the level of your heart. ?This information is not intended to replace advice given to you by your health care provider. Make sure you discuss any questions you have with your health care provider. ?Document Revised: 10/10/2020 Document Reviewed: 10/10/2020 ?Elsevier Patient Education ? [place]. ? ?

## 2021-09-15 NOTE — Progress Notes (Signed)
? ?Subjective:  ?Patient ID: Derek Tucker, male    DOB: [birth year]  Age: 51 y.o. MRN: [id number] ? ?CC: Loss of Consciousness ? ? ?HPI ?Derek Tucker presents for f/up - ? ?He has had intermittent syncopal episodes for nearly 20 years.  He says he easily gets dehydrated and sometimes has vasovagal episodes.  Over the last few years his syncope has worsened and now he is having symptoms during his sleep.  He says he wakes up feeling diaphoretic, nauseous and feeling like he is going to vomit.  It happens about 2 times per month.  He has fluctuations in his blood pressure from 122/70 down to 60/40.  His pulse ranges from 60-110.  He has done some research and thinks he should undergo a tilt table testing.  He is aware that this could also be neurogenic and psychogenic. ? ?Outpatient Medications Prior to Visit  ?Medication Sig Dispense Refill  ? albuterol (VENTOLIN HFA) 108 (90 Base) MCG/ACT inhaler INHALE 2 PUFFS BY MOUTH EVERY 6 HOURS AS NEEDED FOR WHEEZING OR SHORTNESS OF BREATH 9 g 2  ? Esketamine HCl (SPRAVATO, 56 MG DOSE, NA) Place into the nose. Take 1 spray every 2 weeks. Administered in office.    ? esomeprazole (NEXIUM) 40 MG capsule Take 1 capsule (40 mg total) by mouth daily. 90 capsule 1  ? fish oil-omega-3 fatty acids 1000 MG capsule Take 2 g by mouth daily.    ? fluticasone (FLONASE) 50 MCG/ACT nasal spray Place 4 sprays into the nose daily. 16 g 11  ? fluticasone (FLOVENT HFA) 110 MCG/ACT inhaler Inhale 1 puff into the lungs 2 times daily as needed. 12 g 5  ? pramipexole (MIRAPEX) 1 MG tablet Take 1 mg by mouth at bedtime.    ? Spacer/Aero Chamber Mouthpiece MISC 1 Act by Does not apply route 2 (two) times daily. 1 each 11  ? Ubrogepant (UBRELVY) 100 MG TABS Take 1 tablet by mouth daily as needed. 30 tablet 1  ? ?No facility-administered medications prior to visit.  ? ? ?ROS ?Review of Systems  ?Constitutional:  Positive for diaphoresis. Negative for fatigue.  ?HENT: Negative.    ?Eyes:  Negative.   ?Gastrointestinal:  Positive for nausea and vomiting. Negative for abdominal pain, constipation and diarrhea.  ?Endocrine: Negative.   ?Genitourinary: Negative.  Negative for difficulty urinating.  ?Musculoskeletal: Negative.  Negative for gait problem.  ?Skin: Negative.   ?Neurological:  Positive for syncope. Negative for dizziness, weakness and light-headedness.  ?Hematological:  Negative for adenopathy. Does not bruise/bleed easily.  ?Psychiatric/Behavioral:  Positive for dysphoric mood and sleep disturbance. Negative for self-injury and suicidal ideas. The patient is nervous/anxious.   ? ?Objective:  ?BP 132/78 (BP Location: Right Arm, Patient Position: Sitting, Cuff Size: Large)   Pulse 84   Temp 98.9 ?F (37.2 ?C) (Oral)   Ht '5\' 11"'$  (1.803 m)   Wt 198 lb (89.8 kg)   SpO2 98%   BMI 27.62 kg/m?  ? ?BP Readings from Last 3 Encounters:  ?09/15/21 132/78  ?07/09/21 128/84  ?01/28/21 126/84  ? ? ?Wt Readings from Last 3 Encounters:  ?09/15/21 198 lb (89.8 kg)  ?07/09/21 197 lb (89.4 kg)  ?01/28/21 194 lb (88 kg)  ? ? ?Physical Exam ?Vitals reviewed.  ?HENT:  ?   Nose: Nose normal.  ?   Mouth/Throat:  ?   Mouth: Mucous membranes are moist.  ?Eyes:  ?   General: No scleral icterus. ?   Conjunctiva/sclera: Conjunctivae normal.  ?  Cardiovascular:  ?   Rate and Rhythm: Normal rate and regular rhythm.  ?   Heart sounds: No murmur heard. ?Pulmonary:  ?   Effort: Pulmonary effort is normal.  ?   Breath sounds: No stridor. No wheezing, rhonchi or rales.  ?Abdominal:  ?   General: Abdomen is flat.  ?   Palpations: There is no mass.  ?   Tenderness: There is no abdominal tenderness. There is no guarding.  ?   Hernia: No hernia is present.  ?Musculoskeletal:     ?   General: Normal range of motion.  ?   Cervical back: Neck supple.  ?   Right lower leg: No edema.  ?   Left lower leg: No edema.  ?Lymphadenopathy:  ?   Cervical: No cervical adenopathy.  ?Skin: ?   General: Skin is dry.  ?Neurological:  ?   General:  No focal deficit present.  ?   Mental Status: Mental status is at baseline.  ? ? ?Lab Results  ?Component Value Date  ? WBC 6.4 07/09/2021  ? HGB 15.0 07/09/2021  ? HCT 43.9 07/09/2021  ? PLT 295.0 07/09/2021  ? GLUCOSE 112 (H) 07/09/2021  ? CHOL 127 02/14/2021  ? TRIG 209.0 (H) 02/14/2021  ? HDL 38.60 (L) 02/14/2021  ? LDLDIRECT 71.0 02/14/2021  ? [place] 64 11/29/2019  ? ALT 21 02/14/2021  ? AST 22 02/14/2021  ? NA 138 07/09/2021  ? K 3.9 07/09/2021  ? CL 101 07/09/2021  ? CREATININE 1.06 07/09/2021  ? BUN 7 07/09/2021  ? CO2 29 07/09/2021  ? TSH 2.46 02/14/2021  ? PSA 0.52 02/14/2021  ? ? ?CT Soft Tissue Neck W Contrast ? ?Result Date: 07/10/2021 ?CLINICAL DATA:  Ten-day history of enlarged right tonsil, firm EXAM: CT NECK WITH CONTRAST TECHNIQUE: Multidetector CT imaging of the neck was performed using the standard protocol following the bolus administration of intravenous contrast. RADIATION DOSE REDUCTION: This exam was performed according to the departmental dose-optimization program which includes automated exposure control, adjustment of the mA and/or kV according to patient size and/or use of iterative reconstruction technique. CONTRAST:  68m OMNIPAQUE IOHEXOL 300 MG/ML  SOLN COMPARISON:  Cervical spine MRI 07/15/2020, maxillofacial CT 12/19/2003 FINDINGS: Pharynx and larynx: The nasal cavity and nasopharynx are unremarkable. The oral cavity and oropharynx are unremarkable. The parapharyngeal spaces are clear. The tonsils are unremarkable in appearance. The hypopharynx and larynx are unremarkable. The vocal folds are normal. There is no abnormal fluid collection or enhancing mass lesion. Salivary glands: The parotid and submandibular glands are unremarkable. Thyroid: Unremarkable. Lymph nodes: There is no pathologic lymphadenopathy in the neck. Vascular: Unremarkable. Limited intracranial: Imaged portions of the intracranial compartment are unremarkable. Visualized orbits: The partially imaged globes and  orbits are unremarkable. Mastoids and visualized paranasal sinuses: The imaged paranasal sinuses are clear. The imaged mastoid air cells are clear. Skeleton: Postsurgical changes are noted in the mandible with presumed synthetic graft material along the mandibular bodies. The graft material is fractured anteriorly just to the right of midline (6-2, 3-55). Postsurgical changes are also noted along the anterior maxillary sinus walls bilaterally. Otherwise, there is no acute osseous abnormality or aggressive osseous lesion. Upper chest: Imaged lung apices are clear. Other: There is no abnormality at the indicated site of tenderness the right neck (3-69). There is mild fatty atrophy of the masseter muscles bilaterally. IMPRESSION: 1. No abnormality at the indicated site of tenderness in the right neck. No abnormal soft tissue mass, fluid collection,  or lymph node. 2. Unremarkable appearance of the tonsils. 3. Synthetic graft material along the mandibular bodies which is fractured anteriorly just to the right of midline. Electronically Signed   By: Valetta Mole M.D.   On: 07/10/2021 11:20  ? ? ?Assessment & Plan:  ? ?Derek was seen today for loss of consciousness. ? ?Diagnoses and all orders for this visit: ? ?Syncope, unspecified syncope type ?-     Ambulatory referral to Cardiology ? ?Vitamin B12 deficiency neuropathy (HCC)-we will continue parenteral B12 replacement therapy. ?-     cyanocobalamin ((VITAMIN B-12)) injection 1,000 mcg ? ?Other orders ?-     HPV 9-valent vaccine,Recombinat ? ? ?I am having Derek Tucker "Beau" maintain his fish oil-omega-3 fatty acids, fluticasone, Spacer/Aero Chamber Mouthpiece, Ubrelvy, esomeprazole, Flovent HFA, (Esketamine HCl (SPRAVATO, 56 MG DOSE, NA)), pramipexole, and albuterol. We administered cyanocobalamin. ? ?Meds ordered this encounter  ?Medications  ? cyanocobalamin ((VITAMIN B-12)) injection 1,000 mcg  ? ? ? ?Follow-up: Return in about 6 months (around  03/17/2022). ? ?Scarlette Calico, MD ?

## 2021-09-18 ENCOUNTER — Ambulatory Visit: Payer: Managed Care, Other (non HMO) | Admitting: Internal Medicine

## 2021-09-24 ENCOUNTER — Ambulatory Visit: Payer: Managed Care, Other (non HMO) | Admitting: Internal Medicine

## 2021-09-25 ENCOUNTER — Encounter: Payer: Self-pay | Admitting: Internal Medicine

## 2021-10-23 ENCOUNTER — Ambulatory Visit: Payer: Managed Care, Other (non HMO) | Admitting: Cardiovascular Disease

## 2021-10-31 ENCOUNTER — Ambulatory Visit (INDEPENDENT_AMBULATORY_CARE_PROVIDER_SITE_OTHER): Payer: Commercial Managed Care - HMO

## 2021-10-31 ENCOUNTER — Ambulatory Visit: Payer: Commercial Managed Care - HMO | Admitting: Cardiovascular Disease

## 2021-10-31 ENCOUNTER — Encounter: Payer: Self-pay | Admitting: Cardiovascular Disease

## 2021-10-31 VITALS — BP 104/76 | HR 75 | Ht 71.0 in | Wt 198.4 lb

## 2021-10-31 DIAGNOSIS — R55 Syncope and collapse: Secondary | ICD-10-CM

## 2021-10-31 NOTE — Progress Notes (Signed)
Cardiology Office Note:    Date:  11/02/2021   ID:  Derek Tucker, DOB [birth year], MRN [id number]  PCP:  Janith Lima, MD   [facility] Providers Cardiologist:  Sanda Klein, MD     Referring MD: Janith Lima, MD   Chief Complaint  Patient presents with   Loss of Consciousness  Derek Tucker is a 51 y.o. male who is being seen today for the evaluation of recurrent syncope at the request of Janith Lima, MD.   History of Present Illness:    Derek Tucker is a 51 y.o. male with a hx of multiple syncopal events, starting in childhood.  He estimates that he is lost consciousness over 30 times.  He recalls that the first time he lost consciousness he was in pre-k.  One of the other children accidentally sat on a chair on his fingernail.  When he saw that his finger was bleeding, he passed out.  He has had syncopal events with painful blood draws or with extreme emotion.  The events are preceded and followed by profuse diaphoresis and nausea sometimes with hyperemesis.  He recovers fully within 2 or 3 minutes and although he may be briefly disoriented, he comes to full orientation within 15 or 20 seconds.  He has never had convulsions or loss of bladder/bowel sphincter tone.  He has never had serious injuries.  He can usually tell when the events are coming on, but cannot always avoid full syncope.  On several occasions he is checked his blood pressure immediately following the events and its been very low around 60/40.  The heart rate is usually around 60.  If he tries to get up too soon the symptoms worsen.  A little more peculiar syndrome is the fact that he sometimes wakes up in the middle of the night drenched in sweat and nauseous and without getting out of bed checks his blood pressure and found that to be severely low as well.  These events are also quite brief.  He is not sure whether they may be associated with nightmares.  Otherwise has no  cardiovascular complaints.The patient specifically denies any chest pain at rest or with exertion, dyspnea at rest or with exertion, orthopnea, paroxysmal nocturnal dyspnea, focal neurological deficits, intermittent claudication, lower extremity edema, unexplained weight gain, cough, hemoptysis or wheezing.  He is accompanied today by his significant other, Paul.  Derek Tucker has witnessed several of these events and confirms that the loss of consciousness is complete but brief.  Past Medical History:  Diagnosis Date   Allergy    Arthritis    Asthma     Past Surgical History:  Procedure Laterality Date   APPENDECTOMY  06/15/1980   KNEE ARTHROSCOPY  06/15/2010   TONSILLECTOMY  08/28/2021    Current Medications: Current Meds  Medication Sig   albuterol (VENTOLIN HFA) 108 (90 Base) MCG/ACT inhaler INHALE 2 PUFFS BY MOUTH EVERY 6 HOURS AS NEEDED FOR WHEEZING OR SHORTNESS OF BREATH   Esketamine HCl (SPRAVATO, 56 MG DOSE, NA) Place into the nose. Take 1 spray every 2 weeks. Administered in office.   fish oil-omega-3 fatty acids 1000 MG capsule Take 2 g by mouth daily.   fluticasone (FLONASE) 50 MCG/ACT nasal spray Place 4 sprays into the nose daily.   fluticasone (FLOVENT HFA) 110 MCG/ACT inhaler Inhale 1 puff into the lungs 2 times daily as needed.   pramipexole (MIRAPEX) 1 MG tablet Take 1 mg by mouth at bedtime.  Spacer/Aero Chamber Mouthpiece MISC 1 Act by Does not apply route 2 (two) times daily.     Allergies:   Dulera [mometasone furo-formoterol fum] and Imitrex [sumatriptan]   Social History   Socioeconomic History   Marital status: Soil scientist    Spouse name: Not on file   Number of children: Not on file   Years of education: Not on file   Highest education level: Not on file  Occupational History   Not on file  Tobacco Use   Smoking status: Former   Smokeless tobacco: Never  Substance and Sexual Activity   Alcohol use: Yes    Alcohol/week: 3.0 standard drinks     Types: 3 Glasses of wine per week   Drug use: Not on file   Sexual activity: Not Currently  Other Topics Concern   Not on file  Social History Narrative   Not on file   Social Determinants of Health   Financial Resource Strain: Not on file  Food Insecurity: Not on file  Transportation Needs: Not on file  Physical Activity: Not on file  Stress: Not on file  Social Connections: Not on file     Family History: The patient's family history includes Arthritis in his mother; Depression in his brother and father; Heart disease in his father; Hypertension in his father and mother. There is no history of Asthma, Alcohol abuse, Cancer, COPD, Diabetes, Drug abuse, Hyperlipidemia, Kidney disease, or Stroke.  ROS:   Please see the history of present illness.     All other systems reviewed and are negative.  EKGs/Labs/Other Studies Reviewed:    The following studies were reviewed today: Notes from PCP  EKG:  EKG is ordered today.  The ekg ordered today demonstrates normal sinus rhythm, normal tracing  Recent Labs: 02/14/2021: ALT 21; TSH 2.46 07/09/2021: BUN 7; Creatinine, Ser 1.06; Hemoglobin 15.0; Platelets 295.0; Potassium 3.9; Sodium 138  Recent Lipid Panel    Component Value Date/Time   CHOL 127 02/14/2021 1255   TRIG 209.0 (H) 02/14/2021 1255   HDL 38.60 (L) 02/14/2021 1255   CHOLHDL 3 02/14/2021 1255   VLDL 41.8 (H) 02/14/2021 1255   LDLCALC 64 11/29/2019 1044   LDLDIRECT 71.0 02/14/2021 1255     Risk Assessment/Calculations:           Physical Exam:    VS:  BP 104/76 (BP Location: Left Arm, Patient Position: Sitting, Cuff Size: Large)   Pulse 75   Ht 5' 11"  (1.803 m)   Wt 198 lb 6.4 oz (90 kg)   SpO2 97%   BMI 27.67 kg/m     Wt Readings from Last 3 Encounters:  10/31/21 198 lb 6.4 oz (90 kg)  09/15/21 198 lb (89.8 kg)  07/09/21 197 lb (89.4 kg)     GEN:  Well nourished, well developed in no acute distress HEENT: Normal NECK: No JVD; No carotid  bruits LYMPHATICS: No lymphadenopathy CARDIAC: RRR, no murmurs, rubs, gallops RESPIRATORY:  Clear to auscultation without rales, wheezing or rhonchi  ABDOMEN: Soft, non-tender, non-distended MUSCULOSKELETAL:  No edema; No deformity  SKIN: Warm and dry NEUROLOGIC:  Alert and oriented x 3 PSYCHIATRIC:  Normal affect   ASSESSMENT:    1. 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  Although his heart rate is inappropriately slow for the degree of hypotension, he does  not appear to have profound bradycardia.  We discussed the fact that this is usually a lifelong disorder that can wax and wane in frequency and severity.  He should always stay very well-hydrated and should engage in a relatively liberal intake of sodium in his diet.  He should avoid triggers as much as possible.  He should pay immediate he needs to the prodromal symptoms and immediately lay down horizontally, being very careful when he gets up as this often will lead to recurrent symptoms.  Avoid standing in one place without movement.  I do not think a tilt table test will add to our understanding of his syndrome even though it would confirm the diagnosis. I am not sure what to make of the events that occur while he is lying in bed.  This is quite unusual for vasovagal syncope.  It is reasonable to have him wear an arrhythmia monitor and check an echocardiogram.  Also reasonable to do some screening studies for adrenal insufficiency, will check an a.m. cortisol.           Medication Adjustments/Labs and Tests Ordered: Current medicines are reviewed at length with the patient today.  Concerns regarding medicines are outlined above.  Orders Placed This Encounter  Procedures   Cortisol-am, blood   LONG TERM MONITOR-LIVE TELEMETRY (3-14 DAYS)   EKG 12-Lead    ECHOCARDIOGRAM COMPLETE   No orders of the defined types were placed in this encounter.   Patient Instructions  Medication Instructions:  No changes  *If you need a refill on your cardiac medications before your next appointment, please call your pharmacy*   Lab Work: Your provider would like for you to return in the next few weeks to have the following labs drawn: AM Cortisol level lab. You do not need an appointment for the lab. Once in our office lobby there is a podium where you can sign in and ring the doorbell to alert [place] that you are here. The lab is open from 8:00 am to 4 pm; closed for lunch from 12:45pm-1:45pm.  You may also go to any of these [facility] locations:   [place] ([place]) - [place] [address] [address]  If you have labs (blood work) drawn today and your tests are completely normal, you will receive your results only by: [employer] (if you have MyChart) OR A paper copy in the mail If you have any lab test that is abnormal or we need to change your treatment, we will call you to review the results.   Testing/Procedures: Your physician has requested that you have an echocardiogram. Echocardiography is a painless test that uses sound waves to create images of your heart. It provides your doctor with information about the size and shape of your heart and how well your heart's chambers and valves are working. You may receive an ultrasound enhancing agent through an IV if needed to better visualize your heart during the echo.This procedure takes approximately one hour. There are no restrictions for this procedure. This will take place at the [address], [address].     Follow-Up: At [facility], you and your health needs are our priority.  As part of our continuing mission to provide you with exceptional heart care, we have created designated Provider Care Teams.  These Care  Teams include your primary Cardiologist (physician) and Advanced Practice Providers (APPs -  Physician Assistants and  Nurse Practitioners) who all work together to provide you with the care you need, when you need it.  We recommend signing up for the patient portal called "MyChart".  Sign up information is provided on this After Visit Summary.  MyChart is used to connect with patients for Virtual Visits (Telemedicine).  Patients are able to view lab/test results, encounter notes, upcoming appointments, etc.  Non-urgent messages can be sent to your provider as well.   To learn more about what you can do with MyChart, go to [email or web address].    Your next appointment:   12 month(s)  The format for your next appointment:   In Person  Provider:   Sanda Klein, MD     Other Instructions  ZIO AT Long term monitor-Live Telemetry  Your physician has requested you wear a ZIO patch monitor for 14 days.  This is a single patch monitor. [employer] supplies one patch monitor per enrollment. Additional  stickers are not available.  Please do not apply patch if you will be having a Nuclear Stress Test, Echocardiogram, Cardiac CT, MRI,  or Chest Xray during the period you would be wearing the monitor. The patch cannot be worn during  these tests. You cannot remove and re-apply the ZIO AT patch monitor.  Your ZIO patch monitor will be mailed 3 day USPS to your address on file. It may take 3-5 days to  receive your monitor after you have been enrolled.  Once you have received your monitor, please review the enclosed instructions. Your monitor has  already been registered assigning a specific monitor serial # to you.   Billing and Patient Assistance Program information  Theodore Demark has been supplied with any insurance information on record for billing. [employer] offers a sliding scale Patient Assistance Program for patients without insurance, or whose  insurance does not completely cover the cost of  the ZIO patch monitor. You must apply for the  Patient Assistance Program to qualify for the discounted rate. To apply, call [employer] at [phone],  select option 4, select option 2 , ask to apply for the Patient Assistance Program, (you can request an  interpreter if needed). [employer] will ask your household income and how many people are in your  household. [employer] will quote your out-of-pocket cost based on this information. They will also be able  to set up a 12 month interest free payment plan if needed.  Applying the monitor   Shave hair from upper left chest.  Hold the abrader disc by orange tab. Rub the abrader in 40 strokes over left upper chest as indicated in  your monitor instructions.  Clean area with 4 enclosed alcohol pads. Use all pads to ensure the area is cleaned thoroughly. Let  dry.  Apply patch as indicated in monitor instructions. Patch will be placed under collarbone on left side of  chest with arrow pointing upward.  Rub patch adhesive wings for 2 minutes. Remove the white label marked "1". Remove the white label  marked "2". Rub patch adhesive wings for 2 additional minutes.  While looking in a mirror, press and release button in center of patch. A small green light will flash 3-4  times. This will be your only indicator that the monitor has been turned on.  Do not shower for the first 24 hours. You may shower after the first 24 hours.  Press the button if you feel a symptom. You will hear a small click. Record Date, Time and Symptom in  the Patient  Log.   Starting the Gateway  In your kit there is a small plastic box the size of a cellphone. This is Airline pilot. It transmits all your  recorded data to [facility]. This box must always stay within 10 feet of you. Open the box and push the *  button. There will be a light that blinks orange and then green a few times. When the light stops  blinking, the Gateway is connected to the ZIO patch. Call [employer] at  [phone] to confirm your monitor is transmitting.  Returning your monitor  Remove your patch and place it inside the [place]. In the lower half of the Gateway there is a white  bag with prepaid postage on it. Place Gateway in bag and seal. Mail package back to [place] as soon as  possible. Your physician should have your final report approximately 7 days after you have mailed back  your monitor. Call [place] at [phone] if you have questions regarding your ZIO AT  patch monitor. Call them immediately if you see an orange light blinking on your monitor.  If your monitor falls off in less than 4 days, contact our Monitor department at [phone]. If your  monitor becomes loose or falls off after 4 days call [employer] at [phone] for suggestions on  securing your monitor       Signed, Sanda Klein, MD  11/02/2021 8:35 PM    [place]

## 2021-10-31 NOTE — Patient Instructions (Addendum)
Medication Instructions:  No changes  *If you need a refill on your cardiac medications before your next appointment, please call your pharmacy*   Lab Work: Your provider would like for you to return in the next few weeks to have the following labs drawn: AM Cortisol level lab. You do not need an appointment for the lab. Once in our office lobby there is a podium where you can sign in and ring the doorbell to alert [place] that you are here. The lab is open from 8:00 am to 4 pm; closed for lunch from 12:45pm-1:45pm.  You may also go to any of these [facility] locations:   [facility] ([place] [place] [address] [address]  If you have labs (blood work) drawn today and your tests are completely normal, you will receive your results only by: [employer] (if you have MyChart) OR A paper copy in the mail If you have any lab test that is abnormal or we need to change your treatment, we will call you to review the results.   Testing/Procedures: Your physician has requested that you have an echocardiogram. Echocardiography is a painless test that uses sound waves to create images of your heart. It provides your doctor with information about the size and shape of your heart and how well your heart's chambers and valves are working. You may receive an ultrasound enhancing agent through an IV if needed to better visualize your heart during the echo.This procedure takes approximately one hour. There are no restrictions for this procedure. This will take place at the [address].     Follow-Up: At [facility], you and your health needs are our priority.  As part of our continuing mission to provide you with exceptional heart care, we have created designated Provider Care Teams.  These Care Teams include your primary Cardiologist (physician) and Advanced Practice Providers (APPs -  Physician Assistants and  Nurse Practitioners) who all work together to provide you with the care you need, when you need it.  We recommend signing up for the patient portal called "MyChart".  Sign up information is provided on this After Visit Summary.  MyChart is used to connect with patients for Virtual Visits (Telemedicine).  Patients are able to view lab/test results, encounter notes, upcoming appointments, etc.  Non-urgent messages can be sent to your provider as well.   To learn more about what you can do with MyChart, go to [email or web address].    Your next appointment:   12 month(s)  The format for your next appointment:   In Person  Provider:   Sanda Klein, MD     Other Instructions  ZIO AT Long term monitor-Live Telemetry  Your physician has requested you wear a ZIO patch monitor for 14 days.  This is a single patch monitor. [employer] supplies one patch monitor per enrollment. Additional  stickers are not available.  Please do not apply patch if you will be having a Nuclear Stress Test, Echocardiogram, Cardiac CT, MRI,  or Chest Xray during the period you would be wearing the monitor. The patch cannot be worn during  these tests. You cannot remove and re-apply the ZIO AT patch monitor.  Your ZIO patch monitor will be mailed 3 day USPS to your address on file. It may take 3-5 days to  receive your monitor after you have been enrolled.  Once you have received your  monitor, please review the enclosed instructions. Your monitor has  already been registered assigning a specific monitor serial # to you.   Billing and Patient Assistance Program information  Theodore Demark has been supplied with any insurance information on record for billing. [employer] offers a sliding scale Patient Assistance Program for patients without insurance, or whose  insurance does not completely cover the cost of the ZIO patch monitor. You must apply for the  Patient Assistance Program to qualify for the discounted rate. To apply,  call [employer] at [phone],  select option 4, select option 2 , ask to apply for the Patient Assistance Program, (you can request an  interpreter if needed). [employer] will ask your household income and how many people are in your  household. [employer] will quote your out-of-pocket cost based on this information. They will also be able  to set up a 12 month interest free payment plan if needed.  Applying the monitor   Shave hair from upper left chest.  Hold the abrader disc by orange tab. Rub the abrader in 40 strokes over left upper chest as indicated in  your monitor instructions.  Clean area with 4 enclosed alcohol pads. Use all pads to ensure the area is cleaned thoroughly. Let  dry.  Apply patch as indicated in monitor instructions. Patch will be placed under collarbone on left side of  chest with arrow pointing upward.  Rub patch adhesive wings for 2 minutes. Remove the white label marked "1". Remove the white label  marked "2". Rub patch adhesive wings for 2 additional minutes.  While looking in a mirror, press and release button in center of patch. A small green light will flash 3-4  times. This will be your only indicator that the monitor has been turned on.  Do not shower for the first 24 hours. You may shower after the first 24 hours.  Press the button if you feel a symptom. You will hear a small click. Record Date, Time and Symptom in  the Patient Log.   Starting the Gateway  In your kit there is a Hydrographic surveyor box the size of a cellphone. This is Airline pilot. It transmits all your  recorded data to [facility]. This box must always stay within 10 feet of you. Open the box and push the *  button. There will be a light that blinks orange and then green a few times. When the light stops  blinking, the Gateway is connected to the ZIO patch. Call [employer] at [phone] to confirm your monitor is transmitting.  Returning your monitor  Remove your patch and place it inside the  [place]. In the lower half of the Gateway there is a white  bag with prepaid postage on it. Place Gateway in bag and seal. Mail package back to [place] as soon as  possible. Your physician should have your final report approximately 7 days after you have mailed back  your monitor. Call [place] at [phone] if you have questions regarding your ZIO AT  patch monitor. Call them immediately if you see an orange light blinking on your monitor.  If your monitor falls off in less than 4 days, contact our Monitor department at [phone]. If your  monitor becomes loose or falls off after 4 days call [employer] at [phone] for suggestions on  securing your monitor

## 2021-10-31 NOTE — Progress Notes (Unsigned)
Enrolled patient for a 14 day Zio AT monitor to be mailed to patients home.  

## 2021-11-02 ENCOUNTER — Encounter: Payer: Self-pay | Admitting: Cardiovascular Disease

## 2021-11-17 ENCOUNTER — Ambulatory Visit (HOSPITAL_COMMUNITY): Payer: Commercial Managed Care - HMO | Attending: Cardiovascular Disease

## 2021-11-17 DIAGNOSIS — Z87891 Personal history of nicotine dependence: Secondary | ICD-10-CM | POA: Insufficient documentation

## 2021-11-17 DIAGNOSIS — Z8249 Family history of ischemic heart disease and other diseases of the circulatory system: Secondary | ICD-10-CM | POA: Insufficient documentation

## 2021-11-17 DIAGNOSIS — J45909 Unspecified asthma, uncomplicated: Secondary | ICD-10-CM | POA: Insufficient documentation

## 2021-11-17 DIAGNOSIS — R079 Chest pain, unspecified: Secondary | ICD-10-CM | POA: Insufficient documentation

## 2021-11-17 DIAGNOSIS — R0602 Shortness of breath: Secondary | ICD-10-CM | POA: Diagnosis not present

## 2021-11-17 DIAGNOSIS — R55 Syncope and collapse: Secondary | ICD-10-CM | POA: Insufficient documentation

## 2021-11-17 LAB — ECHOCARDIOGRAM COMPLETE
Area-P 1/2: 3.17 cm2
S' Lateral: 2.8 cm

## 2021-11-17 LAB — CORTISOL-AM, BLOOD: Cortisol - AM: 10.8 ug/dL (ref 6.2–19.4)

## 2021-11-18 ENCOUNTER — Encounter: Payer: Self-pay | Admitting: Cardiovascular Disease

## 2021-11-26 ENCOUNTER — Encounter: Payer: Self-pay | Admitting: Internal Medicine

## 2021-11-26 ENCOUNTER — Ambulatory Visit (INDEPENDENT_AMBULATORY_CARE_PROVIDER_SITE_OTHER): Payer: Commercial Managed Care - HMO | Admitting: Internal Medicine

## 2021-11-26 VITALS — BP 132/82 | HR 79 | Temp 98.2°F | Resp 16 | Ht 71.0 in | Wt 201.0 lb

## 2021-11-26 DIAGNOSIS — E538 Deficiency of other specified B group vitamins: Secondary | ICD-10-CM | POA: Diagnosis not present

## 2021-11-26 DIAGNOSIS — G63 Polyneuropathy in diseases classified elsewhere: Secondary | ICD-10-CM | POA: Diagnosis not present

## 2021-11-26 DIAGNOSIS — I10 Essential (primary) hypertension: Secondary | ICD-10-CM | POA: Diagnosis not present

## 2021-11-26 DIAGNOSIS — F331 Major depressive disorder, recurrent, moderate: Secondary | ICD-10-CM | POA: Diagnosis not present

## 2021-11-26 MED ORDER — CYANOCOBALAMIN 1000 MCG/ML IJ SOLN
1000.0000 ug | Freq: Once | INTRAMUSCULAR | Status: AC
  Administered 2021-11-26: 1000 ug via INTRAMUSCULAR

## 2021-11-26 NOTE — Patient Instructions (Signed)
Hypertension, Adult High blood pressure (hypertension) is when the force of blood pumping through the arteries is too strong. The arteries are the blood vessels that carry blood from the heart throughout the body. Hypertension forces the heart to work harder to pump blood and may cause arteries to become narrow or stiff. Untreated or uncontrolled hypertension can lead to a heart attack, heart failure, a stroke, kidney disease, and other problems. A blood pressure reading consists of a higher number over a lower number. Ideally, your blood pressure should be below 120/80. The first ("top") number is called the systolic pressure. It is a measure of the pressure in your arteries as your heart beats. The second ("bottom") number is called the diastolic pressure. It is a measure of the pressure in your arteries as the heart relaxes. What are the causes? The exact cause of this condition is not known. There are some conditions that result in high blood pressure. What increases the risk? Certain factors may make you more likely to develop high blood pressure. Some of these risk factors are under your control, including: Smoking. Not getting enough exercise or physical activity. Being overweight. Having too much fat, sugar, calories, or salt (sodium) in your diet. Drinking too much alcohol. Other risk factors include: Having a personal history of heart disease, diabetes, high cholesterol, or kidney disease. Stress. Having a family history of high blood pressure and high cholesterol. Having obstructive sleep apnea. Age. The risk increases with age. What are the signs or symptoms? High blood pressure may not cause symptoms. Very high blood pressure (hypertensive crisis) may cause: Headache. Fast or irregular heartbeats (palpitations). Shortness of breath. Nosebleed. Nausea and vomiting. Vision changes. Severe chest pain, dizziness, and seizures. How is this diagnosed? This condition is diagnosed by  measuring your blood pressure while you are seated, with your arm resting on a flat surface, your legs uncrossed, and your feet flat on the floor. The cuff of the blood pressure monitor will be placed directly against the skin of your upper arm at the level of your heart. Blood pressure should be measured at least twice using the same arm. Certain conditions can cause a difference in blood pressure between your right and left arms. If you have a high blood pressure reading during one visit or you have normal blood pressure with other risk factors, you may be asked to: Return on a different day to have your blood pressure checked again. Monitor your blood pressure at home for 1 week or longer. If you are diagnosed with hypertension, you may have other blood or imaging tests to help your health care provider understand your overall risk for other conditions. How is this treated? This condition is treated by making healthy lifestyle changes, such as eating healthy foods, exercising more, and reducing your alcohol intake. You may be referred for counseling on a healthy diet and physical activity. Your health care provider may prescribe medicine if lifestyle changes are not enough to get your blood pressure under control and if: Your systolic blood pressure is above 130. Your diastolic blood pressure is above 80. Your personal target blood pressure may vary depending on your medical conditions, your age, and other factors. Follow these instructions at home: Eating and drinking  Eat a diet that is high in fiber and potassium, and low in sodium, added sugar, and fat. An example of this eating plan is called the DASH diet. DASH stands for Dietary Approaches to Stop Hypertension. To eat this way: Eat   plenty of fresh fruits and vegetables. Try to fill one half of your plate at each meal with fruits and vegetables. Eat whole grains, such as whole-wheat pasta, brown rice, or whole-grain bread. Fill about one  fourth of your plate with whole grains. Eat or drink low-fat dairy products, such as skim milk or low-fat yogurt. Avoid fatty cuts of meat, processed or cured meats, and poultry with skin. Fill about one fourth of your plate with lean proteins, such as fish, chicken without skin, beans, eggs, or tofu. Avoid pre-made and processed foods. These tend to be higher in sodium, added sugar, and fat. Reduce your daily sodium intake. Many people with hypertension should eat less than 1,500 mg of sodium a day. Do not drink alcohol if: Your health care provider tells you not to drink. You are pregnant, may be pregnant, or are planning to become pregnant. If you drink alcohol: Limit how much you have to: 0-1 drink a day for women. 0-2 drinks a day for men. Know how much alcohol is in your drink. In the [place] one drink equals one 12 oz bottle of beer (355 mL), one 5 oz glass of wine (148 mL), or one 1 oz glass of hard liquor (44 mL). Lifestyle  Work with your health care provider to maintain a healthy body weight or to lose weight. Ask what an ideal weight is for you. Get at least 30 minutes of exercise that causes your heart to beat faster (aerobic exercise) most days of the week. Activities may include walking, swimming, or biking. Include exercise to strengthen your muscles (resistance exercise), such as Pilates or lifting weights, as part of your weekly exercise routine. Try to do these types of exercises for 30 minutes at least 3 days a week. Do not use any products that contain nicotine or tobacco. These products include cigarettes, chewing tobacco, and vaping devices, such as e-cigarettes. If you need help quitting, ask your health care provider. Monitor your blood pressure at home as told by your health care provider. Keep all follow-up visits. This is important. Medicines Take over-the-counter and prescription medicines only as told by your health care provider. Follow directions carefully. Blood  pressure medicines must be taken as prescribed. Do not skip doses of blood pressure medicine. Doing this puts you at risk for problems and can make the medicine less effective. Ask your health care provider about side effects or reactions to medicines that you should watch for. Contact a health care provider if you: Think you are having a reaction to a medicine you are taking. Have headaches that keep coming back (recurring). Feel dizzy. Have swelling in your ankles. Have trouble with your vision. Get help right away if you: Develop a severe headache or confusion. Have unusual weakness or numbness. Feel faint. Have severe pain in your chest or abdomen. Vomit repeatedly. Have trouble breathing. These symptoms may be an emergency. Get help right away. Call 911. Do not wait to see if the symptoms will go away. Do not drive yourself to the hospital. Summary Hypertension is when the force of blood pumping through your arteries is too strong. If this condition is not controlled, it may put you at risk for serious complications. Your personal target blood pressure may vary depending on your medical conditions, your age, and other factors. For most people, a normal blood pressure is less than 120/80. Hypertension is treated with lifestyle changes, medicines, or a combination of both. Lifestyle changes include losing weight, eating a healthy,   low-sodium diet, exercising more, and limiting alcohol. This information is not intended to replace advice given to you by your health care provider. Make sure you discuss any questions you have with your health care provider. Document Revised: 04/08/2021 Document Reviewed: 04/08/2021 Elsevier Patient Education  2023 Elsevier Inc.  

## 2021-11-26 NOTE — Progress Notes (Signed)
Subjective:  Patient ID: Derek Tucker, male    DOB: Dec 28, 1970  Age: 51 y.o. MRN: [id number]  CC: Hypertension   HPI Derek Tucker presents for f/up -  He is seeing a psychiatric nurse practitioner and is doing well on ketamine and pramipexole. He has responded well to this. He will soon be changing to a new psychiatrist.  He recently had a thorough cardiac work-up.  He feels well and offers no complaints today.  Outpatient Medications Prior to Visit  Medication Sig Dispense Refill   albuterol (VENTOLIN HFA) 108 (90 Base) MCG/ACT inhaler INHALE 2 PUFFS BY MOUTH EVERY 6 HOURS AS NEEDED FOR WHEEZING OR SHORTNESS OF BREATH 9 g 2   Esketamine HCl (SPRAVATO, 56 MG DOSE, NA) Place into the nose. Take 1 spray every 2 weeks. Administered in office.     fish oil-omega-3 fatty acids 1000 MG capsule Take 2 g by mouth daily.     fluticasone (FLONASE) 50 MCG/ACT nasal spray Place 4 sprays into the nose daily. 16 g 11   fluticasone (FLOVENT HFA) 110 MCG/ACT inhaler Inhale 1 puff into the lungs 2 times daily as needed. 12 g 5   pramipexole (MIRAPEX) 1 MG tablet Take 1 mg by mouth at bedtime.     Spacer/Aero Chamber Mouthpiece MISC 1 Act by Does not apply route 2 (two) times daily. 1 each 11   Ubrogepant (UBRELVY) 100 MG TABS Take 1 tablet by mouth daily as needed. 30 tablet 1   No facility-administered medications prior to visit.    ROS Review of Systems  Constitutional:  Negative for diaphoresis and fatigue.  HENT: Negative.    Eyes: Negative.   Respiratory:  Negative for cough and shortness of breath.   Cardiovascular:  Negative for chest pain, palpitations and leg swelling.  Gastrointestinal:  Negative for abdominal pain, constipation, diarrhea, nausea and vomiting.  Endocrine: Negative for cold intolerance.  Genitourinary: Negative.   Musculoskeletal: Negative.   Skin: Negative.   Neurological:  Negative for dizziness, weakness and light-headedness.  Hematological:   Negative for adenopathy. Does not bruise/bleed easily.  Psychiatric/Behavioral: Negative.  Negative for dysphoric mood, sleep disturbance and suicidal ideas. The patient is not nervous/anxious.     Objective:  BP 132/82 (BP Location: Left Arm, Patient Position: Sitting, Cuff Size: Large)   Pulse 79   Temp 98.2 F (36.8 C) (Oral)   Resp 16   Ht '5\' 11"'$  (1.803 m)   Wt 201 lb (91.2 kg)   SpO2 97%   BMI 28.03 kg/m   BP Readings from Last 3 Encounters:  11/26/21 132/82  10/31/21 104/76  09/15/21 132/78    Wt Readings from Last 3 Encounters:  11/26/21 201 lb (91.2 kg)  10/31/21 198 lb 6.4 oz (90 kg)  09/15/21 198 lb (89.8 kg)    Physical Exam Vitals reviewed.  HENT:     Nose: Nose normal.     Mouth/Throat:     Mouth: Mucous membranes are moist.  Eyes:     General: No scleral icterus.    Conjunctiva/sclera: Conjunctivae normal.  Cardiovascular:     Rate and Rhythm: Normal rate and regular rhythm.     Heart sounds: No murmur heard. Pulmonary:     Effort: Pulmonary effort is normal.     Breath sounds: No stridor. No wheezing, rhonchi or rales.  Abdominal:     General: Abdomen is flat.     Palpations: There is no mass.     Tenderness: There is no  abdominal tenderness. There is no guarding.     Hernia: No hernia is present.  Musculoskeletal:        General: Normal range of motion.     Cervical back: Neck supple.     Right lower leg: No edema.     Left lower leg: No edema.  Lymphadenopathy:     Cervical: No cervical adenopathy.  Skin:    General: Skin is warm and dry.  Neurological:     General: No focal deficit present.     Mental Status: He is alert.  Psychiatric:        Mood and Affect: Mood normal.        Behavior: Behavior normal.        Thought Content: Thought content normal.        Judgment: Judgment normal.     Lab Results  Component Value Date   WBC 6.4 07/09/2021   HGB 15.0 07/09/2021   HCT 43.9 07/09/2021   PLT 295.0 07/09/2021   GLUCOSE 112  (H) 07/09/2021   CHOL 127 02/14/2021   TRIG 209.0 (H) 02/14/2021   HDL 38.60 (L) 02/14/2021   LDLDIRECT 71.0 02/14/2021   LDLCALC 64 11/29/2019   ALT 21 02/14/2021   AST 22 02/14/2021   NA 138 07/09/2021   K 3.9 07/09/2021   CL 101 07/09/2021   CREATININE 1.06 07/09/2021   BUN 7 07/09/2021   CO2 29 07/09/2021   TSH 2.46 02/14/2021   PSA 0.52 02/14/2021    CT Soft Tissue Neck W Contrast  Result Date: 07/10/2021 CLINICAL DATA:  Ten-day history of enlarged right tonsil, firm EXAM: CT NECK WITH CONTRAST TECHNIQUE: Multidetector CT imaging of the neck was performed using the standard protocol following the bolus administration of intravenous contrast. RADIATION DOSE REDUCTION: This exam was performed according to the departmental dose-optimization program which includes automated exposure control, adjustment of the mA and/or kV according to patient size and/or use of iterative reconstruction technique. CONTRAST:  79m OMNIPAQUE IOHEXOL 300 MG/ML  SOLN COMPARISON:  Cervical spine MRI 07/15/2020, maxillofacial CT 12/19/2003 FINDINGS: Pharynx and larynx: The nasal cavity and nasopharynx are unremarkable. The oral cavity and oropharynx are unremarkable. The parapharyngeal spaces are clear. The tonsils are unremarkable in appearance. The hypopharynx and larynx are unremarkable. The vocal folds are normal. There is no abnormal fluid collection or enhancing mass lesion. Salivary glands: The parotid and submandibular glands are unremarkable. Thyroid: Unremarkable. Lymph nodes: There is no pathologic lymphadenopathy in the neck. Vascular: Unremarkable. Limited intracranial: Imaged portions of the intracranial compartment are unremarkable. Visualized orbits: The partially imaged globes and orbits are unremarkable. Mastoids and visualized paranasal sinuses: The imaged paranasal sinuses are clear. The imaged mastoid air cells are clear. Skeleton: Postsurgical changes are noted in the mandible with presumed  synthetic graft material along the mandibular bodies. The graft material is fractured anteriorly just to the right of midline (6-2, 3-55). Postsurgical changes are also noted along the anterior maxillary sinus walls bilaterally. Otherwise, there is no acute osseous abnormality or aggressive osseous lesion. Upper chest: Imaged lung apices are clear. Other: There is no abnormality at the indicated site of tenderness the right neck (3-69). There is mild fatty atrophy of the masseter muscles bilaterally. IMPRESSION: 1. No abnormality at the indicated site of tenderness in the right neck. No abnormal soft tissue mass, fluid collection, or lymph node. 2. Unremarkable appearance of the tonsils. 3. Synthetic graft material along the mandibular bodies which is fractured anteriorly just to the right  of midline. Electronically Signed   By: Valetta Mole M.D.   On: 07/10/2021 11:20    Assessment & Plan:   Derek was seen today for hypertension.  Diagnoses and all orders for this visit:  Vitamin B12 deficiency neuropathy ([place])- Will continue parenteral B12 replacement therapy. -     cyanocobalamin ((VITAMIN B-12)) injection 1,000 mcg  Essential hypertension- His blood pressure is well controlled.  Moderate episode of recurrent major depressive disorder ([place])- He is doing well on the current regimen.  We will continue.   I am having Derek C. Schoenberger "Beau" maintain his fish oil-omega-3 fatty acids, fluticasone, Spacer/Aero Chamber Mouthpiece, Ubrelvy, Flovent HFA, (Esketamine HCl (SPRAVATO, 56 MG DOSE, NA)), pramipexole, and albuterol. We administered cyanocobalamin.  Meds ordered this encounter  Medications   cyanocobalamin ((VITAMIN B-12)) injection 1,000 mcg     Follow-up: Return in about 6 months (around 05/28/2022).  Scarlette Calico, MD

## 2021-12-02 ENCOUNTER — Ambulatory Visit: Payer: Commercial Managed Care - HMO | Admitting: Internal Medicine

## 2021-12-28 ENCOUNTER — Other Ambulatory Visit: Payer: Self-pay | Admitting: Internal Medicine

## 2021-12-28 ENCOUNTER — Encounter: Payer: Self-pay | Admitting: Internal Medicine

## 2021-12-29 ENCOUNTER — Other Ambulatory Visit: Payer: Self-pay | Admitting: Internal Medicine

## 2021-12-29 DIAGNOSIS — F331 Major depressive disorder, recurrent, moderate: Secondary | ICD-10-CM

## 2021-12-29 MED ORDER — PRAMIPEXOLE DIHYDROCHLORIDE 1 MG PO TABS: 1.0000 mg | ORAL_TABLET | Freq: Every evening | ORAL | 1 refills | Status: DC

## 2022-01-13 ENCOUNTER — Ambulatory Visit (INDEPENDENT_AMBULATORY_CARE_PROVIDER_SITE_OTHER): Payer: Commercial Managed Care - HMO | Admitting: Internal Medicine

## 2022-01-13 ENCOUNTER — Encounter: Payer: Self-pay | Admitting: Internal Medicine

## 2022-01-13 VITALS — BP 114/68 | HR 82 | Temp 98.1°F | Ht 71.0 in | Wt 197.0 lb

## 2022-01-13 DIAGNOSIS — M25542 Pain in joints of left hand: Secondary | ICD-10-CM | POA: Diagnosis not present

## 2022-01-13 DIAGNOSIS — G8929 Other chronic pain: Secondary | ICD-10-CM | POA: Diagnosis not present

## 2022-01-13 DIAGNOSIS — M25541 Pain in joints of right hand: Secondary | ICD-10-CM | POA: Diagnosis not present

## 2022-01-13 DIAGNOSIS — Z23 Encounter for immunization: Secondary | ICD-10-CM

## 2022-01-13 DIAGNOSIS — Z1159 Encounter for screening for other viral diseases: Secondary | ICD-10-CM | POA: Insufficient documentation

## 2022-01-13 DIAGNOSIS — K219 Gastro-esophageal reflux disease without esophagitis: Secondary | ICD-10-CM | POA: Insufficient documentation

## 2022-01-13 DIAGNOSIS — M533 Sacrococcygeal disorders, not elsewhere classified: Secondary | ICD-10-CM

## 2022-01-13 DIAGNOSIS — Z91013 Allergy to seafood: Secondary | ICD-10-CM | POA: Insufficient documentation

## 2022-01-13 NOTE — Patient Instructions (Signed)
Joint Pain  Joint pain can be caused by many things. It is likely to go away if you follow instructions from your doctor for taking care of yourself at home. Sometimes, you may need more treatment. Follow these instructions at home: Managing pain, stiffness, and swelling     If told, put ice on the painful area. To do this: If you have a removable elastic bandage, sling, or splint, take it off as told by your doctor. Put ice in a plastic bag. Place a towel between your skin and the bag. Leave the ice on for 20 minutes, 2-3 times a day. Take off the ice if your skin turns bright red. This is very important. If you cannot feel pain, heat, or cold, you have a greater risk of damage to the area. Move your fingers or toes below the painful joint often. Raise the painful joint above the level of your heart while you are sitting or lying down. If told, put heat on the painful area. Do this as often as told by your doctor. Use the heat source that your doctor recommends, such as a moist heat pack or a heating pad. Place a towel between your skin and the heat source. Leave the heat on for 20-30 minutes. Take off the heat if your skin gets bright red. This is especially important if you are unable to feel pain, heat, or cold. You may have a greater risk of getting burned. Activity Rest the painful joint for as long as told by your doctor. Do not do things that cause pain or make your pain worse. Begin exercising or stretching the affected area, as told by your doctor. Ask your doctor what types of exercise are safe for you. Return to your normal activities when your doctor says that it is safe. If you have an elastic bandage, sling, or splint: Wear it as told by your doctor. Take it only as told by your doctor. Loosen it your fingers or toes below the joint: Tingle. Become numb. Get cold and blue. Keep it clean. Ask your doctor if you should take it off before bathing. If it is not  waterproof: Do not let it get wet. Cover it with a watertight covering when you take a bath or shower. General instructions Take over-the-counter and prescription medicines only as told by your doctor. This may include medicines taken by mouth or applied to the skin. Do not smoke or use any products that contain nicotine or tobacco. If you need help quitting, ask your doctor. Keep all follow-up visits as told by your doctor. This is important. Contact a doctor if: You have pain that gets worse and does not get better with medicine. Your joint pain does not get better in 3 days. You have more bruising or swelling. You have a fever. You lose 10 lb (4.5 kg) or more without trying. Get help right away if: You cannot move the joint. Your fingers or toes tingle, become numb. or get cold and blue. You have a fever along with a joint that is red, warm, and swollen. Summary Joint pain can be caused by many things. It often goes away if you follow instructions from your doctor for taking care of yourself at home. Rest the painful joint for as long as told. Do not do things that cause pain or make your pain worse. Take over-the-counter and prescription medicines only as told by your doctor. This information is not intended to replace advice given to you   by your health care provider. Make sure you discuss any questions you have with your health care provider. Document Revised: 09/13/2019 Document Reviewed: 09/13/2019 Elsevier Patient Education  2023 [employer]  

## 2022-01-13 NOTE — Progress Notes (Signed)
Subjective:  Patient ID: Derek Tucker, male    DOB: Aug 30, 1970  Age: 51 y.o. MRN: [id number]  CC: Follow-up   HPI Derek Tucker presents for f/up -  He complains of a 43-monthhistory of pain in the joints in his fingers.  He says he wakes up in the morning with stiff swollen joints and then as the day progresses the symptoms get better.  He is controlling the pain with Motrin and Tylenol.  He has an extensive family history of autoimmune disease.  He has also had left SI joint pain.  Outpatient Medications Prior to Visit  Medication Sig Dispense Refill   albuterol (VENTOLIN HFA) 108 (90 Base) MCG/ACT inhaler INHALE 2 PUFFS BY MOUTH EVERY 6 HOURS AS NEEDED FOR WHEEZING OR SHORTNESS OF BREATH 9 g 2   Esketamine HCl (SPRAVATO, 56 MG DOSE, NA) Place into the nose. Take 1 spray every 2 weeks. Administered in office.     fish oil-omega-3 fatty acids 1000 MG capsule Take 2 g by mouth daily.     fluticasone (FLONASE) 50 MCG/ACT nasal spray Place 4 sprays into the nose daily. 16 g 11   fluticasone (FLOVENT HFA) 110 MCG/ACT inhaler Inhale 1 puff into the lungs 2 times daily as needed. 12 g 5   pramipexole (MIRAPEX) 1 MG tablet Take 1 tablet (1 mg total) by mouth at bedtime. 90 tablet 1   Spacer/Aero Chamber Mouthpiece MISC 1 Act by Does not apply route 2 (two) times daily. 1 each 11   Ubrogepant (UBRELVY) 100 MG TABS Take 1 tablet by mouth daily as needed. 30 tablet 1   No facility-administered medications prior to visit.    ROS Review of Systems  Constitutional: Negative.  Negative for diaphoresis and fatigue.  HENT: Negative.    Eyes: Negative.   Respiratory:  Negative for cough, chest tightness, shortness of breath and wheezing.   Cardiovascular:  Negative for chest pain, palpitations and leg swelling.  Gastrointestinal:  Negative for abdominal pain, constipation, diarrhea, nausea and vomiting.  Endocrine: Negative.   Genitourinary: Negative.  Negative for difficulty  urinating and genital sores.  Musculoskeletal:  Positive for arthralgias, back pain and joint swelling. Negative for myalgias.  Skin: Negative.  Negative for color change and rash.  Neurological: Negative.  Negative for dizziness, weakness and light-headedness.  Hematological:  Negative for adenopathy. Does not bruise/bleed easily.  Psychiatric/Behavioral: Negative.      Objective:  BP 114/68 (BP Location: Left Arm, Patient Position: Sitting, Cuff Size: Large)   Pulse 82   Temp 98.1 F (36.7 C) (Oral)   Ht '5\' 11"'$  (1.803 m)   Wt 197 lb (89.4 kg)   SpO2 97%   BMI 27.48 kg/m   BP Readings from Last 3 Encounters:  01/13/22 114/68  11/26/21 132/82  10/31/21 104/76    Wt Readings from Last 3 Encounters:  01/13/22 197 lb (89.4 kg)  11/26/21 201 lb (91.2 kg)  10/31/21 198 lb 6.4 oz (90 kg)    Physical Exam Vitals reviewed.  HENT:     Mouth/Throat:     Mouth: Mucous membranes are moist.  Eyes:     General: No scleral icterus.    Pupils: Pupils are equal, round, and reactive to light.  Cardiovascular:     Rate and Rhythm: Normal rate and regular rhythm.     Heart sounds: No murmur heard. Pulmonary:     Effort: Pulmonary effort is normal.     Breath sounds: No stridor. No wheezing,  rhonchi or rales.  Abdominal:     General: Abdomen is flat.     Palpations: There is no mass.     Tenderness: There is no abdominal tenderness. There is no guarding.     Hernia: No hernia is present.  Musculoskeletal:        General: No swelling, tenderness or deformity. Normal range of motion.     Cervical back: Neck supple.     Right lower leg: No edema.     Left lower leg: No edema.  Lymphadenopathy:     Cervical: No cervical adenopathy.  Skin:    General: Skin is warm and dry.  Neurological:     General: No focal deficit present.     Mental Status: He is alert. Mental status is at baseline.  Psychiatric:        Mood and Affect: Mood normal.     Lab Results  Component Value Date    WBC 6.4 07/09/2021   HGB 15.0 07/09/2021   HCT 43.9 07/09/2021   PLT 295.0 07/09/2021   GLUCOSE 112 (H) 07/09/2021   CHOL 127 02/14/2021   TRIG 209.0 (H) 02/14/2021   HDL 38.60 (L) 02/14/2021   LDLDIRECT 71.0 02/14/2021   LDLCALC 64 11/29/2019   ALT 21 02/14/2021   AST 22 02/14/2021   NA 138 07/09/2021   K 3.9 07/09/2021   CL 101 07/09/2021   CREATININE 1.06 07/09/2021   BUN 7 07/09/2021   CO2 29 07/09/2021   TSH 2.46 02/14/2021   PSA 0.52 02/14/2021    CT Soft Tissue Neck W Contrast  Result Date: 07/10/2021 CLINICAL DATA:  Ten-day history of enlarged right tonsil, firm EXAM: CT NECK WITH CONTRAST TECHNIQUE: Multidetector CT imaging of the neck was performed using the standard protocol following the bolus administration of intravenous contrast. RADIATION DOSE REDUCTION: This exam was performed according to the departmental dose-optimization program which includes automated exposure control, adjustment of the mA and/or kV according to patient size and/or use of iterative reconstruction technique. CONTRAST:  65m OMNIPAQUE IOHEXOL 300 MG/ML  SOLN COMPARISON:  Cervical spine MRI 07/15/2020, maxillofacial CT 12/19/2003 FINDINGS: Pharynx and larynx: The nasal cavity and nasopharynx are unremarkable. The oral cavity and oropharynx are unremarkable. The parapharyngeal spaces are clear. The tonsils are unremarkable in appearance. The hypopharynx and larynx are unremarkable. The vocal folds are normal. There is no abnormal fluid collection or enhancing mass lesion. Salivary glands: The parotid and submandibular glands are unremarkable. Thyroid: Unremarkable. Lymph nodes: There is no pathologic lymphadenopathy in the neck. Vascular: Unremarkable. Limited intracranial: Imaged portions of the intracranial compartment are unremarkable. Visualized orbits: The partially imaged globes and orbits are unremarkable. Mastoids and visualized paranasal sinuses: The imaged paranasal sinuses are clear. The imaged  mastoid air cells are clear. Skeleton: Postsurgical changes are noted in the mandible with presumed synthetic graft material along the mandibular bodies. The graft material is fractured anteriorly just to the right of midline (6-2, 3-55). Postsurgical changes are also noted along the anterior maxillary sinus walls bilaterally. Otherwise, there is no acute osseous abnormality or aggressive osseous lesion. Upper chest: Imaged lung apices are clear. Other: There is no abnormality at the indicated site of tenderness the right neck (3-69). There is mild fatty atrophy of the masseter muscles bilaterally. IMPRESSION: 1. No abnormality at the indicated site of tenderness in the right neck. No abnormal soft tissue mass, fluid collection, or lymph node. 2. Unremarkable appearance of the tonsils. 3. Synthetic graft material along the mandibular bodies  which is fractured anteriorly just to the right of midline. Electronically Signed   By: Valetta Mole M.D.   On: 07/10/2021 11:20   DG Hand Complete Right  Result Date: 01/16/2022 CLINICAL DATA:  Bilateral hand and finger pain/stiffness in PIP joints mainly in the morning for 3 months. EXAM: RIGHT HAND - COMPLETE 3+ VIEW; LEFT HAND - COMPLETE 3+ VIEW COMPARISON:  None Available. FINDINGS: Right hand: Normal bone mineralization. Joint spaces are preserved. No acute fracture is seen. No dislocation. Left hand: Normal bone mineralization. Joint spaces are preserved. No acute fracture is seen. No dislocation. No cortical erosion or periostitis within either hand. IMPRESSION: Normal bilateral hand radiographs. Electronically Signed   By: Yvonne Kendall M.D.   On: 01/16/2022 11:05   DG Hand Complete Left  Result Date: 01/16/2022 CLINICAL DATA:  Bilateral hand and finger pain/stiffness in PIP joints mainly in the morning for 3 months. EXAM: RIGHT HAND - COMPLETE 3+ VIEW; LEFT HAND - COMPLETE 3+ VIEW COMPARISON:  None Available. FINDINGS: Right hand: Normal bone mineralization. Joint  spaces are preserved. No acute fracture is seen. No dislocation. Left hand: Normal bone mineralization. Joint spaces are preserved. No acute fracture is seen. No dislocation. No cortical erosion or periostitis within either hand. IMPRESSION: Normal bilateral hand radiographs. Electronically Signed   By: Yvonne Kendall M.D.   On: 01/16/2022 11:05   LONG TERM MONITOR-LIVE TELEMETRY (3-14 DAYS)  Result Date: 11/22/2021  Dominant rhythm is normal sinus, with normal circadian variation.  No atrial fibrillation or significant bradycardia is seen.  There are rare PVCs and a single 5-beat run of NSVT, relatively slow at 128 bpm, of doubtful clinical significance.  On one of the patient activated tracings, a PVC is seen. Most of the symptom-related recordings show normal rhythm  Normal event monitor. No clear correlation between symptoms and rare PVCs. Patch Wear Time:  13 days and 13 hours (2023-05-22T21:16:11-398 to 2023-06-05T10:46:35-0400) Patient had a min HR of 45 bpm, max HR of 155 bpm, and avg HR of 83 bpm. Predominant underlying rhythm was Sinus Rhythm. 1 run of Ventricular Tachycardia occurred lasting 5 beats with a max rate of 128 bpm (avg 105 bpm). Isolated SVEs were rare (<1.0%), and no SVE Couplets or SVE Triplets were present. Isolated VEs were rare (<1.0%, 2694), VE Triplets were rare (<1.0%, 1), and no VE Couplets were present.   ECHOCARDIOGRAM COMPLETE  Result Date: 11/17/2021    ECHOCARDIOGRAM REPORT   Patient Name:   Derek Tucker Date of Exam: 11/17/2021 Medical Rec #:  [id number]            Height:       71.0 in Accession #:    [id number]           Weight:       198.4 lb Date of Birth:  [birth year]             BSA:          2.101 m Patient Age:    34 years             BP:           104/76 mmHg Patient Gender: M                    HR:           71 bpm. Exam Location:  [facility] Procedure: 2D Echo, Cardiac Doppler and Color Doppler Indications:    R55 Syncope  History:  Patient has  no prior history of Echocardiogram examinations.                 Signs/Symptoms:Dizziness/Lightheadedness, Syncope, Chest Pain                 and Shortness of Breath; Risk Factors:Family History of Coronary                 Artery Disease and Former Smoker. Asthma.  Sonographer:    Deliah Boston RDCS Referring Phys: [facility] CROIT[facility]U IMPRESSIONS  1. Left ventricular ejection fraction, by estimation, is 60 to 65%. The left ventricle has normal function. The left ventricle has no regional wall motion abnormalities. Left ventricular diastolic function could not be evaluated. The average left ventricular global longitudinal strain is 23.8 %. The global longitudinal strain is normal.  2. Right ventricular systolic function is normal. The right ventricular size is normal. There is normal pulmonary artery systolic pressure. The estimated right ventricular systolic pressure is 47.4 mmHg.  3. The mitral valve is normal in structure. Trivial mitral valve regurgitation. No evidence of mitral stenosis.  4. The aortic valve is normal in structure. Aortic valve regurgitation is not visualized. No aortic stenosis is present.  5. The inferior vena cava is normal in size with greater than 50% respiratory variability, suggesting right atrial pressure of 3 mmHg. FINDINGS  Left Ventricle: Left ventricular ejection fraction, by estimation, is 60 to 65%. The left ventricle has normal function. The left ventricle has no regional wall motion abnormalities. The average left ventricular global longitudinal strain is 23.8 %. The  global longitudinal strain is normal. The left ventricular internal cavity size was normal in size. There is no left ventricular hypertrophy. Left ventricular diastolic function could not be evaluated. Right Ventricle: The right ventricular size is normal. No increase in right ventricular wall thickness. Right ventricular systolic function is normal. There is normal pulmonary artery systolic pressure. The tricuspid  regurgitant velocity is 2.36 m/s, and  with an assumed right atrial pressure of 3 mmHg, the estimated right ventricular systolic pressure is 25.9 mmHg. Left Atrium: Left atrial size was normal in size. Right Atrium: Right atrial size was normal in size. Pericardium: There is no evidence of pericardial effusion. Mitral Valve: The mitral valve is normal in structure. Trivial mitral valve regurgitation. No evidence of mitral valve stenosis. Tricuspid Valve: The tricuspid valve is normal in structure. Tricuspid valve regurgitation is trivial. No evidence of tricuspid stenosis. Aortic Valve: The aortic valve is normal in structure. Aortic valve regurgitation is not visualized. No aortic stenosis is present. Pulmonic Valve: The pulmonic valve was normal in structure. Pulmonic valve regurgitation is not visualized. No evidence of pulmonic stenosis. Aorta: The aortic root is normal in size and structure. Venous: The inferior vena cava is normal in size with greater than 50% respiratory variability, suggesting right atrial pressure of 3 mmHg. IAS/Shunts: No atrial level shunt detected by color flow Doppler.  LEFT VENTRICLE PLAX 2D LVIDd:         4.70 cm LVIDs:         2.80 cm   2D Longitudinal Strain LV PW:         0.90 cm   2D Strain GLS (A2C):   21.7 % LV IVS:        0.90 cm   2D Strain GLS (A3C):   23.0 % LVOT diam:     2.30 cm   2D Strain GLS (A4C):   26.6 % LV SV:  83        2D Strain GLS Avg:     23.8 % LV SV Index:   40 LVOT Area:     4.15 cm  RIGHT VENTRICLE RV Basal diam:  3.20 cm RV S prime:     10.20 cm/s TAPSE (M-mode): 1.7 cm LEFT ATRIUM             Index        RIGHT ATRIUM           Index LA diam:        3.70 cm 1.76 cm/m   RA Area:     19.80 cm LA Vol (A2C):   31.3 ml 14.89 ml/m  RA Volume:   59.00 ml  28.08 ml/m LA Vol (A4C):   35.2 ml 16.75 ml/m LA Biplane Vol: 36.3 ml 17.27 ml/m  AORTIC VALVE LVOT Vmax:   98.26 cm/s LVOT Vmean:  61.820 cm/s LVOT VTI:    0.200 m  AORTA Ao Root diam: 3.30 cm Ao  Asc diam:  3.40 cm MITRAL VALVE               TRICUSPID VALVE MV Area (PHT): 3.17 cm    TR Peak grad:   22.3 mmHg MV Decel Time: 239 msec    TR Vmax:        236.00 cm/s MV E velocity: 75.55 cm/s MV A velocity: 63.80 cm/s  SHUNTS MV E/A ratio:  1.18        Systemic VTI:  0.20 m                            Systemic Diam: 2.30 cm Fransico Him MD Electronically signed by Fransico Him MD Signature Date/Time: 11/17/2021/4:34:36 PM    Final      Assessment & Plan:   Derek was seen today for follow-up.  Diagnoses and all orders for this visit:  Need for hepatitis C screening test -     Hepatitis C antibody; Future  Chronic left SI joint pain- Will evaluate for sacroiliitis. -     Cancel: DG Hip Unilat W OR W/O Pelvis Min 4 Views Left; Future -     B. burgdorfi antibodies by WB; Future -     Rheumatoid factor; Future -     Cyclic citrul peptide antibody, IgG; Future -     C-reactive protein; Future -     ANA; Future  Bilateral finger arthralgia- Exam and plain films are normal.  Will evaluate for inflammatory arthropathy. -     DG Hand Complete Right; Future -     DG Hand Complete Left; Future -     B. burgdorfi antibodies by WB; Future -     Rheumatoid factor; Future -     Cyclic citrul peptide antibody, IgG; Future -     C-reactive protein; Future -     ANA; Future  Other orders -     HPV 9-valent vaccine,Recombinat   I am having Derek C. Marcoe "Beau" maintain his fish oil-omega-3 fatty acids, fluticasone, Spacer/Aero Chamber Mouthpiece, Ubrelvy, Flovent HFA, (Esketamine HCl (SPRAVATO, 56 MG DOSE, NA)), albuterol, and pramipexole.  No orders of the defined types were placed in this encounter.    Follow-up: Return in about 3 months (around 04/15/2022).  Scarlette Calico, MD

## 2022-01-16 ENCOUNTER — Other Ambulatory Visit (INDEPENDENT_AMBULATORY_CARE_PROVIDER_SITE_OTHER): Payer: Commercial Managed Care - HMO

## 2022-01-16 ENCOUNTER — Ambulatory Visit (INDEPENDENT_AMBULATORY_CARE_PROVIDER_SITE_OTHER)
Admission: RE | Admit: 2022-01-16 | Discharge: 2022-01-16 | Disposition: A | Discharge status: Home / Self Care | Payer: Commercial Managed Care - HMO | Source: Ambulatory Visit | Attending: Internal Medicine | Admitting: Internal Medicine

## 2022-01-16 ENCOUNTER — Other Ambulatory Visit: Payer: Self-pay | Admitting: Internal Medicine

## 2022-01-16 DIAGNOSIS — M25541 Pain in joints of right hand: Secondary | ICD-10-CM | POA: Diagnosis not present

## 2022-01-16 DIAGNOSIS — M533 Sacrococcygeal disorders, not elsewhere classified: Secondary | ICD-10-CM | POA: Diagnosis not present

## 2022-01-16 DIAGNOSIS — M25542 Pain in joints of left hand: Secondary | ICD-10-CM

## 2022-01-16 DIAGNOSIS — G8929 Other chronic pain: Secondary | ICD-10-CM

## 2022-01-16 DIAGNOSIS — Z1159 Encounter for screening for other viral diseases: Secondary | ICD-10-CM

## 2022-01-16 LAB — C-REACTIVE PROTEIN: CRP: <1 mg/dL (ref 0.5–20.0)

## 2022-01-19 ENCOUNTER — Encounter: Payer: Self-pay | Admitting: Internal Medicine

## 2022-01-19 LAB — B. BURGDORFI ANTIBODIES BY WB
B burgdorferi IgG Abs (IB): NEGATIVE
B burgdorferi IgM Abs (IB): NEGATIVE
Lyme Disease 18 kD IgG: NONREACTIVE
Lyme Disease 23 kD IgG: NONREACTIVE
Lyme Disease 23 kD IgM: NONREACTIVE
Lyme Disease 28 kD IgG: NONREACTIVE
Lyme Disease 30 kD IgG: NONREACTIVE
Lyme Disease 39 kD IgG: NONREACTIVE
Lyme Disease 39 kD IgM: NONREACTIVE
Lyme Disease 41 kD IgG: REACTIVE — AB
Lyme Disease 41 kD IgM: REACTIVE — AB
Lyme Disease 45 kD IgG: NONREACTIVE
Lyme Disease 58 kD IgG: REACTIVE — AB
Lyme Disease 66 kD IgG: NONREACTIVE
Lyme Disease 93 kD IgG: NONREACTIVE

## 2022-01-19 LAB — RHEUMATOID FACTOR: Rheumatoid fact SerPl-aCnc: <14 IU/mL (ref <14)

## 2022-01-19 LAB — CYCLIC CITRUL PEPTIDE ANTIBODY, IGG: Cyclic Citrullin Peptide Ab: <16 UNITS

## 2022-01-19 LAB — HEPATITIS C ANTIBODY: Hepatitis C Ab: NONREACTIVE

## 2022-01-19 LAB — ANA: Anti Nuclear Antibody (ANA): NEGATIVE

## 2022-03-06 ENCOUNTER — Ambulatory Visit (INDEPENDENT_AMBULATORY_CARE_PROVIDER_SITE_OTHER): Payer: Commercial Managed Care - HMO | Admitting: *Deleted

## 2022-03-06 DIAGNOSIS — G63 Polyneuropathy in diseases classified elsewhere: Secondary | ICD-10-CM

## 2022-03-06 DIAGNOSIS — E538 Deficiency of other specified B group vitamins: Secondary | ICD-10-CM

## 2022-03-06 MED ORDER — CYANOCOBALAMIN 1000 MCG/ML IJ SOLN
1000.0000 ug | Freq: Once | INTRAMUSCULAR | Status: AC
  Administered 2022-03-06: 1000 ug via INTRAMUSCULAR

## 2022-03-06 NOTE — Progress Notes (Signed)
Pls cosign for B12 inj../lmb  

## 2022-03-18 ENCOUNTER — Ambulatory Visit (INDEPENDENT_AMBULATORY_CARE_PROVIDER_SITE_OTHER): Payer: Commercial Managed Care - HMO

## 2022-03-18 ENCOUNTER — Ambulatory Visit (INDEPENDENT_AMBULATORY_CARE_PROVIDER_SITE_OTHER): Payer: Commercial Managed Care - HMO | Admitting: Family Medicine

## 2022-03-18 ENCOUNTER — Encounter: Payer: Self-pay | Admitting: Family Medicine

## 2022-03-18 VITALS — BP 118/82 | HR 79 | Temp 97.6°F | Ht 71.0 in | Wt 197.0 lb

## 2022-03-18 DIAGNOSIS — S99921A Unspecified injury of right foot, initial encounter: Secondary | ICD-10-CM

## 2022-03-18 NOTE — Progress Notes (Signed)
Subjective:     Patient ID: Derek Tucker, male    DOB: 10/13/1970, 51 y.o.   MRN: [id number]  Chief Complaint  Patient presents with   Ankle Pain    [place] down a hill 3 days ago, right foot pain, swelling has gone down and is not as painful today.    HPI Patient is in today for right foot pain and swelling x 3 days.   States he was walking down a steep hill behind his house and tripped injuring the lateral part of his right foot. Denies LOC, head, neck or back pain. No syncope, dizziness, or chest pain causing fall.   Pain and swelling worse with ambulation. No ankle pain.   No fever, chills, N/V. No numbness or tingling.   There are no preventive care reminders to display for this patient.  Past Medical History:  Diagnosis Date   Allergy    Arthritis    Asthma     Past Surgical History:  Procedure Laterality Date   APPENDECTOMY  06/15/1980   KNEE ARTHROSCOPY  06/15/2010   TONSILLECTOMY  08/28/2021    Family History  Problem Relation Age of Onset   Arthritis Mother    Hypertension Mother    Arthritis/Rheumatoid Mother    Depression Father    Heart disease Father    Hypertension Father    Psoriasis Sister    Lupus Sister    Depression Brother    Asthma Neg Hx    Alcohol abuse Neg Hx    Cancer Neg Hx    COPD Neg Hx    Diabetes Neg Hx    Drug abuse Neg Hx    Hyperlipidemia Neg Hx    Kidney disease Neg Hx    Stroke Neg Hx     Social History   Socioeconomic History   Marital status: Soil scientist    Spouse name: Not on file   Number of children: Not on file   Years of education: Not on file   Highest education level: Not on file  Occupational History   Not on file  Tobacco Use   Smoking status: Former   Smokeless tobacco: Never  Substance and Sexual Activity   Alcohol use: Yes    Alcohol/week: 3.0 standard drinks of alcohol    Types: 3 Glasses of wine per week   Drug use: Not on file   Sexual activity: Not Currently    Partners: Male   Other Topics Concern   Not on file  Social History Narrative   Not on file   Social Determinants of Health   Financial Resource Strain: Not on file  Food Insecurity: Not on file  Transportation Needs: Not on file  Physical Activity: Not on file  Stress: Not on file  Social Connections: Not on file  Intimate Partner Violence: Not on file    Outpatient Medications Prior to Visit  Medication Sig Dispense Refill   albuterol (VENTOLIN HFA) 108 (90 Base) MCG/ACT inhaler INHALE 2 PUFFS BY MOUTH EVERY 6 HOURS AS NEEDED FOR WHEEZING OR SHORTNESS OF BREATH 9 g 2   Esketamine HCl (SPRAVATO, 56 MG DOSE, NA) Place into the nose. Take 1 spray every 2 weeks. Administered in office.     fish oil-omega-3 fatty acids 1000 MG capsule Take 2 g by mouth daily.     fluticasone (FLONASE) 50 MCG/ACT nasal spray Place 4 sprays into the nose daily. 16 g 11   fluticasone (FLOVENT HFA) 110 MCG/ACT inhaler Inhale 1  puff into the lungs 2 times daily as needed. 12 g 5   pramipexole (MIRAPEX) 1 MG tablet Take 1 tablet (1 mg total) by mouth at bedtime. 90 tablet 1   Spacer/Aero Chamber Mouthpiece MISC 1 Act by Does not apply route 2 (two) times daily. 1 each 11   Ubrogepant (UBRELVY) 100 MG TABS Take 1 tablet by mouth daily as needed. 30 tablet 1   No facility-administered medications prior to visit.    Allergies  Allergen Reactions   Dulera [Mometasone Furo-Formoterol Fum]     headache   Imitrex [Sumatriptan] Other (See Comments)    Chest pain and numbness   Shellfish Allergy Other (See Comments)    ROS     Objective:    Physical Exam Constitutional:      General: He is not in acute distress.    Appearance: He is not ill-appearing.  Cardiovascular:     Rate and Rhythm: Normal rate.  Pulmonary:     Effort: Pulmonary effort is normal.  Musculoskeletal:     Right lower leg: Normal.     Left lower leg: Normal.     Right ankle: Normal.     Left ankle: Normal.     Right foot: Decreased range of  motion. Normal capillary refill. Swelling and tenderness present. Normal pulse.     Comments: Right dorsal mid foot swelling and TTP. RLE is neurovascularly intact.   Skin:    General: Skin is warm and dry.  Neurological:     General: No focal deficit present.     Mental Status: He is alert and oriented to person, place, and time.     Sensory: No sensory deficit.     Motor: No weakness.     Gait: Gait abnormal.  Psychiatric:        Mood and Affect: Mood normal.        Behavior: Behavior normal.     BP 118/82 (BP Location: Left Arm, Patient Position: Sitting, Cuff Size: Large)   Pulse 79   Temp 97.6 F (36.4 C) (Temporal)   Ht '5\' 11"'$  (1.803 m)   Wt 197 lb (89.4 kg)   SpO2 99%   BMI 27.48 kg/m  Wt Readings from Last 3 Encounters:  03/18/22 197 lb (89.4 kg)  01/13/22 197 lb (89.4 kg)  11/26/21 201 lb (91.2 kg)       Assessment & Plan:   Problem List Items Addressed This Visit   None Visit Diagnoses     Injury of right foot, initial encounter    -  Primary   Relevant Orders   DG Foot Complete Right (Completed)      Neurovascularly intact right foot. Pain and swelling due to mechanical fall. Antalgic gait. X ray ordered. Counseling on RICE. Follow up pending X ray result or if he is not continuing to improve over the next few days. Consider referral to sports medicine or orthopedist if needed.   I am having Derek C. Doering "Beau" maintain his fish oil-omega-3 fatty acids, fluticasone, Spacer/Aero Chamber Mouthpiece, Ubrelvy, Flovent HFA, (Esketamine HCl (SPRAVATO, 56 MG DOSE, NA)), albuterol, and pramipexole.  No orders of the defined types were placed in this encounter.

## 2022-03-18 NOTE — Patient Instructions (Signed)
Please go downstairs for an X ray of your right foot. We will be in touch.    RICE Therapy for Routine Care of Injuries The routine care of many injuries includes rest, ice, compression, and elevation (RICE therapy). RICE therapy is often recommended for injuries to soft tissues, such as muscle strain, sprains, bruises, and overuse injuries. It can also be used for some bone injuries. Using RICE therapy can help to relieve pain and lessen swelling. Supplies needed: Ice. Plastic bag. Towel. Elastic bandage. Pillow or pillows to raise (elevate) the injured body part. How to care for your injury with RICE therapy  Rest Rest your injury. This may help with the healing process. Rest usually involves limiting your normal activities and not using the injured part of your body. Generally, you can return to your normal activities when your health care provider says it is okay and you can do them without much discomfort. If you rest the injury too much, it may not heal as well. Some injuries heal better with early movement instead of resting for too long. Talk with your health care provider about how you should limit your activities and whether you should start range-of-motion exercises for your injury. Ice Ice your injury to lessen swelling and pain. Do not apply ice directly to your skin. Put ice in a plastic bag. Place a towel between your skin and the bag. Leave the ice on for 20 minutes, 2-3 times a day. Use ice on as many days as told by your health care provider. Compression Put pressure (compression) on your injured area to control swelling, give support, and help with discomfort. Compression may be done with an elastic bandage. If an elastic bandage has been applied, follow these general tips: Use the bandage as directed by the maker of the bandage that you are using. Do not wrap the bandage too tightly. That may block (cut off) circulation in the arm or leg in the area below the  bandage. If part of your body beyond the bandage becomes blue, numb, cold, swollen, or more painful, your bandage is probably too tight. If this occurs, remove your bandage and reapply it more loosely. Remove and reapply the bandage every 3-4 hours or as told by your health care provider. See your health care provider if the bandage seems to be making your problems worse rather than better. Elevation Elevate your injured area to lessen swelling and pain. If possible, elevate your injured area at or above the level of your heart or the center of your chest. Contact a health care provider if: Your pain and swelling continue. Your symptoms are getting worse rather than improving. Having these problems may mean that you need further evaluation or imaging tests, such as X-rays or an MRI. Sometimes, X-rays may not show a small broken bone (fracture) until days after the injury happened. Make a follow-up appointment with your health care provider. Ask your health care provider, or the department that is doing the imaging test, when your results will be ready. Get help right away if: You have sudden severe pain at or below the area of your injury. You have redness or increased swelling around your injury. You have tingling or numbness at or below the area of your injury and it does not improve after you remove the elastic bandage. Summary The routine care of many injuries includes rest, ice, compression, and elevation (RICE therapy). Using RICE therapy can help to relieve pain and lessen swelling. RICE therapy is  often recommended for injuries to soft tissues, such as muscle strain, sprains, bruises, and overuse injuries. It can also be used for some bone injuries. Seek medical care if your pain and swelling continue or if your symptoms are getting worse rather than improving. This information is not intended to replace advice given to you by your health care provider. Make sure you discuss any questions  you have with your health care provider. Document Revised: 08/07/2019 Document Reviewed: 02/19/2017 Elsevier Patient Education  [employer].

## 2022-04-21 ENCOUNTER — Encounter: Payer: Self-pay | Admitting: Internal Medicine

## 2022-04-23 ENCOUNTER — Ambulatory Visit (INDEPENDENT_AMBULATORY_CARE_PROVIDER_SITE_OTHER): Payer: Commercial Managed Care - HMO | Admitting: Internal Medicine

## 2022-04-23 ENCOUNTER — Encounter: Payer: Self-pay | Admitting: Internal Medicine

## 2022-04-23 ENCOUNTER — Encounter: Payer: Self-pay | Admitting: Gastroenterology

## 2022-04-23 VITALS — BP 126/74 | HR 73 | Temp 98.4°F | Ht 71.0 in | Wt 200.0 lb

## 2022-04-23 DIAGNOSIS — K222 Esophageal obstruction: Secondary | ICD-10-CM

## 2022-04-23 DIAGNOSIS — I10 Essential (primary) hypertension: Secondary | ICD-10-CM | POA: Diagnosis not present

## 2022-04-23 DIAGNOSIS — Z0000 Encounter for general adult medical examination without abnormal findings: Secondary | ICD-10-CM

## 2022-04-23 DIAGNOSIS — R3911 Hesitancy of micturition: Secondary | ICD-10-CM | POA: Insufficient documentation

## 2022-04-23 DIAGNOSIS — E538 Deficiency of other specified B group vitamins: Secondary | ICD-10-CM

## 2022-04-23 DIAGNOSIS — Z23 Encounter for immunization: Secondary | ICD-10-CM | POA: Diagnosis not present

## 2022-04-23 DIAGNOSIS — G63 Polyneuropathy in diseases classified elsewhere: Secondary | ICD-10-CM

## 2022-04-23 DIAGNOSIS — K2100 Gastro-esophageal reflux disease with esophagitis, without bleeding: Secondary | ICD-10-CM

## 2022-04-23 LAB — CBC WITH DIFFERENTIAL/PLATELET
Basophils Absolute: 0.1 10*3/uL (ref 0.0–0.1)
Basophils Relative: 1.2 % (ref 0.0–3.0)
Eosinophils Absolute: 0.3 10*3/uL (ref 0.0–0.7)
Eosinophils Relative: 4.8 % (ref 0.0–5.0)
HCT: 44.1 % (ref 39.0–52.0)
Hemoglobin: 15.2 g/dL (ref 13.0–17.0)
Lymphocytes Relative: 30.9 % (ref 12.0–46.0)
Lymphs Abs: 2.1 10*3/uL (ref 0.7–4.0)
MCHC: 34.5 g/dL (ref 30.0–36.0)
MCV: 87.7 fl (ref 78.0–100.0)
Monocytes Absolute: 0.6 10*3/uL (ref 0.1–1.0)
Monocytes Relative: 8.7 % (ref 3.0–12.0)
Neutro Abs: 3.7 10*3/uL (ref 1.4–7.7)
Neutrophils Relative %: 54.4 % (ref 43.0–77.0)
Platelets: 302 10*3/uL (ref 150.0–400.0)
RBC: 5.03 Mil/uL (ref 4.22–5.81)
RDW: 13.5 % (ref 11.5–15.5)
WBC: 6.8 10*3/uL (ref 4.0–10.5)

## 2022-04-23 LAB — URINALYSIS, ROUTINE W REFLEX MICROSCOPIC
Bilirubin Urine: NEGATIVE
Ketones, ur: NEGATIVE
Leukocytes,Ua: NEGATIVE
Nitrite: NEGATIVE
Specific Gravity, Urine: 1.01 (ref 1.000–1.030)
Total Protein, Urine: NEGATIVE
Urine Glucose: NEGATIVE
Urobilinogen, UA: 0.2 (ref 0.0–1.0)
pH: 6.5 (ref 5.0–8.0)

## 2022-04-23 LAB — HEPATIC FUNCTION PANEL
ALT: 30 U/L (ref 0–53)
AST: 25 U/L (ref 0–37)
Albumin: 4.9 g/dL (ref 3.5–5.2)
Alkaline Phosphatase: 55 U/L (ref 39–117)
Bilirubin, Direct: 0.2 mg/dL (ref 0.0–0.3)
Total Bilirubin: 1.1 mg/dL (ref 0.2–1.2)
Total Protein: 7.5 g/dL (ref 6.0–8.3)

## 2022-04-23 LAB — BASIC METABOLIC PANEL
BUN: 6 mg/dL (ref 6–23)
CO2: 28 mEq/L (ref 19–32)
Calcium: 9.6 mg/dL (ref 8.4–10.5)
Chloride: 101 mEq/L (ref 96–112)
Creatinine, Ser: 0.99 mg/dL (ref 0.40–1.50)
GFR: 86.86 mL/min (ref >60.00)
Glucose, Bld: 115 mg/dL — ABNORMAL HIGH (ref 70–99)
Potassium: 4.1 mEq/L (ref 3.5–5.1)
Sodium: 137 mEq/L (ref 135–145)

## 2022-04-23 LAB — FOLATE: Folate: 21 ng/mL (ref >5.9)

## 2022-04-23 LAB — VITAMIN B12: Vitamin B-12: 466 pg/mL (ref 211–911)

## 2022-04-23 LAB — PSA: PSA: 0.24 ng/mL (ref 0.10–4.00)

## 2022-04-23 NOTE — Progress Notes (Signed)
Subjective:  Patient ID: Derek Tucker, male    DOB: 1971-02-12  Age: 51 y.o. MRN: [id number]  CC: Annual Exam and Gastroesophageal Reflux   HPI Derek Tucker presents for a CPX and f/up -  He is active and denies DOE, CP, SOB, palpitations, edema, fatigue. He complains of dysphagia and says food gets stuck in his esophagus. He controls the heartburn with tums. He does not want to take a PPI or H2-blocker.  Outpatient Medications Prior to Visit  Medication Sig Dispense Refill   albuterol (VENTOLIN HFA) 108 (90 Base) MCG/ACT inhaler INHALE 2 PUFFS BY MOUTH EVERY 6 HOURS AS NEEDED FOR WHEEZING OR SHORTNESS OF BREATH 9 g 2   Esketamine HCl (SPRAVATO, 56 MG DOSE, NA) Place into the nose. Take 1 spray every 2 weeks. Administered in office.     fish oil-omega-3 fatty acids 1000 MG capsule Take 2 g by mouth daily.     fluticasone (FLONASE) 50 MCG/ACT nasal spray Place 4 sprays into the nose daily. 16 g 11   fluticasone (FLOVENT HFA) 110 MCG/ACT inhaler Inhale 1 puff into the lungs 2 times daily as needed. 12 g 5   pramipexole (MIRAPEX) 1 MG tablet Take 1 tablet (1 mg total) by mouth at bedtime. 90 tablet 1   Spacer/Aero Chamber Mouthpiece MISC 1 Act by Does not apply route 2 (two) times daily. 1 each 11   Ubrogepant (UBRELVY) 100 MG TABS Take 1 tablet by mouth daily as needed. 30 tablet 1   No facility-administered medications prior to visit.    ROS Review of Systems  Constitutional:  Positive for unexpected weight change (wt gain). Negative for appetite change, chills, diaphoresis and fatigue.  HENT:  Positive for trouble swallowing. Negative for sore throat.   Respiratory:  Negative for cough, chest tightness, shortness of breath and wheezing.   Cardiovascular:  Negative for chest pain, palpitations and leg swelling.  Gastrointestinal: Negative.  Negative for abdominal pain, anal bleeding, constipation, diarrhea, nausea and vomiting.  Genitourinary:  Positive for difficulty  urinating. Negative for dysuria, genital sores, hematuria, penile swelling and scrotal swelling.  Musculoskeletal: Negative.   Skin: Negative.   Neurological: Negative.   Hematological:  Negative for adenopathy. Does not bruise/bleed easily.  Psychiatric/Behavioral: Negative.      Objective:  BP 126/74 (BP Location: Left Arm, Patient Position: Sitting, Cuff Size: Large)   Pulse 73   Temp 98.4 F (36.9 C) (Oral)   Ht '5\' 11"'$  (1.803 m)   Wt 200 lb (90.7 kg)   SpO2 97%   BMI 27.89 kg/m   BP Readings from Last 3 Encounters:  04/23/22 126/74  03/18/22 118/82  01/13/22 114/68    Wt Readings from Last 3 Encounters:  04/23/22 200 lb (90.7 kg)  03/18/22 197 lb (89.4 kg)  01/13/22 197 lb (89.4 kg)    Physical Exam Vitals reviewed.  HENT:     Nose: Nose normal.     Mouth/Throat:     Mouth: Mucous membranes are moist.  Eyes:     General: No scleral icterus.    Conjunctiva/sclera: Conjunctivae normal.  Cardiovascular:     Rate and Rhythm: Normal rate and regular rhythm.     Heart sounds: No murmur heard. Pulmonary:     Effort: Pulmonary effort is normal.     Breath sounds: No stridor. No wheezing, rhonchi or rales.  Abdominal:     General: Abdomen is flat.     Palpations: There is no mass.  Tenderness: There is no abdominal tenderness. There is no guarding.     Hernia: No hernia is present.  Genitourinary:    Comments: GU/rectal exam deferred at this request Musculoskeletal:        General: Normal range of motion.     Cervical back: Neck supple.  Lymphadenopathy:     Cervical: No cervical adenopathy.  Skin:    General: Skin is warm and dry.  Neurological:     General: No focal deficit present.     Mental Status: He is alert.  Psychiatric:        Mood and Affect: Mood normal.        Behavior: Behavior normal.     Lab Results  Component Value Date   WBC 6.8 04/23/2022   HGB 15.2 04/23/2022   HCT 44.1 04/23/2022   PLT 302.0 04/23/2022   GLUCOSE 115 (H)  04/23/2022   CHOL 127 02/14/2021   TRIG 209.0 (H) 02/14/2021   HDL 38.60 (L) 02/14/2021   LDLDIRECT 71.0 02/14/2021   LDLCALC 64 11/29/2019   ALT 30 04/23/2022   AST 25 04/23/2022   NA 137 04/23/2022   K 4.1 04/23/2022   CL 101 04/23/2022   CREATININE 0.99 04/23/2022   BUN 6 04/23/2022   CO2 28 04/23/2022   TSH 2.46 02/14/2021   PSA 0.24 04/23/2022    DG Hand Complete Right  Result Date: 01/16/2022 CLINICAL DATA:  Bilateral hand and finger pain/stiffness in PIP joints mainly in the morning for 3 months. EXAM: RIGHT HAND - COMPLETE 3+ VIEW; LEFT HAND - COMPLETE 3+ VIEW COMPARISON:  None Available. FINDINGS: Right hand: Normal bone mineralization. Joint spaces are preserved. No acute fracture is seen. No dislocation. Left hand: Normal bone mineralization. Joint spaces are preserved. No acute fracture is seen. No dislocation. No cortical erosion or periostitis within either hand. IMPRESSION: Normal bilateral hand radiographs. Electronically Signed   By: Yvonne Kendall M.D.   On: 01/16/2022 11:05   DG Hand Complete Left  Result Date: 01/16/2022 CLINICAL DATA:  Bilateral hand and finger pain/stiffness in PIP joints mainly in the morning for 3 months. EXAM: RIGHT HAND - COMPLETE 3+ VIEW; LEFT HAND - COMPLETE 3+ VIEW COMPARISON:  None Available. FINDINGS: Right hand: Normal bone mineralization. Joint spaces are preserved. No acute fracture is seen. No dislocation. Left hand: Normal bone mineralization. Joint spaces are preserved. No acute fracture is seen. No dislocation. No cortical erosion or periostitis within either hand. IMPRESSION: Normal bilateral hand radiographs. Electronically Signed   By: Yvonne Kendall M.D.   On: 01/16/2022 11:05    Assessment & Plan:   Derek was seen today for annual exam and gastroesophageal reflux.  Diagnoses and all orders for this visit:  Essential hypertension- His BP is well controlled. -     Basic metabolic panel; Future -     CBC with  Differential/Platelet; Future -     Urinalysis, Routine w reflex microscopic; Future -     Hepatic function panel; Future -     Urinalysis, Routine w reflex microscopic -     CBC with Differential/Platelet -     Basic metabolic panel -     Hepatic function panel  Vitamin B12 deficiency neuropathy (HCC)-B12 is normal now. -     Vitamin B12; Future -     CBC with Differential/Platelet; Future -     Folate; Future -     Folate -     CBC with Differential/Platelet -  Vitamin B12  Routine general medical examination at a health care facility- Exam completed, labs reviewed - statin is not indicated., vaccines reviewed and updated, cancer screenings up-to-date, patient education was given. -     PSA; Future -     PSA  Urinary hesitancy- Labs are reassuring. -     Urinalysis, Routine w reflex microscopic; Future -     PSA; Future -     PSA -     Urinalysis, Routine w reflex microscopic  Esophageal stricture -     Ambulatory referral to Gastroenterology  Gastroesophageal reflux disease with esophagitis without hemorrhage -     Ambulatory referral to Gastroenterology  Other orders -     Flu Vaccine QUAD 6+ mos PF IM (Fluarix Quad PF)   I am having Derek C. Vonstein "Beau" maintain his fish oil-omega-3 fatty acids, fluticasone, Spacer/Aero Chamber Mouthpiece, Ubrelvy, Flovent HFA, (Esketamine HCl (SPRAVATO, 56 MG DOSE, NA)), albuterol, and pramipexole.  No orders of the defined types were placed in this encounter.    Follow-up: Return in about 6 months (around 10/22/2022).  Scarlette Calico, MD

## 2022-04-23 NOTE — Patient Instructions (Signed)
Health Maintenance, Male Adopting a healthy lifestyle and getting preventive care are important in promoting health and wellness. Ask your health care provider about: The right schedule for you to have regular tests and exams. Things you can do on your own to prevent diseases and keep yourself healthy. What should I know about diet, weight, and exercise? Eat a healthy diet  Eat a diet that includes plenty of vegetables, fruits, low-fat dairy products, and lean protein. Do not eat a lot of foods that are high in solid fats, added sugars, or sodium. Maintain a healthy weight Body mass index (BMI) is a measurement that can be used to identify possible weight problems. It estimates body fat based on height and weight. Your health care provider can help determine your BMI and help you achieve or maintain a healthy weight. Get regular exercise Get regular exercise. This is one of the most important things you can do for your health. Most adults should: Exercise for at least 150 minutes each week. The exercise should increase your heart rate and make you sweat (moderate-intensity exercise). Do strengthening exercises at least twice a week. This is in addition to the moderate-intensity exercise. Spend less time sitting. Even light physical activity can be beneficial. Watch cholesterol and blood lipids Have your blood tested for lipids and cholesterol at 53 years of age, then have this test every 5 years. You may need to have your cholesterol levels checked more often if: Your lipid or cholesterol levels are high. You are older than 53 years of age. You are at high risk for heart disease. What should I know about cancer screening? Many types of cancers can be detected early and may often be prevented. Depending on your health history and family history, you may need to have cancer screening at various ages. This may include screening for: Colorectal cancer. Prostate cancer. Skin cancer. Lung  cancer. What should I know about heart disease, diabetes, and high blood pressure? Blood pressure and heart disease High blood pressure causes heart disease and increases the risk of stroke. This is more likely to develop in people who have high blood pressure readings or are overweight. Talk with your health care provider about your target blood pressure readings. Have your blood pressure checked: Every 3-5 years if you are 18-39 years of age. Every year if you are 40 years old or older. If you are between the ages of 65 and 75 and are a current or former smoker, ask your health care provider if you should have a one-time screening for abdominal aortic aneurysm (AAA). Diabetes Have regular diabetes screenings. This checks your fasting blood sugar level. Have the screening done: Once every three years after age 45 if you are at a normal weight and have a low risk for diabetes. More often and at a younger age if you are overweight or have a high risk for diabetes. What should I know about preventing infection? Hepatitis B If you have a higher risk for hepatitis B, you should be screened for this virus. Talk with your health care provider to find out if you are at risk for hepatitis B infection. Hepatitis C Blood testing is recommended for: Everyone born from 1945 through 1965. Anyone with known risk factors for hepatitis C. Sexually transmitted infections (STIs) You should be screened each year for STIs, including gonorrhea and chlamydia, if: You are sexually active and are younger than 53 years of age. You are older than 53 years of age and your   health care provider tells you that you are at risk for this type of infection. Your sexual activity has changed since you were last screened, and you are at increased risk for chlamydia or gonorrhea. Ask your health care provider if you are at risk. Ask your health care provider about whether you are at high risk for HIV. Your health care provider  may recommend a prescription medicine to help prevent HIV infection. If you choose to take medicine to prevent HIV, you should first get tested for HIV. You should then be tested every 3 months for as long as you are taking the medicine. Follow these instructions at home: Alcohol use Do not drink alcohol if your health care provider tells you not to drink. If you drink alcohol: Limit how much you have to 0-2 drinks a day. Know how much alcohol is in your drink. In the [place] one drink equals one 12 oz bottle of beer (355 mL), one 5 oz glass of wine (148 mL), or one 1 oz glass of hard liquor (44 mL). Lifestyle Do not use any products that contain nicotine or tobacco. These products include cigarettes, chewing tobacco, and vaping devices, such as e-cigarettes. If you need help quitting, ask your health care provider. Do not use street drugs. Do not share needles. Ask your health care provider for help if you need support or information about quitting drugs. General instructions Schedule regular health, dental, and eye exams. Stay current with your vaccines. Tell your health care provider if: You often feel depressed. You have ever been abused or do not feel safe at home. Summary Adopting a healthy lifestyle and getting preventive care are important in promoting health and wellness. Follow your health care provider's instructions about healthy diet, exercising, and getting tested or screened for diseases. Follow your health care provider's instructions on monitoring your cholesterol and blood pressure. This information is not intended to replace advice given to you by your health care provider. Make sure you discuss any questions you have with your health care provider. Document Revised: 10/21/2020 Document Reviewed: 10/21/2020 Elsevier Patient Education  2023 [employer]  

## 2022-04-24 ENCOUNTER — Encounter: Payer: Self-pay | Admitting: Internal Medicine

## 2022-04-27 MED ORDER — "BD SYRINGE/NEEDLE 23G X 1"" 3 ML MISC": 0 refills | Status: DC

## 2022-04-27 MED ORDER — CYANOCOBALAMIN 1000 MCG/ML IJ SOLN: 1000.0000 ug | INTRAMUSCULAR | 5 refills | Status: DC

## 2022-05-28 ENCOUNTER — Ambulatory Visit (INDEPENDENT_AMBULATORY_CARE_PROVIDER_SITE_OTHER): Payer: Commercial Managed Care - HMO | Admitting: Gastroenterology

## 2022-05-28 ENCOUNTER — Encounter: Payer: Self-pay | Admitting: Gastroenterology

## 2022-05-28 VITALS — BP 126/78 | HR 74 | Ht 71.0 in | Wt 198.1 lb

## 2022-05-28 DIAGNOSIS — Z8719 Personal history of other diseases of the digestive system: Secondary | ICD-10-CM | POA: Diagnosis not present

## 2022-05-28 DIAGNOSIS — R1310 Dysphagia, unspecified: Secondary | ICD-10-CM | POA: Diagnosis not present

## 2022-05-28 NOTE — Progress Notes (Addendum)
05/28/2022 Derek Tucker [id number] 1970/10/14   HISTORY OF PRESENT ILLNESS: This is a 51 year old male who is new to our office.  Looks like his care was assumed by Dr. Havery Moros.  He was previously a patient of Dr. Ulyses Amor.  He is here today with complaints of dysphagia and epigastric abdominal pain.  He had EGD in 2021 as below with esophageal dilation and has had them even prior to that.  He tells me that he feels like food gets stuck in the lower part of the esophagus before it enters the stomach.  Says that he tries to drink water and sometimes takes 10 to 15 minutes before will finally go down.  He does have some epigastric abdominal discomfort.  It does not sound like he has taken anything regularly for his acid reflux, but maybe has tried some H2 blockers in the past.  He is worried about potential side effects of PPIs.  EGD March 2021 by Dr. Benson Norway showed benign-appearing esophageal stenosis and grade D esophagitis.  Passes of the scope dilation of the esophagus.   Referred here by his PCP, Dr. Ronnald Ramp, for esophageal stricture.  Past Medical History:  Diagnosis Date   Allergy    Arthritis    Asthma    Depression    History of gallstones    Past Surgical History:  Procedure Laterality Date   APPENDECTOMY  06/15/1980   CHOLECYSTECTOMY  2017   HERNIA REPAIR N/A [id number]   Umbilical   KNEE ARTHROSCOPY  06/15/2010   TONSILLECTOMY  08/28/2021    reports that he has quit smoking. He has never used smokeless tobacco. He reports current alcohol use of about 3.0 standard drinks of alcohol per week. He reports that he does not currently use drugs. family history includes Arthritis in his mother; Arthritis/Rheumatoid in his mother; Depression in his brother and father; Heart disease in his father; Hypertension in his father and mother; Lupus in his sister; Psoriasis in his sister. Allergies  Allergen Reactions   Dulera [Mometasone Furo-Formoterol Fum]     headache   Imitrex  [Sumatriptan] Other (See Comments)    Chest pain and numbness   Shellfish Allergy Other (See Comments)      Outpatient Encounter Medications as of 05/28/2022  Medication Sig   albuterol (VENTOLIN HFA) 108 (90 Base) MCG/ACT inhaler INHALE 2 PUFFS BY MOUTH EVERY 6 HOURS AS NEEDED FOR WHEEZING OR SHORTNESS OF BREATH   cyanocobalamin (VITAMIN B12) 1000 MCG/ML injection Inject 1 mL (1,000 mcg total) into the muscle every 30 (thirty) days.   Esketamine HCl (SPRAVATO, 56 MG DOSE, NA) Place into the nose. Take 1 spray every 2 weeks. Administered in office.   fish oil-omega-3 fatty acids 1000 MG capsule Take 2 g by mouth daily.   fluticasone (FLONASE) 50 MCG/ACT nasal spray Place 4 sprays into the nose daily.   fluticasone (FLOVENT HFA) 110 MCG/ACT inhaler Inhale 1 puff into the lungs 2 times daily as needed.   pramipexole (MIRAPEX) 1 MG tablet Take 1 tablet (1 mg total) by mouth at bedtime.   Spacer/Aero Chamber Mouthpiece MISC 1 Act by Does not apply route 2 (two) times daily.   SYRINGE-NEEDLE, DISP, 3 ML (B-D SYRINGE/NEEDLE 3CC/23GX1") 23G X 1" 3 ML MISC Use to inject B12 injection monthly.   Ubrogepant (UBRELVY) 100 MG TABS Take 1 tablet by mouth daily as needed.   No facility-administered encounter medications on file as of 05/28/2022.    REVIEW OF SYSTEMS  : All  other systems reviewed and negative except where noted in the History of Present Illness.   PHYSICAL EXAM: BP 126/78   Pulse 74   Ht '5\' 11"'$  (1.803 m)   Wt 198 lb 2 oz (89.9 kg)   BMI 27.63 kg/m  General: Well developed male in no acute distress Head: Normocephalic and atraumatic Eyes:  Sclerae anicteric, conjunctiva pink. Ears: Normal auditory acuity  Lungs: Clear throughout to auscultation; no W/R/R. Heart: Regular rate and rhythm; no M/R/G. Abdomen: Soft, non-distended.  BS present.  Mild epigastric TTP. Musculoskeletal: Symmetrical with no gross deformities  Skin: No lesions on visible extremities Extremities: No  edema  Neurological: Alert oriented x 4, grossly non-focal Psychological:  Alert and cooperative. Normal mood and affect  ASSESSMENT AND PLAN: *Dysphagia and history of esophageal stricture as well as esophagitis: Patient had an EGD with dilation in 2021 by Dr. Benson Norway and prior to that as well.  He had grade D esophagitis, but says he is never really taken much for his esophagitis and reflux regularly.  Sounds like maybe he has tried some H2 blockers in the past.  He is hesitant to PPIs because of potential side effects.  Will plan for EGD with possible repeat dilation with Dr. Havery Moros as looks like Dr. Havery Moros agreed to assume his care.  We discussed GERD dietary changes and lifestyle measures.  We discussed PPI versus trying H2 blockers regularly 2 or 3 times a day.  The risks, benefits, and alternatives to EGD with dilation were discussed with the patient and he consents to proceed.    CC:  Janith Lima, MD

## 2022-05-28 NOTE — Patient Instructions (Signed)
You have been scheduled for an endoscopy. Please follow written instructions given to you at your visit today. If you use inhalers (even only as needed), please bring them with you on the day of your procedure.  _______________________________________________________  If you are age 53 or older, your body mass index should be between 23-30. Your Body mass index is 27.63 kg/m. If this is out of the aforementioned range listed, please consider follow up with your Primary Care Provider.  If you are age 68 or younger, your body mass index should be between 19-25. Your Body mass index is 27.63 kg/m. If this is out of the aformentioned range listed, please consider follow up with your Primary Care Provider.   ________________________________________________________  The [facility] providers would like to encourage you to use [facility] to communicate with providers for non-urgent requests or questions.  Due to long hold times on the telephone, sending your provider a message by [facility] may be a faster and more efficient way to get a response.  Please allow 48 business hours for a response.  Please remember that this is for non-urgent requests.  _______________________________________________________

## 2022-05-28 NOTE — Progress Notes (Signed)
Agree with EGD, but he should be on moderate dose PPI until then - recommend omeprazole '40mg'$  / day for at least a few weeks prior to EGD to treat reflux, hopefully heal esophagitis and rule out Barrett's

## 2022-06-04 ENCOUNTER — Encounter: Payer: Self-pay | Admitting: Internal Medicine

## 2022-06-04 ENCOUNTER — Encounter: Payer: Self-pay | Admitting: Gastroenterology

## 2022-06-04 ENCOUNTER — Ambulatory Visit (AMBULATORY_SURGERY_CENTER): Payer: Commercial Managed Care - HMO | Admitting: Gastroenterology

## 2022-06-04 VITALS — BP 123/86 | HR 74 | Temp 98.1°F | Resp 19 | Ht 71.0 in | Wt 198.0 lb

## 2022-06-04 DIAGNOSIS — R1310 Dysphagia, unspecified: Secondary | ICD-10-CM | POA: Diagnosis not present

## 2022-06-04 DIAGNOSIS — K2100 Gastro-esophageal reflux disease with esophagitis, without bleeding: Secondary | ICD-10-CM | POA: Diagnosis not present

## 2022-06-04 MED ORDER — SODIUM CHLORIDE 0.9 % IV SOLN: 500.0000 mL | INTRAVENOUS | Status: DC

## 2022-06-04 MED ORDER — OMEPRAZOLE 40 MG PO CPDR: 40.0000 mg | DELAYED_RELEASE_CAPSULE | Freq: Every day | ORAL | 3 refills | Status: DC

## 2022-06-04 NOTE — Op Note (Signed)
[facility] Patient Name: Derek Tucker Procedure Date: 06/04/2022 1:23 PM MRN: [id number] Endoscopist: Remo Lipps P. Havery Moros , MD, [id number] Age: 51 Referring MD:  Date of Birth: 1970-07-08 Gender: Male Account #: [id number] Procedure:                Upper GI endoscopy Indications:              Dysphagia - history of distal esophageal stricture                            dilated with endoscope itself in the setting of LA                            grade D Esophagitis with Dr. Benson Norway in 2021 - not on                            any PPI currently Medicines:                Monitored Anesthesia Care Procedure:                Pre-Anesthesia Assessment:                           - Prior to the procedure, a History and Physical                            was performed, and patient medications and                            allergies were reviewed. The patient's tolerance of                            previous anesthesia was also reviewed. The risks                            and benefits of the procedure and the sedation                            options and risks were discussed with the patient.                            All questions were answered, and informed consent                            was obtained. Prior Anticoagulants: The patient has                            taken no anticoagulant or antiplatelet agents. ASA                            Grade Assessment: II - A patient with mild systemic                            disease. After reviewing the risks and benefits,  the patient was deemed in satisfactory condition to                            undergo the procedure.                           After obtaining informed consent, the endoscope was                            passed under direct vision. Throughout the                            procedure, the patient's blood pressure, pulse, and                            oxygen saturations were  monitored continuously. The                            Endoscope was introduced through the mouth, and                            advanced to the second part of duodenum. The upper                            GI endoscopy was accomplished without difficulty.                            The patient tolerated the procedure well. Scope In: Scope Out: Findings:                 Esophagogastric landmarks were identified: the                            Z-line was found at 40 cm, the gastroesophageal                            junction was found at 40 cm and the upper extent of                            the gastric folds was found at 40 cm from the                            incisors.                           LA Grade C esophagitis was found in the mid to                            distal esophagus, with ulceration at the GEJ                           One benign-appearing, intrinsic moderate to severe  stenosis was found at the GEJ. The upper endoscope                            passed with mild resistance, which dilated the                            stricture itself, causing appropriate mucosal                            wrents. No further dilation was performed given                            this finding and inflammatory changes.                           The exam of the esophagus was otherwise normal.                           The entire examined stomach was normal.                           The examined duodenum was normal. Complications:            No immediate complications. Estimated blood loss:                            Minimal. Estimated Blood Loss:     Estimated blood loss was minimal. Impression:               - Esophagogastric landmarks identified.                           - LA Grade C reflux esophagitis.                           - Benign-appearing esophageal stenosis - dilated                            with endoscope itself.                            - Normal stomach.                           - Normal examined duodenum.                           GERD is leading to erosive esophagitis and distal                            esophageal stricturing. At risk for Barrett's. Recommendation:           - Patient has a contact number available for                            emergencies. The signs and symptoms of potential  delayed complications were discussed with the                            patient. Return to normal activities tomorrow.                            Written discharge instructions were provided to the                            patient.                           - Post dilation diet                           - Continue present medications.                           - Start omeprazole '40mg'$  BID for 6 weeks, then once                            daily thereafter                           - Repeat EGD in 4 weeks, heal esophagitis on PPI,                            rule out Barrett's, further dilate stricture. Will                            discuss with the patient [name]. [name], MD 06/04/2022 1:44:11 PM This report has been signed electronically.

## 2022-06-04 NOTE — Progress Notes (Signed)
VSS, transported to PACU °

## 2022-06-04 NOTE — Progress Notes (Signed)
[facility] History and Physical   Primary Care Physician:  Janith Lima, MD   Reason for Procedure:   dysphagia  Plan:    EGD     HPI: Derek Tucker is a 51 y.o. male  here for EGD to evaluate and treat dysphagia, reported history of distal esophageal stricture. Seen on 05/28/22 - no interval changes. Does have some GERD he does not take anything for it routinely   I have discussed risks / benefits of anesthesia and endoscopic procedure with Derek C Dewberry and they wish to proceed with the exams as outlined today.    Past Medical History:  Diagnosis Date   Allergy    Arthritis    Asthma    Depression    History of gallstones     Past Surgical History:  Procedure Laterality Date   APPENDECTOMY  06/15/1980   CHOLECYSTECTOMY  2017   HERNIA REPAIR N/A [id number]   Umbilical   KNEE ARTHROSCOPY  06/15/2010   TONSILLECTOMY  08/28/2021    Prior to Admission medications   Medication Sig Start Date End Date Taking? Authorizing Provider  albuterol (VENTOLIN HFA) 108 (90 Base) MCG/ACT inhaler INHALE 2 PUFFS BY MOUTH EVERY 6 HOURS AS NEEDED FOR WHEEZING OR SHORTNESS OF BREATH 07/16/21  Yes Janith Lima, MD  Esketamine HCl (SPRAVATO, 56 MG DOSE, NA) Place into the nose. Take 1 spray every 2 weeks. Administered in office.   Yes [provider]  fluticasone (FLONASE) 50 MCG/ACT nasal spray Place 4 sprays into the nose daily. 04/06/13  Yes Janith Lima, MD  fluticasone (FLOVENT HFA) 110 MCG/ACT inhaler Inhale 1 puff into the lungs 2 times daily as needed. 09/26/20  Yes Janith Lima, MD  pramipexole (MIRAPEX) 1 MG tablet Take 1 tablet (1 mg total) by mouth at bedtime. 12/29/21  Yes Janith Lima, MD  cyanocobalamin (VITAMIN B12) 1000 MCG/ML injection Inject 1 mL (1,000 mcg total) into the muscle every 30 (thirty) days. 04/27/22   Janith Lima, MD  fish oil-omega-3 fatty acids 1000 MG capsule Take 2 g by mouth daily.    [provider]   Spacer/Aero Chamber Mouthpiece MISC 1 Act by Does not apply route 2 (two) times daily. 04/06/13   Janith Lima, MD  SYRINGE-NEEDLE, DISP, 3 ML (B-D SYRINGE/NEEDLE 3CC/23GX1") 23G X 1" 3 ML MISC Use to inject B12 injection monthly. 04/27/22   Janith Lima, MD  Ubrogepant (UBRELVY) 100 MG TABS Take 1 tablet by mouth daily as needed. 06/20/20   Janith Lima, MD    Current Outpatient Medications  Medication Sig Dispense Refill   albuterol (VENTOLIN HFA) 108 (90 Base) MCG/ACT inhaler INHALE 2 PUFFS BY MOUTH EVERY 6 HOURS AS NEEDED FOR WHEEZING OR SHORTNESS OF BREATH 9 g 2   Esketamine HCl (SPRAVATO, 56 MG DOSE, NA) Place into the nose. Take 1 spray every 2 weeks. Administered in office.     fluticasone (FLONASE) 50 MCG/ACT nasal spray Place 4 sprays into the nose daily. 16 g 11   fluticasone (FLOVENT HFA) 110 MCG/ACT inhaler Inhale 1 puff into the lungs 2 times daily as needed. 12 g 5   pramipexole (MIRAPEX) 1 MG tablet Take 1 tablet (1 mg total) by mouth at bedtime. 90 tablet 1   cyanocobalamin (VITAMIN B12) 1000 MCG/ML injection Inject 1 mL (1,000 mcg total) into the muscle every 30 (thirty) days. 1 mL 5   fish oil-omega-3 fatty acids 1000 MG capsule Take 2 g  by mouth daily.     Spacer/Aero Chamber Mouthpiece MISC 1 Act by Does not apply route 2 (two) times daily. 1 each 11   SYRINGE-NEEDLE, DISP, 3 ML (B-D SYRINGE/NEEDLE 3CC/23GX1") 23G X 1" 3 ML MISC Use to inject B12 injection monthly. 50 each 0   Ubrogepant (UBRELVY) 100 MG TABS Take 1 tablet by mouth daily as needed. 30 tablet 1   Current Facility-Administered Medications  Medication Dose Route Frequency Provider Last Rate Last Admin   0.9 %  sodium chloride infusion  500 mL Intravenous Continuous Nimisha Rathel, [name], MD        Allergies as of 06/04/2022 - Review Complete 06/04/2022  Allergen Reaction Noted   Dulera [mometasone furo-formoterol fum]  04/06/2013   Imitrex [sumatriptan] Other (See Comments) 06/20/2020   Shellfish  allergy Other (See Comments) 03/18/2022    Family History  Problem Relation Age of Onset   Arthritis Mother    Hypertension Mother    Arthritis/Rheumatoid Mother    Depression Father    Heart disease Father    Hypertension Father    Psoriasis Sister    Lupus Sister    Depression Brother    Asthma Neg Hx    Alcohol abuse Neg Hx    Cancer Neg Hx    COPD Neg Hx    Diabetes Neg Hx    Drug abuse Neg Hx    Hyperlipidemia Neg Hx    Kidney disease Neg Hx    Stroke Neg Hx    Colon cancer Neg Hx    Esophageal cancer Neg Hx     Social History   Socioeconomic History   Marital status: Soil scientist    Spouse name: Not on file   Number of children: Not on file   Years of education: Not on file   Highest education level: Not on file  Occupational History   Not on file  Tobacco Use   Smoking status: Former   Smokeless tobacco: Never  Vaping Use   Vaping Use: Never used  Substance and Sexual Activity   Alcohol use: Yes    Alcohol/week: 3.0 standard drinks of alcohol    Types: 3 Glasses of wine per week    Comment: occ   Drug use: Not Currently   Sexual activity: Not Currently    Partners: Male  Other Topics Concern   Not on file  Social History Narrative   Not on file   Social Determinants of Health   Financial Resource Strain: Not on file  Food Insecurity: Not on file  Transportation Needs: Not on file  Physical Activity: Not on file  Stress: Not on file  Social Connections: Not on file  Intimate Partner Violence: Not on file    Review of Systems: All other review of systems negative except as mentioned in the HPI.  Physical Exam: Vital signs BP (!) 134/91   Pulse 74   Temp 98.1 F (36.7 C)   Ht '5\' 11"'$  (1.803 m)   Wt 198 lb (89.8 kg)   SpO2 97%   BMI 27.62 kg/m   General:   Alert,  Well-developed, pleasant and cooperative in NAD Lungs:  Clear throughout to auscultation.   Heart:  Regular rate and rhythm Abdomen:  Soft, nontender and  nondistended.   Neuro/Psych:  Alert and cooperative. Normal mood and affect. A and O x 3  Jolly Mango, MD [facility]

## 2022-06-04 NOTE — Patient Instructions (Signed)
YOU HAD AN ENDOSCOPIC PROCEDURE TODAY AT THE [facility]:   Refer to the procedure report that was given to you for any specific questions about what was found during the examination.  If the procedure report does not answer your questions, please call your gastroenterologist to clarify.  If you requested that your care partner not be given the details of your procedure findings, then the procedure report has been included in a sealed envelope for you to review at your convenience later.  YOU SHOULD EXPECT: Some feelings of bloating in the abdomen. Passage of more gas than usual.  Walking can help get rid of the air that was put into your GI tract during the procedure and reduce the bloating. If you had a lower endoscopy (such as a colonoscopy or flexible sigmoidoscopy) you may notice spotting of blood in your stool or on the toilet paper. If you underwent a bowel prep for your procedure, you may not have a normal bowel movement for a few days.  Please Note:  You might notice some irritation and congestion in your nose or some drainage.  This is from the oxygen used during your procedure.  There is no need for concern and it should clear up in a day or so.  SYMPTOMS TO REPORT IMMEDIATELY:  Following upper endoscopy (EGD)  Vomiting of blood or coffee ground material  New chest pain or pain under the shoulder blades  Painful or persistently difficult swallowing  New shortness of breath  Fever of 100F or higher  Black, tarry-looking stools  For urgent or emergent issues, a gastroenterologist can be reached at any hour by calling [phone]. Do not use MyChart messaging for urgent concerns.    DIET:  We do recommend a small meal at first, but then you may proceed to your regular diet.  Drink plenty of fluids but you should avoid alcoholic beverages for 24 hours.  ACTIVITY:  You should plan to take it easy for the rest of today and you should NOT DRIVE or use heavy machinery until  tomorrow (because of the sedation medicines used during the test).    FOLLOW UP: Our staff will call the number listed on your records the next business day following your procedure.  We will call around 7:15- 8:00 am to check on you and address any questions or concerns that you may have regarding the information given to you following your procedure. If we do not reach you, we will leave a message.     SIGNATURES/CONFIDENTIALITY: You and/or your care partner have signed paperwork which will be entered into your electronic medical record.  These signatures attest to the fact that that the information above on your After Visit Summary has been reviewed and is understood.  Full responsibility of the confidentiality of this discharge information lies with you and/or your care-partner. 

## 2022-06-04 NOTE — Progress Notes (Signed)
Pt's states no medical or surgical changes since previsit or office visit. 

## 2022-06-05 ENCOUNTER — Ambulatory Visit (INDEPENDENT_AMBULATORY_CARE_PROVIDER_SITE_OTHER): Payer: Commercial Managed Care - HMO

## 2022-06-05 ENCOUNTER — Ambulatory Visit
Admission: RE | Admit: 2022-06-05 | Discharge: 2022-06-05 | Disposition: A | Discharge status: Home / Self Care | Payer: Commercial Managed Care - HMO | Source: Ambulatory Visit | Attending: Internal Medicine | Admitting: Internal Medicine

## 2022-06-05 ENCOUNTER — Telehealth: Payer: Self-pay

## 2022-06-05 ENCOUNTER — Encounter: Payer: Self-pay | Admitting: Internal Medicine

## 2022-06-05 ENCOUNTER — Ambulatory Visit (INDEPENDENT_AMBULATORY_CARE_PROVIDER_SITE_OTHER): Payer: Commercial Managed Care - HMO | Admitting: Internal Medicine

## 2022-06-05 VITALS — BP 132/82 | HR 65 | Temp 98.3°F | Resp 16 | Ht 71.0 in | Wt 199.0 lb

## 2022-06-05 DIAGNOSIS — R221 Localized swelling, mass and lump, neck: Secondary | ICD-10-CM

## 2022-06-05 DIAGNOSIS — I10 Essential (primary) hypertension: Secondary | ICD-10-CM | POA: Diagnosis not present

## 2022-06-05 DIAGNOSIS — E538 Deficiency of other specified B group vitamins: Secondary | ICD-10-CM

## 2022-06-05 DIAGNOSIS — G63 Polyneuropathy in diseases classified elsewhere: Secondary | ICD-10-CM | POA: Diagnosis not present

## 2022-06-05 DIAGNOSIS — G43009 Migraine without aura, not intractable, without status migrainosus: Secondary | ICD-10-CM | POA: Diagnosis not present

## 2022-06-05 LAB — CBC WITH DIFFERENTIAL/PLATELET
Basophils Absolute: 0.1 10*3/uL (ref 0.0–0.1)
Basophils Relative: 1.1 % (ref 0.0–3.0)
Eosinophils Absolute: 0.4 10*3/uL (ref 0.0–0.7)
Eosinophils Relative: 5.3 % — ABNORMAL HIGH (ref 0.0–5.0)
HCT: 46.2 % (ref 39.0–52.0)
Hemoglobin: 15.8 g/dL (ref 13.0–17.0)
Lymphocytes Relative: 32.7 % (ref 12.0–46.0)
Lymphs Abs: 2.4 10*3/uL (ref 0.7–4.0)
MCHC: 34.3 g/dL (ref 30.0–36.0)
MCV: 88.1 fl (ref 78.0–100.0)
Monocytes Absolute: 0.7 10*3/uL (ref 0.1–1.0)
Monocytes Relative: 9.9 % (ref 3.0–12.0)
Neutro Abs: 3.7 10*3/uL (ref 1.4–7.7)
Neutrophils Relative %: 51 % (ref 43.0–77.0)
Platelets: 314 10*3/uL (ref 150.0–400.0)
RBC: 5.24 Mil/uL (ref 4.22–5.81)
RDW: 13.7 % (ref 11.5–15.5)
WBC: 7.3 10*3/uL (ref 4.0–10.5)

## 2022-06-05 LAB — C-REACTIVE PROTEIN: CRP: <1 mg/dL (ref 0.5–20.0)

## 2022-06-05 MED ORDER — IOPAMIDOL (ISOVUE-370) INJECTION 76%
75.0000 mL | Freq: Once | INTRAVENOUS | Status: AC | PRN
  Administered 2022-06-05: 75 mL via INTRAVENOUS

## 2022-06-05 MED ORDER — UBRELVY 100 MG PO TABS: 1.0000 | ORAL_TABLET | Freq: Every day | ORAL | 1 refills | Status: DC | PRN

## 2022-06-05 NOTE — Progress Notes (Signed)
Subjective:  Patient ID: Derek Tucker, male    DOB: January 18, 1971  Age: 51 y.o. MRN: [id number]  CC: throat (lump on thyroid noticed 2 days ago, refill on ubrelvy)   HPI Derek Tucker presents for f/up -  He is concerned about swelling in his anterior neck.  He did not have any symptoms related to this until someone took a photo and he became concerned about it.  He underwent upper endoscopy yesterday.    Outpatient Medications Prior to Visit  Medication Sig Dispense Refill   albuterol (VENTOLIN HFA) 108 (90 Base) MCG/ACT inhaler INHALE 2 PUFFS BY MOUTH EVERY 6 HOURS AS NEEDED FOR WHEEZING OR SHORTNESS OF BREATH 9 g 2   cyanocobalamin (VITAMIN B12) 1000 MCG/ML injection Inject 1 mL (1,000 mcg total) into the muscle every 30 (thirty) days. 1 mL 5   Esketamine HCl (SPRAVATO, 56 MG DOSE, NA) Place into the nose. Take 1 spray every 2 weeks. Administered in office.     fish oil-omega-3 fatty acids 1000 MG capsule Take 2 g by mouth daily.     fluticasone (FLONASE) 50 MCG/ACT nasal spray Place 4 sprays into the nose daily. 16 g 11   fluticasone (FLOVENT HFA) 110 MCG/ACT inhaler Inhale 1 puff into the lungs 2 times daily as needed. 12 g 5   omeprazole (PRILOSEC) 40 MG capsule Take 1 capsule (40 mg total) by mouth daily. Take twice a day for 6 weeks, then daily thereafter 90 capsule 3   pramipexole (MIRAPEX) 1 MG tablet Take 1 tablet (1 mg total) by mouth at bedtime. 90 tablet 1   Spacer/Aero Chamber Mouthpiece MISC 1 Act by Does not apply route 2 (two) times daily. 1 each 11   SYRINGE-NEEDLE, DISP, 3 ML (B-D SYRINGE/NEEDLE 3CC/23GX1") 23G X 1" 3 ML MISC Use to inject B12 injection monthly. 50 each 0   Ubrogepant (UBRELVY) 100 MG TABS Take 1 tablet by mouth daily as needed. 30 tablet 1   No facility-administered medications prior to visit.    ROS Review of Systems  Constitutional:  Negative for chills, diaphoresis, fatigue and fever.  HENT:  Positive for trouble swallowing.  Negative for nosebleeds, postnasal drip and sore throat.   Eyes: Negative.   Respiratory:  Negative for cough, chest tightness, shortness of breath and wheezing.   Cardiovascular:  Negative for chest pain, palpitations and leg swelling.  Gastrointestinal:  Negative for abdominal pain, diarrhea, nausea and vomiting.  Endocrine: Negative.   Genitourinary: Negative.  Negative for difficulty urinating.  Musculoskeletal:  Negative for arthralgias, myalgias and neck pain.  Skin: Negative.   Neurological: Negative.  Negative for dizziness and weakness.  Hematological:  Positive for adenopathy. Does not bruise/bleed easily.  Psychiatric/Behavioral: Negative.      Objective:  BP 132/82 (BP Location: Left Arm, Patient Position: Sitting, Cuff Size: Normal)   Pulse 65   Temp 98.3 F (36.8 C) (Oral)   Resp 16   Ht '5\' 11"'$  (1.803 m)   Wt 199 lb (90.3 kg)   SpO2 98%   BMI 27.75 kg/m   BP Readings from Last 3 Encounters:  06/05/22 132/82  06/04/22 123/86  05/28/22 126/78    Wt Readings from Last 3 Encounters:  06/05/22 199 lb (90.3 kg)  06/04/22 198 lb (89.8 kg)  05/28/22 198 lb 2 oz (89.9 kg)    Physical Exam Vitals reviewed.  Constitutional:      General: He is not in acute distress.    Appearance: He is not  ill-appearing, toxic-appearing or diaphoretic.  HENT:     Nose: Nose normal.     Mouth/Throat:     Mouth: Mucous membranes are moist.     Pharynx: Posterior oropharyngeal erythema present. No pharyngeal swelling, oropharyngeal exudate or uvula swelling.     Tonsils: No tonsillar exudate or tonsillar abscesses.  Eyes:     General: No scleral icterus.    Conjunctiva/sclera: Conjunctivae normal.  Neck:     Thyroid: No thyroid mass, thyromegaly or thyroid tenderness.     Trachea: Trachea normal.     Comments: There is mild swelling in the anterior neck a little worse on the right than the left.  There is palpable lymphadenopathy slightly more prominent on the left than the  right.  The lymph nodes do not feel pathologic. Cardiovascular:     Rate and Rhythm: Normal rate and regular rhythm.     Heart sounds: No murmur heard. Pulmonary:     Effort: Pulmonary effort is normal.     Breath sounds: No stridor. No wheezing, rhonchi or rales.  Abdominal:     General: Abdomen is flat.     Palpations: There is no mass.     Tenderness: There is no abdominal tenderness. There is no guarding.     Hernia: No hernia is present.  Musculoskeletal:        General: Normal range of motion.     Cervical back: Normal range of motion and neck supple. No edema, erythema or tenderness. No pain with movement.     Right lower leg: No edema.     Left lower leg: No edema.  Lymphadenopathy:     Head:     Right side of head: No submental, submandibular, tonsillar, preauricular, posterior auricular or occipital adenopathy.     Left side of head: No submental, submandibular, tonsillar, preauricular, posterior auricular or occipital adenopathy.     Cervical: No cervical adenopathy.     Right cervical: No superficial, deep or posterior cervical adenopathy.    Left cervical: No superficial, deep or posterior cervical adenopathy.  Skin:    General: Skin is warm and dry.     Coloration: Skin is not pale.     Findings: No rash.  Neurological:     General: No focal deficit present.     Mental Status: He is alert.  Psychiatric:        Mood and Affect: Mood normal.        Behavior: Behavior normal.     Lab Results  Component Value Date   WBC 7.3 06/05/2022   HGB 15.8 06/05/2022   HCT 46.2 06/05/2022   PLT 314.0 06/05/2022   GLUCOSE 115 (H) 04/23/2022   CHOL 127 02/14/2021   TRIG 209.0 (H) 02/14/2021   HDL 38.60 (L) 02/14/2021   LDLDIRECT 71.0 02/14/2021   LDLCALC 64 11/29/2019   ALT 30 04/23/2022   AST 25 04/23/2022   NA 137 04/23/2022   K 4.1 04/23/2022   CL 101 04/23/2022   CREATININE 0.99 04/23/2022   BUN 6 04/23/2022   CO2 28 04/23/2022   TSH 2.46 02/14/2021   PSA  0.24 04/23/2022    DG Hand Complete Right  Result Date: 01/16/2022 CLINICAL DATA:  Bilateral hand and finger pain/stiffness in PIP joints mainly in the morning for 3 months. EXAM: RIGHT HAND - COMPLETE 3+ VIEW; LEFT HAND - COMPLETE 3+ VIEW COMPARISON:  None Available. FINDINGS: Right hand: Normal bone mineralization. Joint spaces are preserved. No acute fracture is seen.  No dislocation. Left hand: Normal bone mineralization. Joint spaces are preserved. No acute fracture is seen. No dislocation. No cortical erosion or periostitis within either hand. IMPRESSION: Normal bilateral hand radiographs. Electronically Signed   By: Yvonne Kendall M.D.   On: 01/16/2022 11:05   DG Hand Complete Left  Result Date: 01/16/2022 CLINICAL DATA:  Bilateral hand and finger pain/stiffness in PIP joints mainly in the morning for 3 months. EXAM: RIGHT HAND - COMPLETE 3+ VIEW; LEFT HAND - COMPLETE 3+ VIEW COMPARISON:  None Available. FINDINGS: Right hand: Normal bone mineralization. Joint spaces are preserved. No acute fracture is seen. No dislocation. Left hand: Normal bone mineralization. Joint spaces are preserved. No acute fracture is seen. No dislocation. No cortical erosion or periostitis within either hand. IMPRESSION: Normal bilateral hand radiographs. Electronically Signed   By: Yvonne Kendall M.D.   On: 01/16/2022 11:05   DG Neck Soft Tissue  Result Date: 06/05/2022 CLINICAL DATA:  Neck swelling EXAM: NECK SOFT TISSUES - 2 VIEW COMPARISON:  None Available. FINDINGS: No evidence of retropharyngeal soft tissue swelling or epiglottic enlargement. Possible increased density seen in the right lateral soft tissues of the neck base. Calcification of the thyroid cartilage. Cervical airway is unremarkable and no radio-opaque foreign body identified. IMPRESSION: 1. Possible increased density seen in the right lateral soft tissues of the neck base. Recommend further evaluation with CT of the neck with contrast. 2. No evidence of  retropharyngeal soft tissue swelling or epiglottic enlargement. Electronically Signed   By: Yetta Glassman M.D.   On: 06/05/2022 11:01     Assessment & Plan:   Derek was seen today for throat.  Diagnoses and all orders for this visit:  Neck swelling- Labs are reassuring.  Plain film raises suspicion for an abnormality on the right side.  I think the best test to do to evaluate this is a CT with contrast. -     DG Neck Soft Tissue; Future -     CBC with Differential/Platelet; Future -     C-reactive protein; Future -     C-reactive protein -     CBC with Differential/Platelet -     CT Soft Tissue Neck W Contrast; Future  Essential hypertension- His blood pressure is well-controlled. -     CBC with Differential/Platelet; Future -     CBC with Differential/Platelet  Vitamin B12 deficiency neuropathy (HCC) -     CBC with Differential/Platelet; Future -     CBC with Differential/Platelet  Migraine without aura and without status migrainosus, not intractable -     Ubrogepant (UBRELVY) 100 MG TABS; Take 1 tablet by mouth daily as needed.   I am having Derek Tucker "Beau" maintain his fish oil-omega-3 fatty acids, fluticasone, Spacer/Aero Chamber Mouthpiece, Flovent HFA, (Esketamine HCl (SPRAVATO, 56 MG DOSE, NA)), albuterol, pramipexole, cyanocobalamin, B-D SYRINGE/NEEDLE 3CC/23GX1", omeprazole, and Ubrelvy.  Meds ordered this encounter  Medications   Ubrogepant (UBRELVY) 100 MG TABS    Sig: Take 1 tablet by mouth daily as needed.    Dispense:  30 tablet    Refill:  1     Follow-up: Return in about 3 months (around 09/04/2022).  Scarlette Calico, MD

## 2022-06-05 NOTE — Patient Instructions (Signed)

## 2022-06-05 NOTE — Telephone Encounter (Signed)
  Follow up Call-     06/04/2022    1:02 PM  Call back number  Post procedure Call Back phone  # [phone]  Permission to leave phone message Yes     Patient questions:  Do you have a fever, pain , or abdominal swelling? No. Pain Score  0 *  Have you tolerated food without any problems? Yes.    Have you been able to return to your normal activities? Yes.    Do you have any questions about your discharge instructions: Diet   No. Medications  No. Follow up visit  No.  Do you have questions or concerns about your Care? No.  Actions: * If pain score is 4 or above: No action needed, pain <4.

## 2022-06-13 ENCOUNTER — Other Ambulatory Visit: Payer: Self-pay | Admitting: Internal Medicine

## 2022-06-13 DIAGNOSIS — F331 Major depressive disorder, recurrent, moderate: Secondary | ICD-10-CM

## 2022-06-17 ENCOUNTER — Telehealth: Payer: Self-pay

## 2022-06-17 NOTE — Telephone Encounter (Signed)
Key: [id number]

## 2022-06-17 NOTE — Telephone Encounter (Signed)
Approved   Start Date:06/17/2022;Coverage End Date:06/17/2023;

## 2022-06-29 ENCOUNTER — Other Ambulatory Visit: Payer: Self-pay | Admitting: Internal Medicine

## 2022-07-08 ENCOUNTER — Encounter (HOSPITAL_BASED_OUTPATIENT_CLINIC_OR_DEPARTMENT_OTHER): Payer: Self-pay

## 2022-07-20 ENCOUNTER — Other Ambulatory Visit: Payer: Self-pay | Admitting: Internal Medicine

## 2022-07-20 ENCOUNTER — Encounter: Payer: Commercial Managed Care - HMO | Admitting: Gastroenterology

## 2022-07-20 DIAGNOSIS — J4530 Mild persistent asthma, uncomplicated: Secondary | ICD-10-CM

## 2022-07-20 MED ORDER — AIRSUPRA 90-80 MCG/ACT IN AERO: 2.0000 | INHALATION_SPRAY | Freq: Four times a day (QID) | RESPIRATORY_TRACT | 3 refills | Status: DC | PRN

## 2022-07-22 ENCOUNTER — Ambulatory Visit (HOSPITAL_BASED_OUTPATIENT_CLINIC_OR_DEPARTMENT_OTHER): Payer: Commercial Managed Care - HMO | Admitting: Physical Therapy

## 2022-07-28 ENCOUNTER — Ambulatory Visit (HOSPITAL_BASED_OUTPATIENT_CLINIC_OR_DEPARTMENT_OTHER): Payer: Commercial Managed Care - HMO | Attending: Family Medicine | Admitting: Physical Therapy

## 2022-08-13 ENCOUNTER — Encounter: Payer: Self-pay | Admitting: Certified Registered Nurse Anesthetist

## 2022-08-14 ENCOUNTER — Encounter: Payer: Self-pay | Admitting: Gastroenterology

## 2022-08-14 ENCOUNTER — Telehealth: Payer: Self-pay

## 2022-08-14 ENCOUNTER — Other Ambulatory Visit: Payer: Self-pay | Admitting: Internal Medicine

## 2022-08-14 ENCOUNTER — Ambulatory Visit (AMBULATORY_SURGERY_CENTER): Payer: Commercial Managed Care - HMO | Admitting: Gastroenterology

## 2022-08-14 VITALS — BP 119/70 | HR 75 | Temp 98.7°F | Resp 12 | Ht 71.0 in | Wt 198.0 lb

## 2022-08-14 DIAGNOSIS — K3189 Other diseases of stomach and duodenum: Secondary | ICD-10-CM

## 2022-08-14 DIAGNOSIS — R1310 Dysphagia, unspecified: Secondary | ICD-10-CM | POA: Diagnosis present

## 2022-08-14 DIAGNOSIS — J4530 Mild persistent asthma, uncomplicated: Secondary | ICD-10-CM

## 2022-08-14 MED ORDER — PANTOPRAZOLE SODIUM 40 MG PO TBEC: 40.0000 mg | DELAYED_RELEASE_TABLET | Freq: Two times a day (BID) | ORAL | 3 refills | Status: DC

## 2022-08-14 MED ORDER — ALBUTEROL SULFATE HFA 108 (90 BASE) MCG/ACT IN AERS: 1.0000 | INHALATION_SPRAY | Freq: Four times a day (QID) | RESPIRATORY_TRACT | 2 refills | Status: DC | PRN

## 2022-08-14 MED ORDER — SODIUM CHLORIDE 0.9 % IV SOLN: 500.0000 mL | Freq: Once | INTRAVENOUS | Status: DC

## 2022-08-14 NOTE — Progress Notes (Signed)
Called to room to assist during endoscopic procedure.  Patient ID and intended procedure confirmed with present staff. Received instructions for my participation in the procedure from the performing physician.  

## 2022-08-14 NOTE — Op Note (Signed)
[facility] Patient Name: Derek Tucker Procedure Date: 08/14/2022 10:10 AM MRN: [id number] Endoscopist: Remo Lipps P. 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 Recommended omeprazole. Patient has                            not been able to tolerate it due to causing                            headaches. Has made changes in diet, lost weight,                            with improvement in symptoms but not on any medical                            therapy currently Medicines:                Monitored Anesthesia Care Procedure:                Pre-Anesthesia Assessment:                           - Prior to the procedure, a History and Physical                            was performed, and patient medications and                            allergies were reviewed. The patient's tolerance of                            previous anesthesia was also reviewed. The risks                            and benefits of the procedure and the sedation                            options and risks were discussed with the patient.                            All questions were answered, and informed consent                            was obtained. Prior Anticoagulants: The patient has  taken no anticoagulant or antiplatelet agents. ASA                            Grade Assessment: II - A patient with mild systemic                            disease. After reviewing the risks and benefits,                            the patient was deemed in satisfactory condition to                            undergo the procedure.                           After obtaining informed  consent, the endoscope was                            passed under direct vision. Throughout the                            procedure, the patient's blood pressure, pulse, and                            oxygen saturations were monitored continuously. The                            GIF [id number] was introduced through the                            mouth, and advanced to the body of the stomach. The                            upper GI endoscopy was accomplished without                            difficulty. The patient tolerated the procedure                            well. Scope In: Scope Out: Findings:                 LA Grade C esophagitis was found in the mid to                            distal esophagus. Possible short segment of                            Barrett's near the GEJ but hard to tell in light of                            inflammatory changes                           One benign-appearing,  intrinsic stenosis was found                            at the GEJ - passage with the endoscope dilated it                            slightly but lumen was more patent than the last                            exam.                           Mucosal changes including ringed esophagus and                            longitudinal furrows were found in the mid                            esophagus. Biopsies were obtained from the proximal                            and distal esophagus with cold forceps to rule out                            eosinophilic esophagitis.                           The exam of the esophagus was otherwise normal.                           Food (residue) was found in the gastric body                            prohibiting views of the stomach. Once this was                            noted the procedure was aborted, full exam not                            performed. Complications:            No immediate complications. Estimated blood loss:                             Minimal. Estimated Blood Loss:     Estimated blood loss was minimal. Impression:               - LA Grade C reflux esophagitis with a possible                            short segment of Barrett's.                           - Benign-appearing esophageal stenosis - more  patent than the last exam.                           - Esophageal mucosal changes suspicious for                            possible eosinophilic esophagitis.                           - Biopsies were taken with a cold forceps for                            evaluation of eosinophilic esophagitis.                           - Residual food in the stomach - procedure aborted                           Unfortunately the patient has not tolerated                            omeprazole due to causing headaches. Recommend a                            different formulation of PPI to see if he tolerates                            it better. If that does not work consider                            alternatives such as voneprazan (if available) or                            pepcid. May also consider gastric emptying study                            pending his course. Recommendation:           - Patient has a contact number available for                            emergencies. The signs and symptoms of potential                            delayed complications were discussed with the                            patient. Return to normal activities tomorrow.                            Written discharge instructions were provided to the                            patient.                           -  Resume previous diet.                           - Continue present medications.                           - Trial of protonix '40mg'$  BID for 6 weeks then once                            daily thereafter (can give 1 month supply initially)                           - Contact us if side effects of protonix                            - Await pathology results.                           - Repeat EGD in 6-8 weeks for reassessment and                            further dilation (ON ANTACIDS) Remo Lipps P. Havery Moros, MD 08/14/2022 10:26:45 AM This report has been signed electronically.

## 2022-08-14 NOTE — Progress Notes (Signed)
Report given to PACU, vss 

## 2022-08-14 NOTE — Progress Notes (Signed)
1008 Robinul 0.1 mg IV given due large amount of secretions upon assessment.  MD made aware, vss

## 2022-08-14 NOTE — Progress Notes (Signed)
[facility] History and Physical   Primary Care Physician:  Janith Lima, MD   Reason for Procedure:   Dysphagia secondary to esophageal stricture / esophagitis   Plan:    EGD with dilation     HPI: Derek Tucker is a 52 y.o. male  here for EGD to re-evaluate esophageal stricture, history of esophagitis. Last exam 06/04/22 with dilation using the scope only, tight GEJ stricture with LA grade C esophagitis. Recommended omeprazole '40mg'$  BID for several weeks then daily thereafter. He reports prilosec causes headaches and he can't tolerate it, has not been taking it much. However he has lost weight and made major dietary changes with improvement in reflux. Otherwise feels well without any cardiopulmonary symptoms.   I have discussed risks / benefits of anesthesia and endoscopic procedure with Derek C Istre and they wish to proceed with the exams as outlined today.    Past Medical History:  Diagnosis Date   Allergy    Arthritis    Asthma    Depression    History of gallstones     Past Surgical History:  Procedure Laterality Date   APPENDECTOMY  06/15/1980   CHOLECYSTECTOMY  2017   HERNIA REPAIR N/A [id number]   Umbilical   KNEE ARTHROSCOPY  06/15/2010   TONSILLECTOMY  08/28/2021    Prior to Admission medications   Medication Sig Start Date End Date Taking? Authorizing Provider  albuterol (VENTOLIN HFA) 108 (90 Base) MCG/ACT inhaler INHALE 2 PUFFS BY MOUTH EVERY 6 HOURS AS NEEDED FOR WHEEZING OR SHORTNESS OF BREATH 07/20/22  Yes Janith Lima, MD  escitalopram (LEXAPRO) 20 MG tablet Take 20 mg by mouth daily. 08/07/22  Yes [provider]  Esketamine HCl (SPRAVATO, 56 MG DOSE, NA) Place into the nose. Take 1 spray every 2 weeks. Administered in office.   Yes [provider]  fluticasone (FLONASE) 50 MCG/ACT nasal spray Place 4 sprays into the nose daily. 04/06/13  Yes Janith Lima, MD  fluticasone (FLOVENT HFA) 110 MCG/ACT inhaler  Inhale 1 puff into the lungs 2 times daily as needed. 09/26/20  Yes Janith Lima, MD  pramipexole (MIRAPEX) 1 MG tablet TAKE 1 TABLET BY MOUTH AT BEDTIME 06/13/22  Yes Janith Lima, MD  Spacer/Aero Chamber Mouthpiece MISC 1 Act by Does not apply route 2 (two) times daily. 04/06/13  Yes Janith Lima, MD  Albuterol-Budesonide (AIRSUPRA) 90-80 MCG/ACT AERO Inhale 2 puffs into the lungs 4 (four) times daily as needed. 07/20/22   Janith Lima, MD  cyanocobalamin (VITAMIN B12) 1000 MCG/ML injection Inject 1 mL (1,000 mcg total) into the muscle every 30 (thirty) days. Patient not taking: Reported on 08/14/2022 04/27/22   Janith Lima, MD  EPINEPHrine 0.3 mg/0.3 mL IJ SOAJ injection  10/06/21   [provider]  fish oil-omega-3 fatty acids 1000 MG capsule Take 2 g by mouth daily. Patient not taking: Reported on 08/14/2022    [provider]  omeprazole (PRILOSEC) 40 MG capsule Take 1 capsule (40 mg total) by mouth daily. Take twice a day for 6 weeks, then daily thereafter Patient not taking: Reported on 08/14/2022 06/04/22   Tigran Haynie, [name], MD  SYRINGE-NEEDLE, DISP, 3 ML (B-D SYRINGE/NEEDLE 3CC/23GX1") 23G X 1" 3 ML MISC Use to inject B12 injection monthly. Patient not taking: Reported on 08/14/2022 04/27/22   Janith Lima, MD  Ubrogepant (UBRELVY) 100 MG TABS Take 1 tablet by mouth daily as needed. Patient not taking: Reported on 08/14/2022  06/05/22   Janith Lima, MD    Current Outpatient Medications  Medication Sig Dispense Refill   albuterol (VENTOLIN HFA) 108 (90 Base) MCG/ACT inhaler INHALE 2 PUFFS BY MOUTH EVERY 6 HOURS AS NEEDED FOR WHEEZING OR SHORTNESS OF BREATH 9 g 0   escitalopram (LEXAPRO) 20 MG tablet Take 20 mg by mouth daily.     Esketamine HCl (SPRAVATO, 56 MG DOSE, NA) Place into the nose. Take 1 spray every 2 weeks. Administered in office.     fluticasone (FLONASE) 50 MCG/ACT nasal spray Place 4 sprays into the nose daily. 16 g 11   fluticasone  (FLOVENT HFA) 110 MCG/ACT inhaler Inhale 1 puff into the lungs 2 times daily as needed. 12 g 5   pramipexole (MIRAPEX) 1 MG tablet TAKE 1 TABLET BY MOUTH AT BEDTIME 90 tablet 1   Spacer/Aero Chamber Mouthpiece MISC 1 Act by Does not apply route 2 (two) times daily. 1 each 11   Albuterol-Budesonide (AIRSUPRA) 90-80 MCG/ACT AERO Inhale 2 puffs into the lungs 4 (four) times daily as needed. 10.7 g 3   cyanocobalamin (VITAMIN B12) 1000 MCG/ML injection Inject 1 mL (1,000 mcg total) into the muscle every 30 (thirty) days. (Patient not taking: Reported on 08/14/2022) 1 mL 5   EPINEPHrine 0.3 mg/0.3 mL IJ SOAJ injection  (Patient not taking: Reported on 08/14/2022)     fish oil-omega-3 fatty acids 1000 MG capsule Take 2 g by mouth daily. (Patient not taking: Reported on 08/14/2022)     omeprazole (PRILOSEC) 40 MG capsule Take 1 capsule (40 mg total) by mouth daily. Take twice a day for 6 weeks, then daily thereafter (Patient not taking: Reported on 08/14/2022) 90 capsule 3   SYRINGE-NEEDLE, DISP, 3 ML (B-D SYRINGE/NEEDLE 3CC/23GX1") 23G X 1" 3 ML MISC Use to inject B12 injection monthly. (Patient not taking: Reported on 08/14/2022) 50 each 0   Ubrogepant (UBRELVY) 100 MG TABS Take 1 tablet by mouth daily as needed. (Patient not taking: Reported on 08/14/2022) 30 tablet 1   Current Facility-Administered Medications  Medication Dose Route Frequency Provider Last Rate Last Admin   0.9 %  sodium chloride infusion  500 mL Intravenous Once Joyanne Eddinger, [name], MD        Allergies as of 08/14/2022 - Review Complete 08/14/2022  Allergen Reaction Noted   Dulera [mometasone furo-formoterol fum]  04/06/2013   Imitrex [sumatriptan] Other (See Comments) 06/20/2020   Prilosec [omeprazole] Other (See Comments) 08/14/2022   Shellfish allergy Rash 03/18/2022    Family History  Problem Relation Age of Onset   Arthritis Mother    Hypertension Mother    Arthritis/Rheumatoid Mother    Depression Father    Heart disease  Father    Hypertension Father    Psoriasis Sister    Lupus Sister    Depression Brother    Asthma Neg Hx    Alcohol abuse Neg Hx    Cancer Neg Hx    COPD Neg Hx    Diabetes Neg Hx    Drug abuse Neg Hx    Hyperlipidemia Neg Hx    Kidney disease Neg Hx    Stroke Neg Hx    Colon cancer Neg Hx    Esophageal cancer Neg Hx     Social History   Socioeconomic History   Marital status: Soil scientist    Spouse name: Not on file   Number of children: Not on file   Years of education: Not on file   Highest education level:  Not on file  Occupational History   Not on file  Tobacco Use   Smoking status: Former   Smokeless tobacco: Never  Vaping Use   Vaping Use: Never used  Substance and Sexual Activity   Alcohol use: Yes    Alcohol/week: 3.0 standard drinks of alcohol    Types: 3 Glasses of wine per week    Comment: occ   Drug use: Not Currently   Sexual activity: Not Currently    Partners: Male  Other Topics Concern   Not on file  Social History Narrative   Not on file   Social Determinants of Health   Financial Resource Strain: Not on file  Food Insecurity: Not on file  Transportation Needs: Not on file  Physical Activity: Not on file  Stress: Not on file  Social Connections: Not on file  Intimate Partner Violence: Not on file    Review of Systems: All other review of systems negative except as mentioned in the HPI.  Physical Exam: Vital signs BP 126/68   Pulse 79   Temp 98.7 F (37.1 C)   Ht '5\' 11"'$  (1.803 m)   Wt 198 lb (89.8 kg)   SpO2 100%   BMI 27.62 kg/m   General:   Alert,  Well-developed, pleasant and cooperative in NAD Lungs:  Clear throughout to auscultation.   Heart:  Regular rate and rhythm Abdomen:  Soft, nontender and nondistended.   Neuro/Psych:  Alert and cooperative. Normal mood and affect. A and O x 3  Jolly Mango, MD [facility]

## 2022-08-14 NOTE — Telephone Encounter (Signed)
-----   Message from Yetta Flock, MD sent at 08/14/2022 10:57 AM EST ----- Regarding: GES  Alfredo can you help order this patient a gastric emptying study - rule out gastroparesis. Thanks!  Dr. Loni Muse

## 2022-08-14 NOTE — Patient Instructions (Signed)
Information on esophagitis and strictures given to you today. Await pathology results from the biopsies taken today. Trial of Protonix 40 mg twice a day for 6 weeks then once a day thereafter.  Contact us if side effects from the Protonix.  Repeat EGD in 6-8 weeks for reassessment and further dilation.   YOU HAD AN ENDOSCOPIC PROCEDURE TODAY AT [facility]:   Refer to the procedure report that was given to you for any specific questions about what was found during the examination.  If the procedure report does not answer your questions, please call your gastroenterologist to clarify.  If you requested that your care partner not be given the details of your procedure findings, then the procedure report has been included in a sealed envelope for you to review at your convenience later.  YOU SHOULD EXPECT: Some feelings of bloating in the abdomen. Passage of more gas than usual.  Walking can help get rid of the air that was put into your GI tract during the procedure and reduce the bloating. If you had a lower endoscopy (such as a colonoscopy or flexible sigmoidoscopy) you may notice spotting of blood in your stool or on the toilet paper. If you underwent a bowel prep for your procedure, you may not have a normal bowel movement for a few days.  Please Note:  You might notice some irritation and congestion in your nose or some drainage.  This is from the oxygen used during your procedure.  There is no need for concern and it should clear up in a day or so.  SYMPTOMS TO REPORT IMMEDIATELY:   Following upper endoscopy (EGD)  Vomiting of blood or coffee ground material  New chest pain or pain under the shoulder blades  Painful or persistently difficult swallowing  New shortness of breath  Fever of 100F or higher  Black, tarry-looking stools  For urgent or emergent issues, a gastroenterologist can be reached at any hour by calling [phone]. Do not use MyChart messaging for  urgent concerns.    DIET:  We do recommend a small meal at first, but then you may proceed to your regular diet.  Drink plenty of fluids but you should avoid alcoholic beverages for 24 hours.  ACTIVITY:  You should plan to take it easy for the rest of today and you should NOT DRIVE or use heavy machinery until tomorrow (because of the sedation medicines used during the test).    FOLLOW UP: Our staff will call the number listed on your records the next business day following your procedure.  We will call around 7:15- 8:00 am to check on you and address any questions or concerns that you may have regarding the information given to you following your procedure. If we do not reach you, we will leave a message.     If any biopsies were taken you will be contacted by phone or by letter within the next 1-3 weeks.  Please call us at [phone] if you have not heard about the biopsies in 3 weeks.    SIGNATURES/CONFIDENTIALITY: You and/or your care partner have signed paperwork which will be entered into your electronic medical record.  These signatures attest to the fact that that the information above on your After Visit Summary has been reviewed and is understood.  Full responsibility of the confidentiality of this discharge information lies with you and/or your care-partner.

## 2022-08-14 NOTE — Telephone Encounter (Signed)
GES study order in epic. Secure staff message sent to radiology scheduling to contact patient to set up appt.

## 2022-08-17 ENCOUNTER — Telehealth: Payer: Self-pay

## 2022-08-17 NOTE — Telephone Encounter (Signed)
Attempted to reach patient for post-procedure f/u call. No answer. Left message for him to please not hesitate to call if he has any questions/concerns regarding his care.

## 2022-08-17 NOTE — Telephone Encounter (Signed)
GES scheduled for Thursday, 09/10/22 at 7:30 am

## 2022-08-24 ENCOUNTER — Ambulatory Visit: Payer: Commercial Managed Care - HMO | Admitting: Internal Medicine

## 2022-09-10 ENCOUNTER — Encounter (HOSPITAL_COMMUNITY)
Admission: RE | Admit: 2022-09-10 | Discharge: 2022-09-10 | Disposition: A | Discharge status: Home / Self Care | Payer: Commercial Managed Care - HMO | Source: Ambulatory Visit | Attending: Gastroenterology | Admitting: Gastroenterology

## 2022-09-10 DIAGNOSIS — K3189 Other diseases of stomach and duodenum: Secondary | ICD-10-CM | POA: Diagnosis present

## 2022-09-10 MED ORDER — TECHNETIUM TC 99M SULFUR COLLOID
2.2000 | Freq: Once | INTRAVENOUS | Status: AC
  Administered 2022-09-10: 2.2 via ORAL

## 2022-09-11 ENCOUNTER — Encounter: Payer: Self-pay | Admitting: Gastroenterology

## 2022-10-03 ENCOUNTER — Other Ambulatory Visit: Payer: Self-pay | Admitting: Internal Medicine

## 2022-10-04 ENCOUNTER — Other Ambulatory Visit: Payer: Self-pay | Admitting: Internal Medicine

## 2022-10-04 DIAGNOSIS — J4530 Mild persistent asthma, uncomplicated: Secondary | ICD-10-CM

## 2022-10-06 ENCOUNTER — Ambulatory Visit (AMBULATORY_SURGERY_CENTER): Payer: Commercial Managed Care - HMO | Admitting: Gastroenterology

## 2022-10-06 ENCOUNTER — Encounter: Payer: Self-pay | Admitting: Gastroenterology

## 2022-10-06 VITALS — BP 122/78 | HR 72 | Temp 97.5°F | Resp 13 | Ht 70.0 in | Wt 195.0 lb

## 2022-10-06 DIAGNOSIS — K2100 Gastro-esophageal reflux disease with esophagitis, without bleeding: Secondary | ICD-10-CM

## 2022-10-06 DIAGNOSIS — K222 Esophageal obstruction: Secondary | ICD-10-CM

## 2022-10-06 DIAGNOSIS — R1310 Dysphagia, unspecified: Secondary | ICD-10-CM

## 2022-10-06 MED ORDER — SODIUM CHLORIDE 0.9 % IV SOLN: 500.0000 mL | Freq: Once | INTRAVENOUS | Status: DC

## 2022-10-06 NOTE — Patient Instructions (Addendum)
Post dilation diet. Continue present medications. Continue Protonix. Referral to Dr. Barron Alvine for TIF evaluation, if patient desires.  Handouts on esophagitis and stricture, Post esophageal dilation diet, and hemorrhoid banding provided.  YOU HAD AN ENDOSCOPIC PROCEDURE TODAY AT THE [facility]:   Refer to the procedure report that was given to you for any specific questions about what was found during the examination.  If the procedure report does not answer your questions, please call your gastroenterologist to clarify.  If you requested that your care partner not be given the details of your procedure findings, then the procedure report has been included in a sealed envelope for you to review at your convenience later.  YOU SHOULD EXPECT: Some feelings of bloating in the abdomen. Passage of more gas than usual.  Walking can help get rid of the air that was put into your GI tract during the procedure and reduce the bloating. If you had a lower endoscopy (such as a colonoscopy or flexible sigmoidoscopy) you may notice spotting of blood in your stool or on the toilet paper. If you underwent a bowel prep for your procedure, you may not have a normal bowel movement for a few days.  Please Note:  You might notice some irritation and congestion in your nose or some drainage.  This is from the oxygen used during your procedure.  There is no need for concern and it should clear up in a day or so.  SYMPTOMS TO REPORT IMMEDIATELY: Following upper endoscopy (EGD)  Vomiting of blood or coffee ground material  New chest pain or pain under the shoulder blades  Painful or persistently difficult swallowing  New shortness of breath  Fever of 100F or higher  Black, tarry-looking stools  For urgent or emergent issues, a gastroenterologist can be reached at any hour by calling [phone]. Do not use MyChart messaging for urgent concerns.    DIET:  Nothing by mouth until 11:25 a.m., then  clear liquids from 11:25 am-12:25 pm.  After 12:25 pm, soft diet for the rest of today.  Drink plenty of fluids but you should avoid alcoholic beverages for 24 hours.  ACTIVITY:  You should plan to take it easy for the rest of today and you should NOT DRIVE or use heavy machinery until tomorrow (because of the sedation medicines used during the test).    FOLLOW UP: Our staff will call the number listed on your records the next business day following your procedure.  We will call around 7:15- 8:00 am to check on you and address any questions or concerns that you may have regarding the information given to you following your procedure. If we do not reach you, we will leave a message.     If any biopsies were taken you will be contacted by phone or by letter within the next 1-3 weeks.  Please call us at [phone] if you have not heard about the biopsies in 3 weeks.    SIGNATURES/CONFIDENTIALITY: You and/or your care partner have signed paperwork which will be entered into your electronic medical record.  These signatures attest to the fact that that the information above on your After Visit Summary has been reviewed and is understood.  Full responsibility of the confidentiality of this discharge information lies with you and/or your care-partner.

## 2022-10-06 NOTE — Progress Notes (Signed)
[facility] History and Physical   Primary Care Physician:  Etta Grandchild, MD   Reason for Procedure:   Follow up esophagitis, dysphagia  Plan:    EGD with dilation     HPI: Beau-Jacques C Mapps is a 52 y.o. male  here for EGD to evaluate esophagitis - ensure appropriate healing following therapy with PPI. Did not tolerate omeprazole but now on protonix 40mg  - took it BID for 1 month and now once daily. He tolerates this EGD in 05/2022 showed esophagitis with distal esophageal stricture but not dilated due to esophagitis. Was not on PPI at the time. Recommended omeprazole but had side effects, did not take it, last EGD 08/14/22 with ongoing esophagitis off therapy. Now on protonix.   Otherwise feels well without any cardiopulmonary symptoms.   I have discussed risks / benefits of anesthesia and endoscopic procedure with Beau-Jacques C Batts and they wish to proceed with the exams as outlined today.    Past Medical History:  Diagnosis Date   Allergy    Anxiety    Arthritis    Asthma    Depression    GERD (gastroesophageal reflux disease)    History of gallstones     Past Surgical History:  Procedure Laterality Date   APPENDECTOMY  06/15/1980   CHOLECYSTECTOMY  2017   HERNIA REPAIR N/A 2005   Umbilical   KNEE ARTHROSCOPY  06/15/2010   TONSILLECTOMY  08/28/2021    Prior to Admission medications   Medication Sig Start Date End Date Taking? Authorizing Provider  escitalopram (LEXAPRO) 20 MG tablet Take 20 mg by mouth daily. 08/07/22  Yes [provider]  fluticasone (FLONASE) 50 MCG/ACT nasal spray Place 4 sprays into the nose daily. 04/06/13  Yes Etta Grandchild, MD  fluticasone (FLOVENT HFA) 110 MCG/ACT inhaler Inhale 1 puff into the lungs 2 times daily as needed. 09/26/20  Yes Etta Grandchild, MD  pantoprazole (PROTONIX) 40 MG tablet Take 1 tablet (40 mg total) by mouth 2 (two) times daily. 08/14/22  Yes Rogue Pautler, Willaim Rayas, MD  albuterol (VENTOLIN HFA)  108 (90 Base) MCG/ACT inhaler INHALE 2 PUFFS BY MOUTH EVERY 6 HOURS AS NEEDED FOR WHEEZING OR SHORTNESS OF BREATH 10/05/22   Etta Grandchild, MD  cyanocobalamin (VITAMIN B12) 1000 MCG/ML injection INJECT 1 ML INTRAMUSCULARLY  ONCE EVERY MONTH 10/03/22   Etta Grandchild, MD  EPINEPHrine 0.3 mg/0.3 mL IJ SOAJ injection  10/06/21   [provider]  Esketamine HCl (SPRAVATO, 56 MG DOSE, NA) Place into the nose. Take 1 spray every 2 weeks. Administered in office.    [provider]  fish oil-omega-3 fatty acids 1000 MG capsule Take 2 g by mouth daily. Patient not taking: Reported on 08/14/2022    [provider]  omeprazole (PRILOSEC) 40 MG capsule Take 1 capsule (40 mg total) by mouth daily. Take twice a day for 6 weeks, then daily thereafter Patient not taking: Reported on 10/06/2022 06/04/22   Benancio Deeds, MD  pramipexole (MIRAPEX) 1 MG tablet TAKE 1 TABLET BY MOUTH AT BEDTIME 06/13/22   Etta Grandchild, MD  Spacer/Aero Chamber Mouthpiece MISC 1 Act by Does not apply route 2 (two) times daily. 04/06/13   Etta Grandchild, MD  SYRINGE-NEEDLE, DISP, 3 ML (B-D SYRINGE/NEEDLE 3CC/23GX1") 23G X 1" 3 ML MISC Use to inject B12 injection monthly. Patient not taking: Reported on 08/14/2022 04/27/22   Etta Grandchild, MD  Ubrogepant (UBRELVY) 100 MG TABS Take 1 tablet  by mouth daily as needed. Patient not taking: Reported on 08/14/2022 06/05/22   Etta Grandchild, MD    Current Outpatient Medications  Medication Sig Dispense Refill   escitalopram (LEXAPRO) 20 MG tablet Take 20 mg by mouth daily.     fluticasone (FLONASE) 50 MCG/ACT nasal spray Place 4 sprays into the nose daily. 16 g 11   fluticasone (FLOVENT HFA) 110 MCG/ACT inhaler Inhale 1 puff into the lungs 2 times daily as needed. 12 g 5   pantoprazole (PROTONIX) 40 MG tablet Take 1 tablet (40 mg total) by mouth 2 (two) times daily. 90 tablet 3   albuterol (VENTOLIN HFA) 108 (90 Base) MCG/ACT inhaler INHALE 2 PUFFS BY MOUTH  EVERY 6 HOURS AS NEEDED FOR WHEEZING OR SHORTNESS OF BREATH 18 g 3   cyanocobalamin (VITAMIN B12) 1000 MCG/ML injection INJECT 1 ML INTRAMUSCULARLY  ONCE EVERY MONTH 1 mL 0   EPINEPHrine 0.3 mg/0.3 mL IJ SOAJ injection  (Patient not taking: Reported on 08/14/2022)     Esketamine HCl (SPRAVATO, 56 MG DOSE, NA) Place into the nose. Take 1 spray every 2 weeks. Administered in office.     fish oil-omega-3 fatty acids 1000 MG capsule Take 2 g by mouth daily. (Patient not taking: Reported on 08/14/2022)     omeprazole (PRILOSEC) 40 MG capsule Take 1 capsule (40 mg total) by mouth daily. Take twice a day for 6 weeks, then daily thereafter (Patient not taking: Reported on 10/06/2022) 90 capsule 3   pramipexole (MIRAPEX) 1 MG tablet TAKE 1 TABLET BY MOUTH AT BEDTIME 90 tablet 1   Spacer/Aero Chamber Mouthpiece MISC 1 Act by Does not apply route 2 (two) times daily. 1 each 11   SYRINGE-NEEDLE, DISP, 3 ML (B-D SYRINGE/NEEDLE 3CC/23GX1") 23G X 1" 3 ML MISC Use to inject B12 injection monthly. (Patient not taking: Reported on 08/14/2022) 50 each 0   Ubrogepant (UBRELVY) 100 MG TABS Take 1 tablet by mouth daily as needed. (Patient not taking: Reported on 08/14/2022) 30 tablet 1   Current Facility-Administered Medications  Medication Dose Route Frequency Provider Last Rate Last Admin   0.9 %  sodium chloride infusion  500 mL Intravenous Once Ivyanna Sibert, Willaim Rayas, MD        Allergies as of 10/06/2022 - Review Complete 10/06/2022  Allergen Reaction Noted   Dulera [mometasone furo-formoterol fum]  04/06/2013   Imitrex [sumatriptan] Other (See Comments) 06/20/2020   Prilosec [omeprazole] Other (See Comments) 08/14/2022   Shellfish allergy Rash 03/18/2022    Family History  Problem Relation Age of Onset   Arthritis Mother    Hypertension Mother    Arthritis/Rheumatoid Mother    Depression Father    Heart disease Father    Hypertension Father    Psoriasis Sister    Lupus Sister    Depression Brother    Asthma  Neg Hx    Alcohol abuse Neg Hx    Cancer Neg Hx    COPD Neg Hx    Diabetes Neg Hx    Drug abuse Neg Hx    Hyperlipidemia Neg Hx    Kidney disease Neg Hx    Stroke Neg Hx    Colon cancer Neg Hx    Esophageal cancer Neg Hx    Stomach cancer Neg Hx    Rectal cancer Neg Hx     Social History   Socioeconomic History   Marital status: Media planner    Spouse name: Not on file   Number of children: Not on  file   Years of education: Not on file   Highest education level: Not on file  Occupational History   Not on file  Tobacco Use   Smoking status: Former   Smokeless tobacco: Never  Vaping Use   Vaping Use: Never used  Substance and Sexual Activity   Alcohol use: Yes    Alcohol/week: 3.0 standard drinks of alcohol    Types: 3 Glasses of wine per week    Comment: occ   Drug use: Not Currently   Sexual activity: Not Currently    Partners: Male  Other Topics Concern   Not on file  Social History Narrative   Not on file   Social Determinants of Health   Financial Resource Strain: Not on file  Food Insecurity: Not on file  Transportation Needs: Not on file  Physical Activity: Not on file  Stress: Not on file  Social Connections: Not on file  Intimate Partner Violence: Not on file    Review of Systems: All other review of systems negative except as mentioned in the HPI.  Physical Exam: Vital signs BP 134/79   Pulse 70   Temp (!) 97.5 F (36.4 C)   Ht  (1.778 m)   Wt 195 lb (88.5 kg)   SpO2 99%   BMI 27.98 kg/m   General:   Alert,  Well-developed, pleasant and cooperative in NAD Lungs:  Clear throughout to auscultation.   Heart:  Regular rate and rhythm Abdomen:  Soft, nontender and nondistended.   Neuro/Psych:  Alert and cooperative. Normal mood and affect. A and O x 3  Harlin Rain, MD [facility]

## 2022-10-06 NOTE — Progress Notes (Signed)
Called to room to assist during endoscopic procedure.  Patient ID and intended procedure confirmed with present staff. Received instructions for my participation in the procedure from the performing physician.  

## 2022-10-06 NOTE — Progress Notes (Signed)
Sedate, gd SR, tolerated procedure well, VSS, report to RN 

## 2022-10-06 NOTE — Op Note (Signed)
[facility] Patient Name: Derek Tucker Procedure Date: 10/06/2022 9:55 AM MRN: [id number] Endoscopist: Viviann Spare P. Adela Lank , MD, [id number] Age: 52 Referring MD:  Date of Birth: [birth year] Gender: Male Account #: [id number] Procedure:                Upper GI endoscopy Indications:              Dysphagia, follow up esophagitis - moderate to                            severe esophagitis noted on prior EGD with distal                            esophageal stricture. Previously intolerant of                            omeprazole, now on protonix and took 40mg  BID for 1                            month and now once daily. Tolerated it, symptoms                            improved. Rule out Barrett's, dilate esophageal                            stricture. Prior biopsies rule out EoE. He is                            interested in alternative options for treatment of                            reflux and hoping to come off PPI Medicines:                Monitored Anesthesia Care Procedure:                Pre-Anesthesia Assessment:                           - Prior to the procedure, a History and Physical                            was performed, and patient medications and                            allergies were reviewed. The patient's tolerance of                            previous anesthesia was also reviewed. The risks                            and benefits of the procedure and the sedation                            options and risks were discussed with the patient.  All questions were answered, and informed consent                            was obtained. Prior Anticoagulants: The patient has                            taken no anticoagulant or antiplatelet agents. ASA                            Grade Assessment: II - A patient with mild systemic                            disease. After reviewing the risks and benefits,                             the patient was deemed in satisfactory condition to                            undergo the procedure.                           After obtaining informed consent, the endoscope was                            passed under direct vision. Throughout the                            procedure, the patient's blood pressure, pulse, and                            oxygen saturations were monitored continuously. The                            GIF [id number] #[id number] was introduced through the                            mouth, and advanced to the second part of duodenum.                            The upper GI endoscopy was accomplished without                            difficulty. The patient tolerated the procedure                            well. Scope In: Scope Out: Findings:                 Esophagogastric landmarks were identified: the                            Z-line was found at 40 cm, the gastroesophageal                            junction was found  at 40 cm and the upper extent of                            the gastric folds was found at 40 cm from the                            incisors. Z line just slightly irregular - does not                            meet criteria of Barrett's. Interval healing of                            esophagitis.                           The gastroesophageal flap valve was visualized                            endoscopically and classified as Hill Grade I                            (prominent fold, tight to endoscope).                           One benign-appearing, intrinsic mild stenosis was                            found 40 cm from the incisors. This stenosis                            measured less than one cm (in length). A TTS                            dilator was passed through the scope. Dilation with                            a 16-17-18 mm balloon dilator was performed to 16                            mm and 17 mm after which appropriate  dilation                            effect was seen.                           The exam of the esophagus was otherwise normal.                            Interval healing of esophagitis on PPI.                           The entire examined stomach was normal.  The examined duodenum was normal. Complications:            No immediate complications. Estimated blood loss:                            Minimal. Estimated Blood Loss:     Estimated blood loss was minimal. Impression:               - Interval healing of esophagitis - no Barrett's                           - Esophagogastric landmarks identified.                           - Gastroesophageal flap valve classified as Hill                            Grade I (prominent fold, tight to endoscope).                           - Benign-appearing esophageal stenosis. Dilated to                            17mm with good result.                           - Normal stomach.                           - Normal examined duodenum.                           Overall, patient has responded quite well to                            protonix. Fortunately no Barrett's esophagus. Based                            on anatomy I think he is an excellent candidate for                            TIF if he wants to come off PPI. Will discuss with                            him and refer to Dr. Barron Alvine if he is                            interested. Continue protonix until then. Recommendation:           - Patient has a contact number available for                            emergencies. The signs and symptoms of potential                            delayed complications were discussed with the  patient. Return to normal activities tomorrow.                            Written discharge instructions were provided to the                            patient.                           - Post dilation diet                            - Continue present medications.                           - Continue protonix                           - Referral to Dr. Barron Alvine if the patient wishes                            to consider a TIF evaluation Viviann Spare P. Adela Lank, MD 10/06/2022 10:28:05 AM This report has been signed electronically.

## 2022-10-06 NOTE — Progress Notes (Signed)
Pt's states no medical or surgical changes since previsit or office visit. 

## 2022-10-07 ENCOUNTER — Telehealth: Payer: Self-pay

## 2022-10-07 NOTE — Telephone Encounter (Signed)
-----   Message from [facility] V, DO sent at 10/06/2022  4:39 PM EDT ----- Regarding: RE: TIF Consult Agree, looks like he would be a great candidate for TIF.  Thanks for sending him over!  Tangee Marszalek, please schedule OV with me to discuss TIF.  Thanks.  Vito  ----- Message ----- From: Benancio Deeds, MD Sent: 10/06/2022  11:49 AM EDT To: Shellia Cleverly, DO; Missy Sabins, RN Subject: TIF Consult                                    Vito I have a patient who is very interested in TIF and I think a good candidate. He has had severe esophagitis while off PPI, no hiatal hernia, Hill grade I. He really prefers to be off PPI, although he healed up nicely on it. Okay to schedule a visit with you in the office to discuss? Thanks  [place] if so can you please coordinate?  Brett Canales

## 2022-10-07 NOTE — Telephone Encounter (Signed)
Called and spoke with patient. He has been scheduled for an office visit with Dr. Barron Alvine on 01/05/23 at 1:40 pm. Pt will call back periodically to see if there have been any cancellations. Pt verbalized understanding and had no concerns at the end of the call.

## 2022-10-07 NOTE — Telephone Encounter (Signed)
Attempted f/u call. No answer, left VM. 

## 2022-10-14 ENCOUNTER — Ambulatory Visit (INDEPENDENT_AMBULATORY_CARE_PROVIDER_SITE_OTHER): Payer: Commercial Managed Care - HMO | Admitting: Gastroenterology

## 2022-10-14 ENCOUNTER — Encounter: Payer: Self-pay | Admitting: Gastroenterology

## 2022-10-14 VITALS — BP 120/80 | HR 62 | Ht 71.0 in | Wt 194.0 lb

## 2022-10-14 DIAGNOSIS — K641 Second degree hemorrhoids: Secondary | ICD-10-CM | POA: Diagnosis not present

## 2022-10-14 DIAGNOSIS — K219 Gastro-esophageal reflux disease without esophagitis: Secondary | ICD-10-CM | POA: Diagnosis not present

## 2022-10-14 NOTE — Patient Instructions (Addendum)
HEMORRHOID BANDING PROCEDURE    FOLLOW-UP CARE   The procedure you have had should have been relatively painless since the banding of the area involved does not have nerve endings and there is no pain sensation.  The rubber band cuts off the blood supply to the hemorrhoid and the band may fall off as soon as 48 hours after the banding (the band may occasionally be seen in the toilet bowl following a bowel movement). You may notice a temporary feeling of fullness in the rectum which should respond adequately to plain Tylenol or Motrin.  Following the banding, avoid strenuous exercise that evening and resume full activity the next day.  A sitz bath (soaking in a warm tub) or bidet is soothing, and can be useful for cleansing the area after bowel movements.     To avoid constipation, take two tablespoons of natural wheat bran, natural oat bran, flax, Benefiber or any over the counter fiber supplement and increase your water intake to 7-8 glasses daily.    Unless you have been prescribed anorectal medication, do not put anything inside your rectum for two weeks: No suppositories, enemas, fingers, etc.  Occasionally, you may have more bleeding than usual after the banding procedure.  This is often from the untreated hemorrhoids rather than the treated one.  Don't be concerned if there is a tablespoon or so of blood.  If there is more blood than this, lie flat with your bottom higher than your head and apply an ice pack to the area. If the bleeding does not stop within a half an hour or if you feel faint, call our office at [phone] or go to the emergency room.  Problems are not common; however, if there is a substantial amount of bleeding, severe pain, chills, fever or difficulty passing urine (very rare) or other problems, you should call us at [phone] or report to the nearest emergency room.  Do not stay seated continuously for more than 2-3 hours for a day or two after the procedure.   Tighten your buttock muscles 10-15 times every two hours and take 10-15 deep breaths every 1-2 hours.  Do not spend more than a few minutes on the toilet if you cannot empty your bowel; instead re-visit the toilet at a later time.   You have been scheduled for a 2nd hemorrhoid banding appointment on Thursday, 5-30 at 11:00 am. Please arrive 10 minutes early for registration.  Please purchase the following medications over the counter and take as directed: Metamucil - take once daily  Thank you for entrusting me with your care and for choosing [facility], Dr. Ileene Patrick

## 2022-10-14 NOTE — Progress Notes (Signed)
HPI :  52 year old male here for follow-up visit for hemorrhoids and also discuss history of GERD with esophagitis.  He had a colonoscopy in March 2021 with Dr. Jeani Hawking.  1 small polyp removed, told to repeat in 7 years.  Diverticulosis noted, no other concerning pathology.  Patient reports has been experiencing symptomatic hemorrhoids for years.  He has occasional flares of itching and discomfort.  Is been bothering him more so over the past 4 to 6 months.  A friend had rectal cancer and he was concerned about that.  He is interested in pursuing therapy for hemorrhoids.  He has what sounds like grade 2 prolapse, occasional bleeding on the toilet paper, burning with itching.  He has occasional rare straining.  He generally does not have much hard stools that he passes.  He has tried using some Metamucil recently which helps keep stools soft and he appears to tolerate it.  Otherwise recall he had a history of dysphagia in the past which led to endoscopies as outlined below.  He had a stricture at the GEJ initially in the setting of LA grade C esophagitis, could not dilate it due to significant inflammation.  Put him on omeprazole initially but he did not tolerated due to headaches and stopped it.  Follow-up EGD showed ongoing active esophagitis and again could not dilate the stricture.  We transitioned him to pantoprazole 40 mg which he tolerated quite well and follow-up surveillance endoscopy confirmed mucosal healing and no evidence of Barrett's esophagus.  Distal esophageal stricture was dilated with a good result.  Biopsies of esophagus showed no evidence of EOE.  No hiatal hernia noted, Hill grade 1 views of the cardia.  He was thought to be a good candidate for TIF procedure as he wants to come off PPI, does not want to take medications chronically.  He is scheduled to have this evaluated by Dr. Barron Alvine in July   Prior workup: EGD March 2021 by Dr. Elnoria Howard showed benign-appearing esophageal  stenosis and grade D esophagitis. Passes of the scope dilation of the esophagus.   Colonoscopy 09/12/2019: Dr. Elnoria Howard - 3mm transverse polyp, diverticulosis - repeat in 7 years   EGD 06/04/22: - Esophagogastric landmarks were identified: the Z-line was found at 40 cm, the gastroesophageal junction was found at 40 cm and the upper extent of the gastric folds was found at 40 cm from the incisors. Findings: - LA Grade C esophagitis was found in the mid to distal esophagus, with ulceration at the GEJ - One benign-appearing, intrinsic moderate to severe stenosis was found at the GEJ. The upper endoscope passed with mild resistance, which dilated the stricture itself, causing appropriate mucosal wrents. No further dilation was performed given this finding and inflammatory changes. - The exam of the esophagus was otherwise normal. - The entire examined stomach was normal. - The examined duodenum was normal  Dysphagia, Follow-up of reflux esophagitis - last exam 06/04/22 - LA grade C esophagitis with severe GEJ stricture. Recommended omeprazole. Patient has not been able to tolerate it due to causing headaches. Has made changes in diet, lost weight, with improvement in symptoms but not on any medical therapy currently  EGD 08/14/22: - LA Grade C esophagitis was found in the mid to distal esophagus. Possible short segment of Barrett's near the GEJ but hard to tell in light of inflammatory changes Findings: - One benign-appearing, intrinsic stenosis was found at the GEJ - passage with the endoscope dilated it slightly but lumen was  more patent than the last exam. - Mucosal changes including ringed esophagus and longitudinal furrows were found in the mid esophagus. Biopsies were obtained from the proximal and distal esophagus with cold forceps to rule out eosinophilic esophagitis. - The exam of the esophagus was otherwise normal. - Food (residue) was found in the gastric body prohibiting views of the stomach. Once this  was noted the procedure was aborted, full exam not performed.   Surgical [P], esophageal bx SQUAMOUS MUCOSA WITH FOCAL INTRAEPITHELIAL EOSINOPHILS. FOCALLY UP TO 9 PER HIGH-POWER FIELD.  Switched to pantoprazole 40mg  twice daily   Gastric emptying study 09/10/22 - normal  EGD 10/06/22: - Esophagogastric landmarks were identified: the Z-line was found at 40 cm, the gastroesophageal junction was found at 40 cm and the upper extent of the gastric folds was found at 40 cm from the incisors. Z line just slightly irregular - does not meet criteria of Barrett's. Interval healing of esophagitis. Findings: - The gastroesophageal flap valve was visualized endoscopically and classified as Hill Grade I (prominent fold, tight to endoscope). - One benign-appearing, intrinsic mild stenosis was found 40 cm from the incisors. This stenosis measured less than one cm (in length). A TTS dilator was passed through the scope. Dilation with a 16-17-18 mm balloon dilator was performed to 16 mm and 17 mm after which appropriate dilation effect was seen. - The exam of the esophagus was otherwise normal. Interval healing of esophagitis on PPI. - The entire examined stomach was normal. - The examined duodenum was normal.   Past Medical History:  Diagnosis Date   Allergy    Anxiety    Arthritis    Asthma    Depression    GERD (gastroesophageal reflux disease)    History of gallstones      Past Surgical History:  Procedure Laterality Date   APPENDECTOMY  06/15/1980   CHOLECYSTECTOMY  2017   HERNIA REPAIR N/A 2005   Umbilical   KNEE ARTHROSCOPY  06/15/2010   TONSILLECTOMY  08/28/2021   Family History  Problem Relation Age of Onset   Arthritis Mother    Hypertension Mother    Arthritis/Rheumatoid Mother    Depression Father    Heart disease Father    Hypertension Father    Psoriasis Sister    Lupus Sister    Depression Brother    Asthma Neg Hx    Alcohol abuse Neg Hx    Cancer Neg Hx    COPD Neg Hx     Diabetes Neg Hx    Drug abuse Neg Hx    Hyperlipidemia Neg Hx    Kidney disease Neg Hx    Stroke Neg Hx    Colon cancer Neg Hx    Esophageal cancer Neg Hx    Stomach cancer Neg Hx    Rectal cancer Neg Hx    Social History   Tobacco Use   Smoking status: Former   Smokeless tobacco: Never  Vaping Use   Vaping Use: Never used  Substance Use Topics   Alcohol use: Yes    Alcohol/week: 3.0 standard drinks of alcohol    Types: 3 Glasses of wine per week    Comment: occ   Drug use: Not Currently   Current Outpatient Medications  Medication Sig Dispense Refill   albuterol (VENTOLIN HFA) 108 (90 Base) MCG/ACT inhaler INHALE 2 PUFFS BY MOUTH EVERY 6 HOURS AS NEEDED FOR WHEEZING OR SHORTNESS OF BREATH 18 g 3   cyanocobalamin (VITAMIN B12) 1000 MCG/ML injection  INJECT 1 ML INTRAMUSCULARLY  ONCE EVERY MONTH 1 mL 0   EPINEPHrine 0.3 mg/0.3 mL IJ SOAJ injection      escitalopram (LEXAPRO) 20 MG tablet Take 20 mg by mouth daily.     Esketamine HCl (SPRAVATO, 56 MG DOSE, NA) Place into the nose. Take 1 spray every 2 weeks. Administered in office.     fish oil-omega-3 fatty acids 1000 MG capsule Take 2 g by mouth daily.     fluticasone (FLONASE) 50 MCG/ACT nasal spray Place 4 sprays into the nose daily. 16 g 11   fluticasone (FLOVENT HFA) 110 MCG/ACT inhaler Inhale 1 puff into the lungs 2 times daily as needed. 12 g 5   omeprazole (PRILOSEC) 40 MG capsule Take 1 capsule (40 mg total) by mouth daily. Take twice a day for 6 weeks, then daily thereafter 90 capsule 3   pantoprazole (PROTONIX) 40 MG tablet Take 1 tablet (40 mg total) by mouth 2 (two) times daily. 90 tablet 3   pramipexole (MIRAPEX) 1 MG tablet TAKE 1 TABLET BY MOUTH AT BEDTIME 90 tablet 1   Spacer/Aero Chamber Mouthpiece MISC 1 Act by Does not apply route 2 (two) times daily. 1 each 11   SYRINGE-NEEDLE, DISP, 3 ML (B-D SYRINGE/NEEDLE 3CC/23GX1") 23G X 1" 3 ML MISC Use to inject B12 injection monthly. 50 each 0   Ubrogepant  (UBRELVY) 100 MG TABS Take 1 tablet by mouth daily as needed. 30 tablet 1   No current facility-administered medications for this visit.   Allergies  Allergen Reactions   Dulera [Mometasone Furo-Formoterol Fum]     headache   Imitrex [Sumatriptan] Other (See Comments)    Chest pain and numbness   Prilosec [Omeprazole] Other (See Comments)    Headache   Shellfish Allergy Rash    GI intolerance     Review of Systems: All systems reviewed and negative except where noted in HPI.   Lab Results  Component Value Date   WBC 7.3 06/05/2022   HGB 15.8 06/05/2022   HCT 46.2 06/05/2022   MCV 88.1 06/05/2022   PLT 314.0 06/05/2022    Lab Results  Component Value Date   CREATININE 0.99 04/23/2022   BUN 6 04/23/2022   NA 137 04/23/2022   K 4.1 04/23/2022   CL 101 04/23/2022   CO2 28 04/23/2022    Lab Results  Component Value Date   ALT 30 04/23/2022   AST 25 04/23/2022   ALKPHOS 55 04/23/2022   BILITOT 1.1 04/23/2022     Physical Exam: BP 120/80   Pulse 62   Ht 5\' 11"  (1.803 m)   Wt 194 lb (88 kg)   BMI 27.06 kg/m  Constitutional: Pleasant,well-developed, male in no acute distress. Neurological: Alert and oriented to person place and time. Psychiatric: Normal mood and affect. Behavior is normal.   ASSESSMENT: 52 y.o. male here for assessment of the following  1. Grade II hemorrhoids   2. Gastroesophageal reflux disease, unspecified whether esophagitis present    Ongoing grade 2 hemorrhoids over time.  Colonoscopy up-to-date and reassuring.  DRE and anoscopy shows no polypoid or mass lesions, but internal hemorrhoids noted in all areas, most inflamed in the RP area.  We discussed options for treatment of hemorrhoids, I think he is an excellent candidate for banding.  We discussed risks and benefits as outlined below, ultimately he wished to proceed with banding and RP hemorrhoid banded today.  Procedural note as outlined below.  Continue Metamucil daily to keep  stools soft, follow-up in a few weeks for reassessment and additional banding.  Otherwise reviewed his history of reflux with distal esophageal stricture, off PPI has had significant esophagitis, he has very responded quite well to Protonix with mucosal healing but does not want to be on PPI chronically and we discussed alternatives, specifically TIF.  I think he is an excellent candidate for TIF, he has been referred to see Dr. Barron Alvine for this in July.  He plans on pursuing this if he is thought to be a good candidate by Dr. Barron Alvine  PLAN: - RP hemorrhoid banded as below - Metamucil daily - f/u in a few weeks for reassessment and additional banding - continue PPI for now to keep esophagus healed pending TIF evaluation - seeing Dr. Barron Alvine in July for TIF evaluation  Harlin Rain, MD [facility]   PROCEDURE NOTE: The patient presents with symptomatic grade II  hemorrhoids, requesting rubber band ligation of his/her hemorrhoidal disease.  All risks, benefits and alternative forms of therapy were described and informed consent was obtained. CMA Lucius Conn as standby for this exam.  In the Left Lateral Decubitus position anoscopic examination revealed grade II hemorrhoids in all position(s), most prominent in RP area.  The anorectum was pre-medicated with 0.125% nitroglycerin The decision was made to band the RP internal hemorrhoid, and the [facility] System was used to perform band ligation without complication.  Digital anorectal examination was then performed to assure proper positioning of the band, and to adjust the banded tissue as required.  The patient was discharged home without pain or other issues.  Dietary and behavioral recommendations were given and along with follow-up instructions.     The following adjunctive treatments were recommended: Continue Metamucil daily  The patient will return in 2-4 weks for  follow-up and possible additional banding as  required. No complications were encountered and the patient tolerated the procedure well.

## 2022-10-22 ENCOUNTER — Other Ambulatory Visit: Payer: Self-pay

## 2022-10-29 ENCOUNTER — Ambulatory Visit: Payer: Commercial Managed Care - HMO | Admitting: Gastroenterology

## 2022-11-02 ENCOUNTER — Other Ambulatory Visit: Payer: Self-pay | Admitting: Internal Medicine

## 2022-11-03 ENCOUNTER — Encounter: Payer: Self-pay | Admitting: Internal Medicine

## 2022-11-03 ENCOUNTER — Encounter: Payer: Self-pay | Admitting: Gastroenterology

## 2022-11-03 ENCOUNTER — Other Ambulatory Visit: Payer: Self-pay

## 2022-11-03 DIAGNOSIS — R1310 Dysphagia, unspecified: Secondary | ICD-10-CM

## 2022-11-03 MED ORDER — PANTOPRAZOLE SODIUM 40 MG PO TBEC
40.0000 mg | DELAYED_RELEASE_TABLET | Freq: Every day | ORAL | 1 refills | Status: DC
Start: 2022-11-03 — End: 2023-11-09

## 2022-11-03 NOTE — Progress Notes (Signed)
Patient requested refill of pantoprazole 40 mg qd. Sent to pharmacy per last OV 10-14-22

## 2022-11-12 ENCOUNTER — Encounter: Payer: Self-pay | Admitting: Gastroenterology

## 2022-11-12 ENCOUNTER — Ambulatory Visit (INDEPENDENT_AMBULATORY_CARE_PROVIDER_SITE_OTHER): Payer: Commercial Managed Care - HMO | Admitting: Gastroenterology

## 2022-11-12 VITALS — BP 120/80 | HR 80 | Ht 70.0 in | Wt 205.5 lb

## 2022-11-12 DIAGNOSIS — K641 Second degree hemorrhoids: Secondary | ICD-10-CM | POA: Diagnosis not present

## 2022-11-12 NOTE — Patient Instructions (Signed)
HEMORRHOID BANDING PROCEDURE    FOLLOW-UP CARE   The procedure you have had should have been relatively painless since the banding of the area involved does not have nerve endings and there is no pain sensation.  The rubber band cuts off the blood supply to the hemorrhoid and the band may fall off as soon as 48 hours after the banding (the band may occasionally be seen in the toilet bowl following a bowel movement). You may notice a temporary feeling of fullness in the rectum which should respond adequately to plain Tylenol or Motrin.  Following the banding, avoid strenuous exercise that evening and resume full activity the next day.  A sitz bath (soaking in a warm tub) or bidet is soothing, and can be useful for cleansing the area after bowel movements.     To avoid constipation, take two tablespoons of natural wheat bran, natural oat bran, flax, Benefiber or any over the counter fiber supplement and increase your water intake to 7-8 glasses daily.    Unless you have been prescribed anorectal medication, do not put anything inside your rectum for two weeks: No suppositories, enemas, fingers, etc.  Occasionally, you may have more bleeding than usual after the banding procedure.  This is often from the untreated hemorrhoids rather than the treated one.  Don't be concerned if there is a tablespoon or so of blood.  If there is more blood than this, lie flat with your bottom higher than your head and apply an ice pack to the area. If the bleeding does not stop within a half an hour or if you feel faint, call our office at [phone] or go to the emergency room.  Problems are not common; however, if there is a substantial amount of bleeding, severe pain, chills, fever or difficulty passing urine (very rare) or other problems, you should call us at [phone] or report to the nearest emergency room.  Do not stay seated continuously for more than 2-3 hours for a day or two after the procedure.   Tighten your buttock muscles 10-15 times every two hours and take 10-15 deep breaths every 1-2 hours.  Do not spend more than a few minutes on the toilet if you cannot empty your bowel; instead re-visit the toilet at a later time.    You have been scheduled for a hemorrhoid banding appointment on 01-25-23 at 3:40pm. Please arrive 10 minutes early for registration.  If you need to cancel or reschedule this appointment please call [phone] as soon as possible. Thank you.  Thank you for entrusting me with your care and for choosing [facility], Dr. Ileene Patrick    If your blood pressure at your visit was 140/90 or greater, please contact your primary care physician to follow up on this. ______________________________________________________  If you are age 54 or older, your body mass index should be between 23-30. Your Body mass index is 29.49 kg/m. If this is out of the aforementioned range listed, please consider follow up with your Primary Care Provider.  If you are age 1 or younger, your body mass index should be between 19-25. Your Body mass index is 29.49 kg/m. If this is out of the aformentioned range listed, please consider follow up with your Primary Care Provider.  ________________________________________________________  The [place] [facility] providers would like to encourage you to use [facility] to communicate with providers for non-urgent requests or questions.  Due to long hold times on the telephone, sending your provider a  message by [facility] may be a faster and more efficient way to get a response.  Please allow 48 business hours for a response.  Please remember that this is for non-urgent requests.  _______________________________________________________  Due to recent changes in healthcare laws, you may see the results of your imaging and laboratory studies on MyChart before your provider has had a chance to review them.  We understand that in some cases there may be  results that are confusing or concerning to you. Not all laboratory results come back in the same time frame and the provider may be waiting for multiple results in order to interpret others.  Please give [place] 48 hours in order for your provider to thoroughly review all the results before contacting the office for clarification of your results.

## 2022-11-12 NOTE — Progress Notes (Signed)
52 year old male here for follow-up visit for hemorrhoids / hemorrhoid banding.   He had a colonoscopy in March 2021 with Dr. Jeani Hawking.  1 small polyp removed, told to repeat in 7 years.  Diverticulosis noted, no other concerning pathology.  Patient reports has been experiencing symptomatic hemorrhoids for years.  He has occasional flares of itching and discomfort. He has grade 2 prolapse, occasional bleeding on the toilet paper, burning with itching.  He has occasional rare straining.  He generally does not have much hard stools that he passes.  He has tried using some Metamucil recently which helps keep stools soft and he appears to tolerate it.  First banding done on RP hemorrhoid on 10/14/22, he tolerated it well without any pain or bleeding.  He thinks it is helping and he feels less hemorrhoid symptoms since then.  He continues to take fiber.  He is wanting to proceed with additional banding today   PROCEDURE NOTE: The patient presents with symptomatic grade II  hemorrhoids, requesting rubber band ligation of his/her hemorrhoidal disease.  All risks, benefits and alternative forms of therapy were described and informed consent was obtained.   The anorectum was pre-medicated with 0.125% nitroglycerin The decision was made to band the LL internal hemorrhoid, and the [facility] was used to perform band ligation without complication.  Digital anorectal examination was then performed to assure proper positioning of the band, and to adjust the banded tissue as required.  The patient was discharged home without pain or other issues.  Dietary and behavioral recommendations were given and along with follow-up instructions.     The following adjunctive treatments were recommended: Continue daily fiber supplement  The patient will return in 2-4 weeks for  follow-up and possible additional banding as required. No complications were encountered and the patient tolerated the procedure  well.  Harlin Rain, MD [facility]

## 2022-11-28 ENCOUNTER — Other Ambulatory Visit: Payer: Self-pay | Admitting: Internal Medicine

## 2022-12-04 ENCOUNTER — Other Ambulatory Visit: Payer: Self-pay | Admitting: Internal Medicine

## 2022-12-04 DIAGNOSIS — F331 Major depressive disorder, recurrent, moderate: Secondary | ICD-10-CM

## 2022-12-22 ENCOUNTER — Ambulatory Visit: Payer: Commercial Managed Care - HMO | Admitting: Gastroenterology

## 2023-01-05 ENCOUNTER — Ambulatory Visit: Payer: Commercial Managed Care - HMO | Admitting: Gastroenterology

## 2023-01-13 ENCOUNTER — Other Ambulatory Visit: Payer: Self-pay | Admitting: Internal Medicine

## 2023-01-13 DIAGNOSIS — J4530 Mild persistent asthma, uncomplicated: Secondary | ICD-10-CM

## 2023-01-25 ENCOUNTER — Encounter: Payer: Commercial Managed Care - HMO | Admitting: Gastroenterology

## 2023-01-30 ENCOUNTER — Other Ambulatory Visit: Payer: Self-pay | Admitting: Internal Medicine

## 2023-02-18 ENCOUNTER — Ambulatory Visit (INDEPENDENT_AMBULATORY_CARE_PROVIDER_SITE_OTHER): Payer: Commercial Managed Care - HMO | Admitting: Nurse Practitioner

## 2023-02-18 ENCOUNTER — Encounter: Payer: Self-pay | Admitting: Nurse Practitioner

## 2023-02-18 VITALS — BP 116/72 | HR 70 | Ht 71.0 in | Wt 200.0 lb

## 2023-02-18 DIAGNOSIS — K2100 Gastro-esophageal reflux disease with esophagitis, without bleeding: Secondary | ICD-10-CM

## 2023-02-18 NOTE — Progress Notes (Signed)
ASSESSMENT & PLAN   52 y.o.  male known to Dr. Adela Lank   GERD / hx of severe esophagitis with GEJ stricture.Derek Tucker No pyrosis but has persistent regurgitation. Doesn't want to be on lifelong PPI. Supposed to be evaluated for TIF.procedure --Had consultation scheduled with Dr. Barron Alvine for TIF evaluation. Our office had to cancel appt. Patient was rescheduled but with me, not Dr. Barron Alvine ( schedulers didn't realize that TIFevaluation can only be done by Dr. Barron Alvine)    --Continue anti-reflux precautions.  --I apologized for error in scheduling. I discussed situation with Dr. Barron Alvine who will work him in for a TIF evaluation  on 9/10 at 1:40 pm.    Previous GI Endoscopies / Labs / Imaging   **May not include all endoscopic evaluations   He had a colonoscopy in March 2021 with Dr. Jeani Hawking. 1 small polyp removed, told to repeat in 7 years.  EGD 08/14/22: - LA Grade C esophagitis was found in the mid to distal esophagus. Possible short segment of Barrett's near the GEJ but hard to tell in light of inflammatory changes Findings: - One benign-appearing, intrinsic stenosis was found at the GEJ - passage with the endoscope dilated it slightly but lumen was more patent than the last exam. - Mucosal changes including ringed esophagus and longitudinal furrows were found in the mid esophagus. Biopsies were obtained from the proximal and distal esophagus with cold forceps to rule out eosinophilic esophagitis. - The exam of the esophagus was otherwise normal. - Food (residue) was found in the gastric body prohibiting views of the stomach. Once this was noted the procedure was aborted, full exam not performed.    Surgical [P], esophageal bx SQUAMOUS MUCOSA WITH FOCAL INTRAEPITHELIAL EOSINOPHILS. FOCALLY UP TO 9 PER HIGH-POWER FIELD.   Gastric emptying study 09/10/22 - normal   EGD 10/06/22: - Esophagogastric landmarks were identified: the Z-line was found at 40 cm, the gastroesophageal  junction was found at 40 cm and the upper extent of the gastric folds was found at 40 cm from the incisors. Z line just slightly irregular - does not meet criteria of Barrett's. Interval healing of esophagitis. Findings: - The gastroesophageal flap valve was visualized endoscopically and classified as Hill Grade I (prominent fold, tight to endoscope). - One benign-appearing, intrinsic mild stenosis was found 40 cm from the incisors. This stenosis measured less than one cm (in length). A TTS dilator was passed through the scope. Dilation with a 16-17-18 mm balloon dilator was performed to 16 mm and 17 mm after which appropriate dilation effect was seen. - The exam of the esophagus was otherwise normal. Interval healing of esophagitis on PPI. - The entire examined stomach was normal. - The examined duodenum was normal.      Latest Ref Rng & Units 04/23/2022    9:40 AM 02/14/2021   12:55 PM 06/20/2020    3:56 PM  Hepatic Function  Total Protein 6.0 - 8.3 g/dL 7.5  7.1  7.9   Albumin 3.5 - 5.2 g/dL 4.9  4.5  5.0   AST 0 - 37 U/L 25  22  24    ALT 0 - 53 U/L 30  21  27    Alk Phosphatase 39 - 117 U/L 55  52  50   Total Bilirubin 0.2 - 1.2 mg/dL 1.1  1.3  1.1   Bilirubin, Direct 0.0 - 0.3 mg/dL 0.2  0.2  0.2        Latest Ref Rng & Units  06/05/2022   10:34 AM 04/23/2022    9:40 AM 07/09/2021   10:10 AM  CBC  WBC 4.0 - 10.5 K/uL 7.3  6.8  6.4   Hemoglobin 13.0 - 17.0 g/dL 75.6  43.3  29.5   Hematocrit 39.0 - 52.0 % 46.2  44.1  43.9   Platelets 150.0 - 400.0 K/uL 314.0  302.0  295.0      Past Medical History:  Diagnosis Date   Allergy    Anxiety    Arthritis    Asthma    Depression    GERD (gastroesophageal reflux disease)    History of gallstones     Past Surgical History:  Procedure Laterality Date   APPENDECTOMY  06/15/1980   CHOLECYSTECTOMY  2017   HERNIA REPAIR N/A 2005   Umbilical   KNEE ARTHROSCOPY  06/15/2010   TONSILLECTOMY  08/28/2021    Family History  Problem Relation  Age of Onset   Arthritis Mother    Hypertension Mother    Arthritis/Rheumatoid Mother    Depression Father    Heart disease Father    Hypertension Father    Psoriasis Sister    Lupus Sister    Depression Brother    Asthma Neg Hx    Alcohol abuse Neg Hx    Cancer Neg Hx    COPD Neg Hx    Diabetes Neg Hx    Drug abuse Neg Hx    Hyperlipidemia Neg Hx    Kidney disease Neg Hx    Stroke Neg Hx    Colon cancer Neg Hx    Esophageal cancer Neg Hx    Stomach cancer Neg Hx    Rectal cancer Neg Hx     Current Medications, Allergies, Family History and Social History were reviewed in [employer] electronic medical record.     Current Outpatient Medications  Medication Sig Dispense Refill   albuterol (VENTOLIN HFA) 108 (90 Base) MCG/ACT inhaler INHALE 2 PUFFS BY MOUTH EVERY 6 HOURS AS NEEDED FOR WHEEZING OR SHORTNESS OF BREATH 18 g 3   cyanocobalamin (VITAMIN B12) 1000 MCG/ML injection INJECT 1 ML INTRAMUSCULARLY ONCE EVERY MONTH 1 mL 0   EPINEPHrine 0.3 mg/0.3 mL IJ SOAJ injection  (Patient not taking: Reported on 11/12/2022)     escitalopram (LEXAPRO) 20 MG tablet Take 20 mg by mouth daily.     fish oil-omega-3 fatty acids 1000 MG capsule Take 2 g by mouth daily.     fluticasone (FLONASE) 50 MCG/ACT nasal spray Place 4 sprays into the nose daily. 16 g 11   fluticasone (FLOVENT HFA) 110 MCG/ACT inhaler Inhale 1 puff into the lungs 2 times daily as needed. 12 g 5   lidocaine-prilocaine (EMLA) cream Apply 1 Application topically as needed.     pantoprazole (PROTONIX) 40 MG tablet Take 1 tablet (40 mg total) by mouth daily. 90 tablet 1   pramipexole (MIRAPEX) 1 MG tablet TAKE 1 TABLET BY MOUTH AT BEDTIME 90 tablet 1   Spacer/Aero Chamber Mouthpiece MISC 1 Act by Does not apply route 2 (two) times daily. 1 each 11   SPRAVATO, 84 MG DOSE, 28 MG/DEVICE SOPK Place 1 spray into both nostrils once a week.     SYRINGE-NEEDLE, DISP, 3 ML (B-D SYRINGE/NEEDLE 3CC/23GX1") 23G X 1" 3 ML MISC Use to  inject B12 injection monthly. 50 each 0   Ubrogepant (UBRELVY) 100 MG TABS Take 1 tablet by mouth daily as needed. 30 tablet 1   No current facility-administered medications for this  visit.    Review of Systems: No chest pain. No shortness of breath. No urinary complaints.    Physical Exam  Wt Readings from Last 3 Encounters:  11/12/22 205 lb 8 oz (93.2 kg)  10/14/22 194 lb (88 kg)  10/06/22 195 lb (88.5 kg)    BP 116/72   Pulse 70   Ht 5\' 11"  (1.803 m)   Wt 200 lb (90.7 kg)   SpO2 94%   BMI 27.89 kg/m  Constitutional:  Pleasant, generally well appearing male in no acute distress. Psychiatric: Normal mood and affect. Behavior is normal. Neurological: Alert and oriented to person place and time.   Willette Cluster, NP  02/18/2023, 8:21 AM

## 2023-02-18 NOTE — Patient Instructions (Signed)
We have scheduled you for a follow up with Dr.Cirigliano for 02/23/23 at 1:40 pm  Due to recent changes in healthcare laws, you may see the results of your imaging and laboratory studies on MyChart before your provider has had a chance to review them.  We understand that in some cases there may be results that are confusing or concerning to you. Not all laboratory results come back in the same time frame and the provider may be waiting for multiple results in order to interpret others.  Please give [place] 48 hours in order for your provider to thoroughly review all the results before contacting the office for clarification of your results.   It was a pleasure to see you today!  Thank you for trusting me with your gastrointestinal care!

## 2023-02-18 NOTE — Progress Notes (Signed)
Agree with the assessment and plan as outlined by Willette Cluster, NP.  Thank you for seeing him in the office today and for explaining the confusion.  I am happy to see him in an expedited fashion next week so we can discuss TIF.  Tameika Heckmann, DO, [facility]

## 2023-02-23 ENCOUNTER — Telehealth: Payer: Self-pay | Admitting: *Deleted

## 2023-02-23 ENCOUNTER — Encounter: Payer: Self-pay | Admitting: Gastroenterology

## 2023-02-23 ENCOUNTER — Ambulatory Visit: Payer: Commercial Managed Care - HMO | Admitting: Gastroenterology

## 2023-02-23 VITALS — BP 110/76 | HR 81 | Ht 71.0 in | Wt 203.2 lb

## 2023-02-23 DIAGNOSIS — K2100 Gastro-esophageal reflux disease with esophagitis, without bleeding: Secondary | ICD-10-CM | POA: Diagnosis not present

## 2023-02-23 DIAGNOSIS — R1319 Other dysphagia: Secondary | ICD-10-CM | POA: Diagnosis not present

## 2023-02-23 DIAGNOSIS — K222 Esophageal obstruction: Secondary | ICD-10-CM | POA: Diagnosis not present

## 2023-02-23 DIAGNOSIS — R12 Heartburn: Secondary | ICD-10-CM

## 2023-02-23 DIAGNOSIS — R1110 Vomiting, unspecified: Secondary | ICD-10-CM

## 2023-02-23 NOTE — Telephone Encounter (Signed)
Per Dr Barron Alvine, patient needs TIF at [facility]. Will need endoscopy with balloon dilation of known stricture to allow for passage of larger caliber esophyx device used in TIF.

## 2023-02-23 NOTE — Patient Instructions (Signed)
You have been scheduled for a Barium Esophogram at [facility] Radiology (1st floor of the hospital) on 03/09/23 at 11:00 am . Please arrive 30 minutes prior to your appointment for registration. Make certain not to have anything to eat or drink 3 hours prior to your test. If you need to reschedule for any reason, please contact radiology at [phone] to do so. __________________________________________________________________ A barium swallow is an examination that concentrates on views of the esophagus. This tends to be a double contrast exam (barium and two liquids which, when combined, create a gas to distend the wall of the oesophagus) or single contrast (non-ionic iodine based). The study is usually tailored to your symptoms so a good history is essential. Attention is paid during the study to the form, structure and configuration of the esophagus, looking for functional disorders (such as aspiration, dysphagia, achalasia, motility and reflux) EXAMINATION You may be asked to change into a gown, depending on the type of swallow being performed. A radiologist and radiographer will perform the procedure. The radiologist will advise you of the type of contrast selected for your procedure and direct you during the exam. You will be asked to stand, sit or lie in several different positions and to hold a small amount of fluid in your mouth before being asked to swallow while the imaging is performed .In some instances you may be asked to swallow barium coated marshmallows to assess the motility of a solid food bolus. The exam can be recorded as a digital or video fluoroscopy procedure. POST PROCEDURE It will take 1-2 days for the barium to pass through your system. To facilitate this, it is important, unless otherwise directed, to increase your fluids for the next 24-48hrs and to resume your normal diet.  This test typically takes about 30 minutes to  perform. __________________________________________________________________________________  Bonita Quin will be contacted to schedule TIF and EGD ( 2 weeks prior to your TIF). If you have not heard from our office in 1-2 weeks, please contact office at [phone]  and ask for Dr. Barron Alvine nurse.   Due to recent changes in healthcare laws, you may see the results of your imaging and laboratory studies on MyChart before your provider has had a chance to review them.  We understand that in some cases there may be results that are confusing or concerning to you. Not all laboratory results come back in the same time frame and the provider may be waiting for multiple results in order to interpret others.  Please give [place] 48 hours in order for your provider to thoroughly review all the results before contacting the office for clarification of your results.   _______________________________________________________  If your blood pressure at your visit was 140/90 or greater, please contact your primary care physician to follow up on this.  _______________________________________________________  If you are age 3 or older, your body mass index should be between 23-30. Your Body mass index is 28.35 kg/m. If this is out of the aforementioned range listed, please consider follow up with your Primary Care Provider.  If you are age 14 or younger, your body mass index should be between 19-25. Your Body mass index is 28.35 kg/m. If this is out of the aformentioned range listed, please consider follow up with your Primary Care Provider.   ________________________________________________________  The [place] [facility] providers would like to encourage you to use [facility] to communicate with providers for non-urgent requests or questions.  Due to long hold times on the telephone, sending  your provider a message by [facility] may be a faster and more efficient way to get a response.  Please allow 48 business hours for a response.   Please remember that this is for non-urgent requests.  _______________________________________________________  Thank you for choosing me and [facility].  Dr. Doristine Locks

## 2023-02-23 NOTE — Progress Notes (Signed)
Chief Complaint:    GERD, discuss antireflux surgical options  HPI:    Patient is a 52 y.o. male referred to me by Dr. Adela Lank for evaluation of possible antireflux intervention with Transoral Incisionless Fundoplication (TIF) with a goal to stop or significantly reduce acid suppression therapy.  Longstanding history of GERD c/b erosive esophagitis and peptic stricture.  Reflux sxs for 15+ years. Has had repeat dilations of peptic stricture over the years. Continues to have bothersome regurgitation.  Had headaches with omeprazole so this was stopped.  No issue tolerating pantoprazole.  Has tried [facility] elevation and significant dietary mods in the past without clinical or endoscopic improvement.   Will still have regurgitation. Worse with forward flexion and post prandial. No longer having dysphagia after most recent EGD with dilation.   GERD history: -Index symptoms: Regurgitation, heartburn -Exacerbating features: Tomato based sauces, coffee, overeating bread, supine. Forward flexion -Medications trialed: Omeprazole, pantoprazole -Current medications: Pantoprazole 40 mg daily -Complications: Erosive esophagitis, peptic stricture  GERD evaluation: -Last EGD: 10/06/2022 -Barium esophagram: Ordered today for preoperative assessment -Esophageal Manometry: None -pH/Impedance: N/A -Bravo: N/A - GES: 09/10/2022: Normal  Endoscopic History: - 08/2019: EGD: Benign-appearing esophageal stenosis dilated with endoscope alone, LA Grade D esophagitis - 06/04/2022: EGD: LA Grade C esophagitis, One benign-appearing, intrinsic moderate to severe stenosis was found at the GEJ, dilated with the endoscope alone.  Normal stomach and duodenum. - 08/14/2022: EGD: LA Grade C esophagitis. Possible short segment of Barrett's near the GEJ but hard to tell in light of inflammatory changes Findings: - One benign-appearing, intrinsic stenosis was found at the GEJ - passage with the endoscope dilated it slightly but  lumen was more patent than the last exam. - Mucosal changes including ringed esophagus and longitudinal furrows were found in the mid esophagus. Biopsies negative for eosinophilic esophagitis. Food (residue) was found in the gastric body prohibiting views of the stomach. Once this was noted the procedure was aborted, full exam not performed. - 10/06/2022: EGD: Z line just slightly irregular - does not meet criteria of Barrett's. Interval healing of esophagitis.  Hill grade 1 valve, benign-appearing, intrinsic mild stenosis was found 40 cm from the incisors, dilated with 16 mm and 17 mm TTS balloon after which appropriate dilation effect was seen. - The exam of the esophagus was otherwise normal. Interval healing of esophagitis on PPI. - The entire examined stomach was normal. - The examined duodenum was normal.    GERD-HRQL Questionnaire Score: 6/50, marked "dissatisfied" with current health condition related to reflux     Review of systems:     No chest pain, no SOB, no fevers, no urinary sx   Past Medical History:  Diagnosis Date   Allergy    Anxiety    Arthritis    Asthma    Depression    GERD (gastroesophageal reflux disease)    History of gallstones     Patient's surgical history, family medical history, social history, medications and allergies were all reviewed in Epic    Current Outpatient Medications  Medication Sig Dispense Refill   albuterol (VENTOLIN HFA) 108 (90 Base) MCG/ACT inhaler INHALE 2 PUFFS BY MOUTH EVERY 6 HOURS AS NEEDED FOR WHEEZING OR SHORTNESS OF BREATH 18 g 3   cyanocobalamin (VITAMIN B12) 1000 MCG/ML injection INJECT 1 ML INTRAMUSCULARLY ONCE EVERY MONTH 1 mL 0   EPINEPHrine 0.3 mg/0.3 mL IJ SOAJ injection      fish oil-omega-3 fatty acids 1000 MG capsule Take 2 g by mouth daily.  fluticasone (FLONASE) 50 MCG/ACT nasal spray Place 4 sprays into the nose daily. 16 g 11   fluticasone (FLOVENT HFA) 110 MCG/ACT inhaler Inhale 1 puff into the lungs 2 times  daily as needed. 12 g 5   pantoprazole (PROTONIX) 40 MG tablet Take 1 tablet (40 mg total) by mouth daily. 90 tablet 1   pramipexole (MIRAPEX) 1 MG tablet TAKE 1 TABLET BY MOUTH AT BEDTIME 90 tablet 1   Spacer/Aero Chamber Mouthpiece MISC 1 Act by Does not apply route 2 (two) times daily. 1 each 11   SPRAVATO, 84 MG DOSE, 28 MG/DEVICE SOPK Place 1 spray into both nostrils once a week.     SYRINGE-NEEDLE, DISP, 3 ML (B-D SYRINGE/NEEDLE 3CC/23GX1") 23G X 1" 3 ML MISC Use to inject B12 injection monthly. 50 each 0   Ubrogepant (UBRELVY) 100 MG TABS Take 1 tablet by mouth daily as needed. 30 tablet 1   VIIBRYD 10 MG TABS Take 10 mg by mouth.     No current facility-administered medications for this visit.    Physical Exam:     Ht 5\' 11"  (1.803 m)   Wt 203 lb 4 oz (92.2 kg)   BMI 28.35 kg/m   GENERAL:  Pleasant male in NAD PSYCH: : Cooperative, normal affect Musculoskeletal:  Normal muscle tone, normal strength NEURO: Alert and oriented x 3, no focal neurologic deficits   IMPRESSION and PLAN:    1) GERD with erosive esophagitis 2) Peptic stricture 3) Regurgitation 4) Heartburn Beau-Jacques C Okray is a 52 y.o. male with a long-standing history of GERD and erosive esophagitis, responsive to PPI therapy but requesting antireflux surgery with goal of improved/resolved symptoms, resolution of esophagitis, and stopping acid suppression medications. Discussed the pathophysiology of GERD at length, to include the risks of untreated reflux (ie, strictures, Barrett's Esophagus, EAC, etc) as well as the possible treatment with medications vs antireflux surgery. In particular, we discussed the risks, benefits, and alternatives of Transoral Incisionless Fundoplication (TIF), to include Nissen fundoplication, and the patient wishes to proceed with TIF.  - Given his history of recurrent peptic stricture, I recommend repeat endoscopy about 2 weeks prior to TIF with balloon dilation of known stricture  to allow for passage of the larger caliber Esophyx device - Continue Protonix as prescribed - Continue antireflux lifestyle/diet modifications - Esophagram ordered for preoperative assessment.  Discussed that we may find a degree of dysmotility that could be related to reflux burnout.  Depending on esophagram findings, discussed the possible need for esophageal manometry. - Otherwise, has clear objective evidence of reflux and no need for pH/impedance testing - Will schedule for TIF at [facility] with plan for subsequent admission to the Hospitalist Service overnight for post-operative observation.  Tentatively scheduled for December - NPO at [facility] prior to the procedure - Reviewed postoperative dietary and activity restrictions with patient and provided handout   I spent 40 minutes of time, including in depth chart review, independent review of results as outlined above, communicating results with the patient directly, face-to-face time with the patient, coordinating care, ordering studies and medications as appropriate, and documentation.           Verlin Dike Jakobee Brackins ,DO, FACG 02/23/2023, 1:25 PM

## 2023-02-24 NOTE — Telephone Encounter (Signed)
I have spoken to patient regarding scheduling TIF procedure. Current first availabiilty to do this would be on 05/25/23. Unfortunately, patient states that he would not be able to have TIF completed at that time as he will be out of the [place] on an extended cruise. Next available date is 06/29/23. I have discussed with patient that we need insurance approval for the procedure and have asked that he come by the office to sign [employer] form (this company performs insurance pre [place]) so we can work on getting that approval completed. Patient says he is unsure he will have the same insurance next year. However, he will come by at his convenience to sign appropriate paperwork for his current insurance to approve/deny in hopes that there  is a cancellation for a sooner TIF procedure. Will continue to follow.

## 2023-02-25 NOTE — Telephone Encounter (Signed)
HIPAA forms signed for [employer] enrollement. Records/referral forms sent to [employer] by fax.

## 2023-03-02 ENCOUNTER — Other Ambulatory Visit: Payer: Self-pay | Admitting: Internal Medicine

## 2023-03-02 DIAGNOSIS — G43009 Migraine without aura, not intractable, without status migrainosus: Secondary | ICD-10-CM

## 2023-03-09 ENCOUNTER — Ambulatory Visit (HOSPITAL_COMMUNITY)
Admission: RE | Admit: 2023-03-09 | Discharge: 2023-03-09 | Disposition: A | Discharge status: Home / Self Care | Payer: Commercial Managed Care - HMO | Source: Ambulatory Visit | Attending: Gastroenterology | Admitting: Gastroenterology

## 2023-03-09 DIAGNOSIS — K2100 Gastro-esophageal reflux disease with esophagitis, without bleeding: Secondary | ICD-10-CM | POA: Insufficient documentation

## 2023-03-16 ENCOUNTER — Encounter: Payer: Self-pay | Admitting: Internal Medicine

## 2023-03-16 ENCOUNTER — Other Ambulatory Visit: Payer: Self-pay | Admitting: Internal Medicine

## 2023-03-16 ENCOUNTER — Ambulatory Visit: Payer: Managed Care, Other (non HMO) | Admitting: Internal Medicine

## 2023-03-16 VITALS — BP 142/76 | HR 81 | Temp 98.4°F | Resp 16 | Ht 71.0 in | Wt 201.0 lb

## 2023-03-16 DIAGNOSIS — E538 Deficiency of other specified B group vitamins: Secondary | ICD-10-CM | POA: Diagnosis not present

## 2023-03-16 DIAGNOSIS — E785 Hyperlipidemia, unspecified: Secondary | ICD-10-CM

## 2023-03-16 DIAGNOSIS — J4530 Mild persistent asthma, uncomplicated: Secondary | ICD-10-CM | POA: Diagnosis not present

## 2023-03-16 DIAGNOSIS — I10 Essential (primary) hypertension: Secondary | ICD-10-CM | POA: Diagnosis not present

## 2023-03-16 DIAGNOSIS — G43009 Migraine without aura, not intractable, without status migrainosus: Secondary | ICD-10-CM

## 2023-03-16 DIAGNOSIS — G63 Polyneuropathy in diseases classified elsewhere: Secondary | ICD-10-CM

## 2023-03-16 DIAGNOSIS — R9431 Abnormal electrocardiogram [ECG] [EKG]: Secondary | ICD-10-CM

## 2023-03-16 DIAGNOSIS — K2100 Gastro-esophageal reflux disease with esophagitis, without bleeding: Secondary | ICD-10-CM

## 2023-03-16 MED ORDER — CYANOCOBALAMIN 1000 MCG/ML IJ SOLN
1000.0000 ug | INTRAMUSCULAR | 3 refills | Status: DC
Start: 2023-03-16 — End: 2023-07-04

## 2023-03-16 MED ORDER — AIRSUPRA 90-80 MCG/ACT IN AERO
2.0000 | INHALATION_SPRAY | Freq: Four times a day (QID) | RESPIRATORY_TRACT | 1 refills | Status: DC | PRN
Start: 2023-03-16 — End: 2023-03-31

## 2023-03-16 MED ORDER — FLUTICASONE PROPIONATE HFA 110 MCG/ACT IN AERO
1.0000 | INHALATION_SPRAY | Freq: Two times a day (BID) | RESPIRATORY_TRACT | 5 refills | Status: DC | PRN
Start: 2023-03-16 — End: 2023-03-16

## 2023-03-16 MED ORDER — UBRELVY 100 MG PO TABS: 1.0000 | ORAL_TABLET | Freq: Every day | ORAL | 2 refills | Status: DC | PRN

## 2023-03-16 MED ORDER — "BD SYRINGE/NEEDLE 23G X 1"" 3 ML MISC": 0 refills | Status: AC

## 2023-03-16 MED ORDER — QVAR REDIHALER 40 MCG/ACT IN AERB: 1.0000 | INHALATION_SPRAY | Freq: Two times a day (BID) | RESPIRATORY_TRACT | 1 refills | Status: DC

## 2023-03-16 NOTE — Patient Instructions (Signed)
Hypertension, Adult High blood pressure (hypertension) is when the force of blood pumping through the arteries is too strong. The arteries are the blood vessels that carry blood from the heart throughout the body. Hypertension forces the heart to work harder to pump blood and may cause arteries to become narrow or stiff. Untreated or uncontrolled hypertension can lead to a heart attack, heart failure, a stroke, kidney disease, and other problems. A blood pressure reading consists of a higher number over a lower number. Ideally, your blood pressure should be below 120/80. The first ("top") number is called the systolic pressure. It is a measure of the pressure in your arteries as your heart beats. The second ("bottom") number is called the diastolic pressure. It is a measure of the pressure in your arteries as the heart relaxes. What are the causes? The exact cause of this condition is not known. There are some conditions that result in high blood pressure. What increases the risk? Certain factors may make you more likely to develop high blood pressure. Some of these risk factors are under your control, including: Smoking. Not getting enough exercise or physical activity. Being overweight. Having too much fat, sugar, calories, or salt (sodium) in your diet. Drinking too much alcohol. Other risk factors include: Having a personal history of heart disease, diabetes, high cholesterol, or kidney disease. Stress. Having a family history of high blood pressure and high cholesterol. Having obstructive sleep apnea. Age. The risk increases with age. What are the signs or symptoms? High blood pressure may not cause symptoms. Very high blood pressure (hypertensive crisis) may cause: Headache. Fast or irregular heartbeats (palpitations). Shortness of breath. Nosebleed. Nausea and vomiting. Vision changes. Severe chest pain, dizziness, and seizures. How is this diagnosed? This condition is diagnosed by  measuring your blood pressure while you are seated, with your arm resting on a flat surface, your legs uncrossed, and your feet flat on the floor. The cuff of the blood pressure monitor will be placed directly against the skin of your upper arm at the level of your heart. Blood pressure should be measured at least twice using the same arm. Certain conditions can cause a difference in blood pressure between your right and left arms. If you have a high blood pressure reading during one visit or you have normal blood pressure with other risk factors, you may be asked to: Return on a different day to have your blood pressure checked again. Monitor your blood pressure at home for 1 week or longer. If you are diagnosed with hypertension, you may have other blood or imaging tests to help your health care provider understand your overall risk for other conditions. How is this treated? This condition is treated by making healthy lifestyle changes, such as eating healthy foods, exercising more, and reducing your alcohol intake. You may be referred for counseling on a healthy diet and physical activity. Your health care provider may prescribe medicine if lifestyle changes are not enough to get your blood pressure under control and if: Your systolic blood pressure is above 130. Your diastolic blood pressure is above 80. Your personal target blood pressure may vary depending on your medical conditions, your age, and other factors. Follow these instructions at home: Eating and drinking  Eat a diet that is high in fiber and potassium, and low in sodium, added sugar, and fat. An example of this eating plan is called the DASH diet. DASH stands for Dietary Approaches to Stop Hypertension. To eat this way: Eat   plenty of fresh fruits and vegetables. Try to fill one half of your plate at each meal with fruits and vegetables. Eat whole grains, such as whole-wheat pasta, brown rice, or whole-grain bread. Fill about one  fourth of your plate with whole grains. Eat or drink low-fat dairy products, such as skim milk or low-fat yogurt. Avoid fatty cuts of meat, processed or cured meats, and poultry with skin. Fill about one fourth of your plate with lean proteins, such as fish, chicken without skin, beans, eggs, or tofu. Avoid pre-made and processed foods. These tend to be higher in sodium, added sugar, and fat. Reduce your daily sodium intake. Many people with hypertension should eat less than 1,500 mg of sodium a day. Do not drink alcohol if: Your health care provider tells you not to drink. You are pregnant, may be pregnant, or are planning to become pregnant. If you drink alcohol: Limit how much you have to: 0-1 drink a day for women. 0-2 drinks a day for men. Know how much alcohol is in your drink. In the [place] one drink equals one 12 oz bottle of beer (355 mL), one 5 oz glass of wine (148 mL), or one 1 oz glass of hard liquor (44 mL). Lifestyle  Work with your health care provider to maintain a healthy body weight or to lose weight. Ask what an ideal weight is for you. Get at least 30 minutes of exercise that causes your heart to beat faster (aerobic exercise) most days of the week. Activities may include walking, swimming, or biking. Include exercise to strengthen your muscles (resistance exercise), such as Pilates or lifting weights, as part of your weekly exercise routine. Try to do these types of exercises for 30 minutes at least 3 days a week. Do not use any products that contain nicotine or tobacco. These products include cigarettes, chewing tobacco, and vaping devices, such as e-cigarettes. If you need help quitting, ask your health care provider. Monitor your blood pressure at home as told by your health care provider. Keep all follow-up visits. This is important. Medicines Take over-the-counter and prescription medicines only as told by your health care provider. Follow directions carefully. Blood  pressure medicines must be taken as prescribed. Do not skip doses of blood pressure medicine. Doing this puts you at risk for problems and can make the medicine less effective. Ask your health care provider about side effects or reactions to medicines that you should watch for. Contact a health care provider if you: Think you are having a reaction to a medicine you are taking. Have headaches that keep coming back (recurring). Feel dizzy. Have swelling in your ankles. Have trouble with your vision. Get help right away if you: Develop a severe headache or confusion. Have unusual weakness or numbness. Feel faint. Have severe pain in your chest or abdomen. Vomit repeatedly. Have trouble breathing. These symptoms may be an emergency. Get help right away. Call 911. Do not wait to see if the symptoms will go away. Do not drive yourself to the hospital. Summary Hypertension is when the force of blood pumping through your arteries is too strong. If this condition is not controlled, it may put you at risk for serious complications. Your personal target blood pressure may vary depending on your medical conditions, your age, and other factors. For most people, a normal blood pressure is less than 120/80. Hypertension is treated with lifestyle changes, medicines, or a combination of both. Lifestyle changes include losing weight, eating a healthy,   low-sodium diet, exercising more, and limiting alcohol. This information is not intended to replace advice given to you by your health care provider. Make sure you discuss any questions you have with your health care provider. Document Revised: 04/08/2021 Document Reviewed: 04/08/2021 Elsevier Patient Education  2024 [employer]  

## 2023-03-16 NOTE — Progress Notes (Unsigned)
Subjective:  Patient ID: Derek Tucker, male    DOB: 10-Oct-1970  Age: 52 y.o. MRN: [id number]  CC: Hypertension, Hyperlipidemia, and Asthma   HPI Derek Tucker presents for f/up ---   Discussed the use of AI scribe software for clinical note transcription with the patient, who gave verbal consent to proceed.  History of Present Illness   The patient, with a history of asthma and gastroesophageal reflux disease (GERD), presents for a routine follow-up and prescription refills. They report a recent syncopal episode on an airplane, which is not an uncommon occurrence for them. These episodes are characterized by dizziness, diaphoresis, and brief loss of consciousness, followed by a period of feeling unwell. The patient has had multiple such episodes throughout their life, with triggers including pain, fear, and possibly vasovagal responses. They deny any associated chest pain or shortness of breath.  The patient also reports fatigue, which they attribute to a lack of recent B12 injections due to an expired prescription. They self-administer these injections and have not experienced any numbness, weakness, or tingling.  Additionally, the patient mentions occasional abdominal pain, which they experienced during the recent syncopal episode. They attribute this to constipation during travel and the subsequent return of bowel motility. They also note that treatment with Protonix for GERD has significantly reduced the frequency of their nocturnal syncopal episodes, suggesting a possible link between these episodes and acid reflux.  The patient's asthma is described as "decently" controlled with fluticasone inhaler and Flonase for allergies. They also take Ubrelvy for headaches, which they report as effective. They have recently received a flu shot and COVID booster at a local pharmacy.  The patient's blood pressure was noted to be elevated during the visit, and they acknowledge that their  blood pressure can fluctuate. They deny any current treatment for hypertension. They also express a need to lose weight.       Outpatient Medications Prior to Visit  Medication Sig Dispense Refill   EPINEPHrine 0.3 mg/0.3 mL IJ SOAJ injection      fish oil-omega-3 fatty acids 1000 MG capsule Take 2 g by mouth daily.     fluticasone (FLONASE) 50 MCG/ACT nasal spray Place 4 sprays into the nose daily. 16 g 11   pantoprazole (PROTONIX) 40 MG tablet Take 1 tablet (40 mg total) by mouth daily. 90 tablet 1   pramipexole (MIRAPEX) 1 MG tablet TAKE 1 TABLET BY MOUTH AT BEDTIME 90 tablet 1   Spacer/Aero Chamber Mouthpiece MISC 1 Act by Does not apply route 2 (two) times daily. 1 each 11   SPRAVATO, 84 MG DOSE, 28 MG/DEVICE SOPK Place 1 spray into both nostrils once a week.     VIIBRYD 10 MG TABS Take 10 mg by mouth.     albuterol (VENTOLIN HFA) 108 (90 Base) MCG/ACT inhaler INHALE 2 PUFFS BY MOUTH EVERY 6 HOURS AS NEEDED FOR WHEEZING OR SHORTNESS OF BREATH 18 g 3   cyanocobalamin (VITAMIN B12) 1000 MCG/ML injection INJECT 1 ML INTRAMUSCULARLY ONCE EVERY MONTH 1 mL 0   fluticasone (FLOVENT HFA) 110 MCG/ACT inhaler Inhale 1 puff into the lungs 2 times daily as needed. 12 g 5   SYRINGE-NEEDLE, DISP, 3 ML (B-D SYRINGE/NEEDLE 3CC/23GX1") 23G X 1" 3 ML MISC Use to inject B12 injection monthly. 50 each 0   UBRELVY 100 MG TABS TAKE 1 TABLET BY MOUTH ONCE DAILY AS NEEDED 16 tablet 0   No facility-administered medications prior to visit.    ROS Review of Systems  Constitutional:  Positive for fatigue. Negative for appetite change, chills and unexpected weight change.  HENT: Negative.    Respiratory:  Negative for cough, chest tightness, shortness of breath and wheezing.   Cardiovascular:  Negative for chest pain, palpitations and leg swelling.  Gastrointestinal:  Positive for abdominal pain and constipation. Negative for blood in stool, diarrhea, nausea and vomiting.  Genitourinary: Negative.  Negative for  difficulty urinating.  Musculoskeletal: Negative.  Negative for arthralgias and myalgias.  Skin: Negative.  Negative for color change and pallor.  Neurological:  Positive for syncope and headaches. Negative for dizziness, weakness, light-headedness and numbness.  Hematological:  Negative for adenopathy. Does not bruise/bleed easily.  Psychiatric/Behavioral:  Positive for dysphoric mood. Negative for confusion, decreased concentration, sleep disturbance and suicidal ideas. The patient is nervous/anxious.     Objective:  BP (!) 142/76 (BP Location: Left Arm, Patient Position: Sitting, Cuff Size: Large)   Pulse 81   Temp 98.4 F (36.9 C) (Oral)   Resp 16   Ht 5\' 11"  (1.803 m)   Wt 201 lb (91.2 kg)   SpO2 98%   BMI 28.03 kg/m   BP Readings from Last 3 Encounters:  03/16/23 (!) 142/76  02/23/23 110/76  02/18/23 116/72    Wt Readings from Last 3 Encounters:  03/16/23 201 lb (91.2 kg)  02/23/23 203 lb 4 oz (92.2 kg)  02/18/23 200 lb (90.7 kg)    Physical Exam Vitals reviewed.  Constitutional:      General: He is not in acute distress.    Appearance: Normal appearance. He is not ill-appearing, toxic-appearing or diaphoretic.  HENT:     Nose: Nose normal.     Mouth/Throat:     Mouth: Mucous membranes are moist.  Eyes:     General: No scleral icterus.    Conjunctiva/sclera: Conjunctivae normal.  Cardiovascular:     Rate and Rhythm: Regular rhythm. Tachycardia present.     Heart sounds: Normal heart sounds, S1 normal and S2 normal. No murmur heard.    No friction rub. No gallop.     Comments: EKG- NSR, 100 bpm NS ST segment abnormality  No LVH or Q waves Pulmonary:     Effort: Pulmonary effort is normal.     Breath sounds: No stridor. No wheezing, rhonchi or rales.  Abdominal:     General: Abdomen is flat.     Palpations: There is no mass.     Tenderness: There is no abdominal tenderness. There is no guarding.     Hernia: No hernia is present.  Musculoskeletal:         General: Normal range of motion.     Cervical back: Neck supple.     Right lower leg: No edema.     Left lower leg: No edema.  Skin:    General: Skin is warm and dry.     Findings: No rash.  Neurological:     General: No focal deficit present.     Mental Status: He is alert. Mental status is at baseline.  Psychiatric:        Attention and Perception: He is inattentive.        Mood and Affect: Mood is anxious.        Speech: Speech normal.        Behavior: Behavior normal. Behavior is not agitated, slowed, aggressive or hyperactive.        Thought Content: Thought content normal. Thought content is not paranoid or delusional. Thought content does not include  homicidal or suicidal ideation.        Cognition and Memory: Cognition normal.        Judgment: Judgment normal.     Lab Results  Component Value Date   WBC 7.3 06/05/2022   HGB 15.8 06/05/2022   HCT 46.2 06/05/2022   PLT 314.0 06/05/2022   GLUCOSE 115 (H) 04/23/2022   CHOL 127 02/14/2021   TRIG 209.0 (H) 02/14/2021   HDL 38.60 (L) 02/14/2021   LDLDIRECT 71.0 02/14/2021   LDLCALC 64 11/29/2019   ALT 30 04/23/2022   AST 25 04/23/2022   NA 137 04/23/2022   K 4.1 04/23/2022   CL 101 04/23/2022   CREATININE 0.99 04/23/2022   BUN 6 04/23/2022   CO2 28 04/23/2022   TSH 2.46 02/14/2021   PSA 0.24 04/23/2022    DG ESOPHAGUS W SINGLE CM (SOL OR THIN BA)  Result Date: 03/09/2023 CLINICAL DATA:  History of gastroesophageal reflux, esophageal stricture and esophagitis. Evaluation for possible endoscopic TIF procedure. EXAM: ESOPHOGRAM / BARIUM SWALLOW / BARIUM TABLET STUDY TECHNIQUE: A single contrast fluoroscopic exam was performed with thin barium liquid. The patient was observed with fluoroscopy swallowing a 13 mm barium sulphate tablet. FLUOROSCOPY: Radiation Exposure Index (as provided by the fluoroscopic device): 15 mGy Kerma COMPARISON:  None Available. FINDINGS: Swallowing mechanism is within normal limits. The  esophagus demonstrates normal motility with no visualized tertiary contractions. No mucosal abnormalities or lesions identified. In the upright position there was suggestion of very mild smoothly tapered narrowing of the distal esophagus. This was less pronounced in the prone drinking position with only very minimal suggestion of a focal stricture just above the GE junction that did not appear significant. No evidence of hiatal hernia or active gastroesophageal reflux during the study. The patient swallowed a 13 mm barium tablet without difficulty demonstrating normal rapid transit through the esophagus and into the stomach without restriction at the level of the distal esophagus. IMPRESSION: Very mild narrowing of the distal esophagus in the upright position which was less pronounced in the prone drinking position. This did not appear significant and a 13 mm barium tablet passed through this area without difficulty. Electronically Signed   By: Irish Lack M.D.   On: 03/09/2023 12:06    Assessment & Plan:  Mild persistent asthma without complication -     CBC with Differential/Platelet; Future -     Airsupra; Inhale 2 puffs into the lungs 4 (four) times daily as needed.  Dispense: 32.1 g; Refill: 1 -     Qvar RediHaler; Inhale 1 puff into the lungs 2 (two) times daily.  Dispense: 10.6 each; Refill: 1  Gastroesophageal reflux disease with esophagitis without hemorrhage -     CBC with Differential/Platelet; Future  Essential hypertension -     CBC with Differential/Platelet; Future -     TSH; Future -     Urinalysis, Routine w reflex microscopic; Future -     Basic metabolic panel; Future -     Hepatic function panel; Future -     EKG 12-Lead  Vitamin B12 deficiency neuropathy (HCC) -     CBC with Differential/Platelet; Future -     Vitamin B12; Future -     Folate; Future -     Cyanocobalamin; Inject 1 mL (1,000 mcg total) into the muscle every 30 (thirty) days.  Dispense: 1 mL; Refill:  3 -     BD Syringe/Needle; Use to inject B12 injection monthly.  Dispense: 50 each; Refill:  0  Dyslipidemia, goal LDL below 130 -     Lipid panel; Future -     Hepatic function panel; Future  Migraine without aura and without status migrainosus, not intractable Bernita Raisin; Take 1 tablet (100 mg total) by mouth daily as needed.  Dispense: 16 tablet; Refill: 2  Abnormal EKG -     Troponin I (High Sensitivity); Future -     Brain natriuretic peptide; Future     Follow-up: Return in about 4 months (around 07/17/2023).  Sanda Linger, MD

## 2023-03-19 ENCOUNTER — Other Ambulatory Visit (INDEPENDENT_AMBULATORY_CARE_PROVIDER_SITE_OTHER): Payer: Managed Care, Other (non HMO)

## 2023-03-19 DIAGNOSIS — E785 Hyperlipidemia, unspecified: Secondary | ICD-10-CM

## 2023-03-19 DIAGNOSIS — R9431 Abnormal electrocardiogram [ECG] [EKG]: Secondary | ICD-10-CM | POA: Diagnosis not present

## 2023-03-19 DIAGNOSIS — G63 Polyneuropathy in diseases classified elsewhere: Secondary | ICD-10-CM

## 2023-03-19 DIAGNOSIS — J4530 Mild persistent asthma, uncomplicated: Secondary | ICD-10-CM | POA: Diagnosis not present

## 2023-03-19 DIAGNOSIS — E538 Deficiency of other specified B group vitamins: Secondary | ICD-10-CM

## 2023-03-19 DIAGNOSIS — K2100 Gastro-esophageal reflux disease with esophagitis, without bleeding: Secondary | ICD-10-CM | POA: Diagnosis not present

## 2023-03-19 DIAGNOSIS — I10 Essential (primary) hypertension: Secondary | ICD-10-CM

## 2023-03-19 LAB — BASIC METABOLIC PANEL
BUN: 8 mg/dL (ref 6–23)
CO2: 26 meq/L (ref 19–32)
Calcium: 9.3 mg/dL (ref 8.4–10.5)
Chloride: 102 meq/L (ref 96–112)
Creatinine, Ser: 0.98 mg/dL (ref 0.40–1.50)
GFR: 87.37 mL/min (ref >60.00)
Glucose, Bld: 106 mg/dL — ABNORMAL HIGH (ref 70–99)
Potassium: 3.9 meq/L (ref 3.5–5.1)
Sodium: 137 meq/L (ref 135–145)

## 2023-03-19 LAB — CBC WITH DIFFERENTIAL/PLATELET
Basophils Absolute: 0.1 10*3/uL (ref 0.0–0.1)
Basophils Relative: 1.4 % (ref 0.0–3.0)
Eosinophils Absolute: 0.3 10*3/uL (ref 0.0–0.7)
Eosinophils Relative: 5 % (ref 0.0–5.0)
HCT: 44.3 % (ref 39.0–52.0)
Hemoglobin: 14.8 g/dL (ref 13.0–17.0)
Lymphocytes Relative: 29.5 % (ref 12.0–46.0)
Lymphs Abs: 1.9 10*3/uL (ref 0.7–4.0)
MCHC: 33.3 g/dL (ref 30.0–36.0)
MCV: 88.3 fL (ref 78.0–100.0)
Monocytes Absolute: 0.7 10*3/uL (ref 0.1–1.0)
Monocytes Relative: 10.1 % (ref 3.0–12.0)
Neutro Abs: 3.5 10*3/uL (ref 1.4–7.7)
Neutrophils Relative %: 54 % (ref 43.0–77.0)
Platelets: 314 10*3/uL (ref 150.0–400.0)
RBC: 5.02 Mil/uL (ref 4.22–5.81)
RDW: 14 % (ref 11.5–15.5)
WBC: 6.6 10*3/uL (ref 4.0–10.5)

## 2023-03-19 LAB — LIPID PANEL
Cholesterol: 121 mg/dL (ref 0–200)
HDL: 39.9 mg/dL (ref >39.00)
LDL Cholesterol: 54 mg/dL (ref 0–99)
NonHDL: 81.35
Total CHOL/HDL Ratio: 3
Triglycerides: 136 mg/dL (ref 0.0–149.0)
VLDL: 27.2 mg/dL (ref 0.0–40.0)

## 2023-03-19 LAB — URINALYSIS, ROUTINE W REFLEX MICROSCOPIC
Bilirubin Urine: NEGATIVE
Ketones, ur: NEGATIVE
Leukocytes,Ua: NEGATIVE
Nitrite: NEGATIVE
Specific Gravity, Urine: 1.015 (ref 1.000–1.030)
Total Protein, Urine: NEGATIVE
Urine Glucose: NEGATIVE
Urobilinogen, UA: 0.2 (ref 0.0–1.0)
WBC, UA: NONE SEEN (ref 0–?)
pH: 7.5 (ref 5.0–8.0)

## 2023-03-19 LAB — HEPATIC FUNCTION PANEL
ALT: 34 U/L (ref 0–53)
AST: 33 U/L (ref 0–37)
Albumin: 4.5 g/dL (ref 3.5–5.2)
Alkaline Phosphatase: 56 U/L (ref 39–117)
Bilirubin, Direct: 0.2 mg/dL (ref 0.0–0.3)
Total Bilirubin: 1.2 mg/dL (ref 0.2–1.2)
Total Protein: 7.2 g/dL (ref 6.0–8.3)

## 2023-03-19 LAB — TROPONIN I (HIGH SENSITIVITY): High Sens Troponin I: <2 ng/L (ref 2–17)

## 2023-03-19 LAB — VITAMIN B12: Vitamin B-12: 414 pg/mL (ref 211–911)

## 2023-03-19 LAB — FOLATE: Folate: 11 ng/mL (ref >5.9)

## 2023-03-19 LAB — BRAIN NATRIURETIC PEPTIDE: Pro B Natriuretic peptide (BNP): 7 pg/mL (ref 0.0–100.0)

## 2023-03-19 LAB — TSH: TSH: 2.24 u[IU]/mL (ref 0.35–5.50)

## 2023-03-19 NOTE — Telephone Encounter (Signed)
Spoke to patient to advise that we actually got an email this morning indicating that insurance has denied PA for TIF at this time.  Payer: [employer] Number: [id number] Code Denied: [id number] Denial Reason: investigational/experimental Next Steps: A peer-to-peer is available by calling [phone].  If the physician is unable to complete the peer to peer, we will move forward with a formal appeal submission.  Advised patient that procedure is often denied with the first request for PA. However, this is NOT the final determination at this time. We will continue working to get approval.  Dr Barron Alvine- How would you like to proceed?

## 2023-03-19 NOTE — Telephone Encounter (Signed)
PT is calling to get an update on scheduling TIF procedure. Please advise.

## 2023-03-19 NOTE — Telephone Encounter (Signed)
Formal review has been requested with [facility].

## 2023-03-28 ENCOUNTER — Other Ambulatory Visit: Payer: Self-pay | Admitting: Internal Medicine

## 2023-03-28 DIAGNOSIS — G43009 Migraine without aura, not intractable, without status migrainosus: Secondary | ICD-10-CM

## 2023-03-31 ENCOUNTER — Ambulatory Visit (INDEPENDENT_AMBULATORY_CARE_PROVIDER_SITE_OTHER): Payer: Commercial Managed Care - HMO | Admitting: Gastroenterology

## 2023-03-31 ENCOUNTER — Encounter: Payer: Self-pay | Admitting: Gastroenterology

## 2023-03-31 VITALS — BP 118/80 | HR 92 | Ht 70.0 in | Wt 201.2 lb

## 2023-03-31 DIAGNOSIS — K641 Second degree hemorrhoids: Secondary | ICD-10-CM

## 2023-03-31 NOTE — Patient Instructions (Signed)
Please follow up as needed    HEMORRHOID BANDING PROCEDURE    FOLLOW-UP CARE   The procedure you have had should have been relatively painless since the banding of the area involved does not have nerve endings and there is no pain sensation.  The rubber band cuts off the blood supply to the hemorrhoid and the band may fall off as soon as 48 hours after the banding (the band may occasionally be seen in the toilet bowl following a bowel movement). You may notice a temporary feeling of fullness in the rectum which should respond adequately to plain Tylenol or Motrin.  Following the banding, avoid strenuous exercise that evening and resume full activity the next day.  A sitz bath (soaking in a warm tub) or bidet is soothing, and can be useful for cleansing the area after bowel movements.     To avoid constipation, take two tablespoons of natural wheat bran, natural oat bran, flax, Benefiber or any over the counter fiber supplement and increase your water intake to 7-8 glasses daily.    Unless you have been prescribed anorectal medication, do not put anything inside your rectum for two weeks: No suppositories, enemas, fingers, etc.  Occasionally, you may have more bleeding than usual after the banding procedure.  This is often from the untreated hemorrhoids rather than the treated one.  Don't be concerned if there is a tablespoon or so of blood.  If there is more blood than this, lie flat with your bottom higher than your head and apply an ice pack to the area. If the bleeding does not stop within a half an hour or if you feel faint, call our office at [phone] or go to the emergency room.  Problems are not common; however, if there is a substantial amount of bleeding, severe pain, chills, fever or difficulty passing urine (very rare) or other problems, you should call us at [phone] or report to the nearest emergency room.  Do not stay seated continuously for more than 2-3 hours for  a day or two after the procedure.  Tighten your buttock muscles 10-15 times every two hours and take 10-15 deep breaths every 1-2 hours.  Do not spend more than a few minutes on the toilet if you cannot empty your bowel; instead re-visit the toilet at a later time.   _______________________________________________________  If your blood pressure at your visit was 140/90 or greater, please contact your primary care physician to follow up on this.  _______________________________________________________  If you are age 35 or older, your body mass index should be between 23-30. Your Body mass index is 28.88 kg/m. If this is out of the aforementioned range listed, please consider follow up with your Primary Care Provider.  If you are age 74 or younger, your body mass index should be between 19-25. Your Body mass index is 28.88 kg/m. If this is out of the aformentioned range listed, please consider follow up with your Primary Care Provider.   ________________________________________________________  The [place] [facility] providers would like to encourage you to use [facility] to communicate with providers for non-urgent requests or questions.  Due to long hold times on the telephone, sending your provider a message by [facility] may be a faster and more efficient way to get a response.  Please allow 48 business hours for a response.  Please remember that this is for non-urgent requests.  _______________________________________________________ Due to recent changes in healthcare laws, you may see the results of  your imaging and laboratory studies on MyChart before your provider has had a chance to review them.  We understand that in some cases there may be results that are confusing or concerning to you. Not all laboratory results come back in the same time frame and the provider may be waiting for multiple results in order to interpret others.  Please give [place] 48 hours in order for your provider to thoroughly review  all the results before contacting the office for clarification of your results.

## 2023-03-31 NOTE — Progress Notes (Signed)
52 year old male here for follow-up visit for hemorrhoids / hemorrhoid banding.   He had a colonoscopy in March 2021 with Dr. Jeani Hawking.  1 small polyp removed, told to repeat in 7 years.  Diverticulosis noted, no other concerning pathology.  Patient had been experiencing symptomatic hemorrhoids for years.  He has occasional flares of itching and discomfort. He has grade 2 prolapse, occasional bleeding on the toilet paper, burning with itching.    We discussed options in the past, he wanted to proceed with hemorrhoid banding. RP banded on 10/14/22, LL on 11/12/22.   He tolerated the banding's well without pain or bleeding.  He states after the last banding on May 30, the day after he had food poisoning and had severe diarrhea within 24 hours of the banding and wonders if the band came off.  He states overall his symptoms are about 50% improved after the first 2 bands.  He wishes to have additional banding today after discussion of this issue     PROCEDURE NOTE: The patient presents with symptomatic grade II  hemorrhoids, requesting rubber band ligation of his/her hemorrhoidal disease.  All risks, benefits and alternative forms of therapy were described and informed consent was obtained.     The anorectum was pre-medicated with 0.125% nitroglycerin The decision was made to band the junction of the RA and LL internal hemorrhoid, and the [facility] O'Regan System was used to perform band ligation without complication.  The band was not applying well to the RA site itself and after a few trials of that area I placed it at the junction of the RA and left lateral region. Digital anorectal examination was then performed to assure proper positioning of the band, and to adjust the banded tissue as required.  The patient was discharged home without pain or other issues.  Dietary and behavioral recommendations were given and along with follow-up instructions.     The following adjunctive treatments were  recommended: Continue daily fiber supplement   Follow up as needed for persistent symptoms, no further bandings planned at this time otherwise if his symptoms improve.  Harlin Rain, MD [facility]

## 2023-04-13 NOTE — Telephone Encounter (Signed)
Received note from [facility] with the following information:  Derek Tucker, Jun 29, 1970 Case ID: [id number]  Good Afternoon Dottie,  We have made multiple efforts to contact the patient in order to obtain the Authorized Representative form necessary for initiating the appeal process. Regrettably, we have not yet received a response. Please encourage the patient to submit the signed form to our program if they wish to proceed with the appeal. Currently, the case is on hold.  Regards, April [facility] Case Manager TIF Patient Access Program [address], [address] [place], [place] [address] Phone: [phone] Fax: [phone]  Email: [email or web address]  [employer] Confidentiality Notice:

## 2023-04-21 NOTE — Telephone Encounter (Signed)
Inbound call from patient, requesting follow up on TIF procedure.

## 2023-04-23 ENCOUNTER — Other Ambulatory Visit: Payer: Self-pay | Admitting: Internal Medicine

## 2023-04-23 DIAGNOSIS — J4530 Mild persistent asthma, uncomplicated: Secondary | ICD-10-CM

## 2023-05-16 ENCOUNTER — Other Ambulatory Visit: Payer: Self-pay | Admitting: Internal Medicine

## 2023-05-16 DIAGNOSIS — J4530 Mild persistent asthma, uncomplicated: Secondary | ICD-10-CM

## 2023-06-23 ENCOUNTER — Other Ambulatory Visit (HOSPITAL_COMMUNITY): Payer: Self-pay

## 2023-06-23 ENCOUNTER — Telehealth: Payer: Self-pay

## 2023-06-23 NOTE — Telephone Encounter (Signed)
 Pharmacy Patient Advocate Encounter   Received notification from CoverMyMeds that prior authorization for Ubrelvy  100MG  tablets is required/requested.   Insurance verification completed.   The patient is insured through [employer] .   Per test claim: PA required; PA submitted to above mentioned insurance via CoverMyMeds Key/confirmation #/EOC [id number] Status is pending

## 2023-06-24 ENCOUNTER — Other Ambulatory Visit (HOSPITAL_COMMUNITY): Payer: Self-pay

## 2023-06-24 NOTE — Telephone Encounter (Signed)
 Pharmacy Patient Advocate Encounter  Received notification from [employer] that Prior Authorization for Ubrelvy  100 mg has been APPROVED from 06/23/23 to 06/22/24. Ran test claim, Copay is $0.00. This test claim was processed through [facility]- copay amounts may vary at other pharmacies due to pharmacy/plan contracts, or as the patient moves through the different stages of their insurance plan.   PA #/Case ID/Reference #: [id number]

## 2023-07-04 ENCOUNTER — Other Ambulatory Visit: Payer: Self-pay | Admitting: Internal Medicine

## 2023-07-04 DIAGNOSIS — E538 Deficiency of other specified B group vitamins: Secondary | ICD-10-CM

## 2023-07-13 ENCOUNTER — Ambulatory Visit: Payer: Self-pay | Admitting: Internal Medicine

## 2023-07-13 ENCOUNTER — Telehealth: Payer: Commercial Managed Care - HMO | Admitting: Internal Medicine

## 2023-07-13 ENCOUNTER — Telehealth: Payer: Self-pay | Admitting: Internal Medicine

## 2023-07-13 DIAGNOSIS — K2100 Gastro-esophageal reflux disease with esophagitis, without bleeding: Secondary | ICD-10-CM

## 2023-07-13 DIAGNOSIS — K529 Noninfective gastroenteritis and colitis, unspecified: Secondary | ICD-10-CM | POA: Insufficient documentation

## 2023-07-13 DIAGNOSIS — J4530 Mild persistent asthma, uncomplicated: Secondary | ICD-10-CM

## 2023-07-13 MED ORDER — DIPHENOXYLATE-ATROPINE 2.5-0.025 MG PO TABS: 1.0000 | ORAL_TABLET | Freq: Four times a day (QID) | ORAL | 1 refills | Status: DC | PRN

## 2023-07-13 MED ORDER — ONDANSETRON 4 MG PO TBDP: 4.0000 mg | ORAL_TABLET | Freq: Three times a day (TID) | ORAL | 1 refills | Status: DC | PRN

## 2023-07-13 NOTE — Telephone Encounter (Signed)
Copied from CRM [id number]. Topic: General - Other >> Jul 13, 2023 11:58 AM Dimitri Ped wrote: Reason for CRM: patient is calling cause he was in the waiting room and it kicked me out after a hr . And he received a message from doctor that dr was running late and that patient was not there for appointment but patient wants dr to know that it kicked him out after waiting for a 1 hr. Call back number [phone]

## 2023-07-13 NOTE — Telephone Encounter (Signed)
What's the ICD 10 code associated with this medication ?

## 2023-07-13 NOTE — Telephone Encounter (Signed)
Copied from CRM [id number]. Topic: Clinical - Red Word Triage >> Jul 13, 2023  9:05 AM Leavy Cella D wrote: Red Word that prompted transfer to Nurse Triage: Patient is in [place] has been experiencing uncontrollable vomiting and diarrhea   Chief Complaint: Abdominal pain with N/V/D x 3 days Symptoms: watery stools Frequency: constant Pertinent Negatives: Patient denies fever Disposition: [] ED /[] Urgent Care (no appt availability in office) / [x] Appointment(In office/virtual)/ []  [facility]/ [] Home Care/ [] Refused Recommended Disposition /[] [facility]/ []  Follow-up with PCP Additional Notes: Pt currently out of town in [place], started having abd pain with watery diarrhea and vomiting. Possible exposure to GI bug from coworker.  Requesting virtual visit vs going to an urgent care out of town. Appt schedule with Dr. Jonny Ruiz at 11am Reason for Disposition  [1] MODERATE diarrhea (e.g., 4-6 times / day more than normal) AND [2] present > 48 hours (2 days)  Answer Assessment - Initial Assessment Questions 1. DIARRHEA SEVERITY: "How bad is the diarrhea?" "How many more stools have you had in the past 24 hours than normal?"    - NO DIARRHEA (SCALE 0)   - MILD (SCALE 1-3): Few loose or mushy BMs; increase of 1-3 stools over normal daily number of stools; mild increase in ostomy output.   -  MODERATE (SCALE 4-7): Increase of 4-6 stools daily over normal; moderate increase in ostomy output.   -  SEVERE (SCALE 8-10; OR "WORST POSSIBLE"): Increase of 7 or more stools daily over normal; moderate increase in ostomy output; incontinence.     6 times  2. ONSET: "When did the diarrhea begin?"      Started 3 days ago  3. BM CONSISTENCY: "How loose or watery is the diarrhea?"      Watery episode  4. VOMITING: "Are you also vomiting?" If Yes, ask: "How many times in the past 24 hours?"      2 times within 24 episode  5. ABDOMEN PAIN: "Are you having any abdomen pain?" If Yes, ask: "What  does it feel like?" (e.g., crampy, dull, intermittent, constant)      Yes, dull achy  6. ABDOMEN PAIN SEVERITY: If present, ask: "How bad is the pain?"  (e.g., Scale 1-10; mild, moderate, or severe)   - MILD (1-3): doesn't interfere with normal activities, abdomen soft and not tender to touch    - MODERATE (4-7): interferes with normal activities or awakens from sleep, abdomen tender to touch    - SEVERE (8-10): excruciating pain, doubled over, unable to do any normal activities       4/10 pain  7. ORAL INTAKE: If vomiting, "Have you been able to drink liquids?" "How much liquids have you had in the past 24 hours?"     Has been able to drink Gatorade  8. HYDRATION: "Any signs of dehydration?" (e.g., dry mouth [not just dry lips], too weak to stand, dizziness, new weight loss) "When did you last urinate?"     Mouth is dry, still urinating  9. EXPOSURE: "Have you traveled to a foreign country recently?" "Have you been exposed to anyone with diarrhea?" "Could you have eaten any food that was spoiled?"     Unsure of exposure, but some co-worker have been sick  10. ANTIBIOTIC USE: "Are you taking antibiotics now or have you taken antibiotics in the past 2 months?"       No antibiotics  11. OTHER SYMPTOMS: "Do you have any other symptoms?" (e.g., fever, blood in stool)  No  Protocols used: Diarrhea-A-AH

## 2023-07-13 NOTE — Telephone Encounter (Signed)
No problem , I was later able to contact the pt, thanks

## 2023-07-13 NOTE — Telephone Encounter (Signed)
Copied from CRM [id number]. Topic: Clinical - Prescription Issue >> Jul 13, 2023  5:11 PM Clayton Bibles wrote: Reason for CRM: They are at pharmacy in [facility]. There is no DX code on prescription diphenoxylate-atropine (LOMOTIL) 2.5-0.025 MG   Patient's partner is at the pharmacy to pick up the Lomotil prescribed today, stating the pharmacy needed the diagnosis code to process the medication. This RN called the pharmacy at [phone]  and reported the diagnosis code and they will fill the prescription.    Reason for Disposition  General information question, no triage required and triager able to answer question  Answer Assessment - Initial Assessment Questions 1. REASON FOR CALL or QUESTION: "What is your reason for calling today?" or "How can I best help you?" or "What question do you have that I can help answer?"     Patient at pharmacy and needs diagnosis code for prescription.  Protocols used: Information Only Call - No Triage-A-AH

## 2023-07-13 NOTE — Telephone Encounter (Signed)
That would be K52.9 - acute gastroenteritis

## 2023-07-13 NOTE — Telephone Encounter (Signed)
Pharmacy needs icd10 code so they can fill his diphenoxylate-atropine (LOMOTIL) 2.5-0.025 MG table.  Please call [id number]  Phone: [phone]  Fax: [phone]

## 2023-07-13 NOTE — Progress Notes (Signed)
Patient ID: Derek Tucker, male   DOB: [birth year], 53 y.o.   MRN: [id number]  Virtual Visit via Video Note  I connected with Derek Tucker on 07/17/23 at 11:00 AM EST by a video enabled telemedicine application and verified that I am speaking with the correct person using two identifiers.  Location of all participants today Patient: in [facility] on business trip Provider: at office   I discussed the limitations of evaluation and management by telemedicine and the availability of in person appointments. The patient expressed understanding and agreed to proceed.  History of Present Illness: Here with 3 days onset GI symptoms of fatigue, malaise, n/v ,crampy abd pains and watery diarrhea multiple episodes per day, Denies worsening reflux, dysphagia or blood.  Has been keeping up with fluids well and Pt denies chest pain, increased sob or doe, wheezing, orthopnea, PND, increased LE swelling, palpitations, dizziness or syncope.   Pt denies polydipsia, polyuria, or new focal neuro s/s.   Multiple other co workers in the setting ill with same.  Pt denies fever, wt loss, night sweats, loss of appetite, or other constitutional symptoms   Past Medical History:  Diagnosis Date   Allergy    Anxiety    Arthritis    Asthma    Depression    GERD (gastroesophageal reflux disease)    History of gallstones    Past Surgical History:  Procedure Laterality Date   APPENDECTOMY  06/15/1980   CHOLECYSTECTOMY  2017   HERNIA REPAIR N/A 2005   Umbilical   KNEE ARTHROSCOPY  06/15/2010   TONSILLECTOMY  08/28/2021    reports that he has quit smoking. He has never used smokeless tobacco. He reports current alcohol use of about 3.0 standard drinks of alcohol per week. He reports that he does not currently use drugs. family history includes Arthritis in his mother; Arthritis/Rheumatoid in his mother; Depression in his brother and father; Heart disease in his father; Hypertension in his father and  mother; Lupus in his sister; Psoriasis in his sister. Allergies  Allergen Reactions   Dulera [Mometasone Furo-Formoterol Fum]     headache   Imitrex [Sumatriptan] Other (See Comments)    Chest pain and numbness   Prilosec [Omeprazole] Other (See Comments)    Headache   Shellfish Allergy Rash    GI intolerance   Current Outpatient Medications on File Prior to Visit  Medication Sig Dispense Refill   albuterol (VENTOLIN HFA) 108 (90 Base) MCG/ACT inhaler INHALE 2 PUFFS BY MOUTH EVERY 6 HOURS AS NEEDED FOR WHEEZING OR  SHORTNESS  OF  BREATH. 18 g 0   beclomethasone (QVAR REDIHALER) 40 MCG/ACT inhaler Inhale 1 puff into the lungs 2 (two) times daily. 10.6 each 1   cyanocobalamin (VITAMIN B12) 1000 MCG/ML injection INJECT 1 ML (1000 MCG TOTAL) INTRAMUSCULARLY ONCE EVERY 30 DAYS 1 mL 0   EPINEPHrine 0.3 mg/0.3 mL IJ SOAJ injection  (Patient not taking: Reported on 03/31/2023)     fish oil-omega-3 fatty acids 1000 MG capsule Take 2 g by mouth daily.     fluticasone (FLONASE) 50 MCG/ACT nasal spray Place 4 sprays into the nose daily. 16 g 11   pantoprazole (PROTONIX) 40 MG tablet Take 1 tablet (40 mg total) by mouth daily. 90 tablet 1   pramipexole (MIRAPEX) 0.25 MG tablet Take 0.25 mg by mouth daily.     pramipexole (MIRAPEX) 1 MG tablet TAKE 1 TABLET BY MOUTH AT BEDTIME 90 tablet 1   Spacer/Aero Chamber Mouthpiece MISC 1  Act by Does not apply route 2 (two) times daily. 1 each 11   SPRAVATO, 84 MG DOSE, 28 MG/DEVICE SOPK Place 1 spray into both nostrils once a week.     SYRINGE-NEEDLE, DISP, 3 ML (B-D SYRINGE/NEEDLE 3CC/23GX1") 23G X 1" 3 ML MISC Use to inject B12 injection monthly. 50 each 0   UBRELVY 100 MG TABS TAKE 1 TABLET BY MOUTH ONCE DAILY AS NEEDED 16 tablet 2   VIIBRYD 10 MG TABS Take 10 mg by mouth.     No current facility-administered medications on file prior to visit.    Observations/Objective: Alert, NAD, appropriate mood and affect, resps normal, cn 2-12 intact, moves all 4s,  no visible rash or swelling Lab Results  Component Value Date   WBC 6.6 03/19/2023   HGB 14.8 03/19/2023   HCT 44.3 03/19/2023   PLT 314.0 03/19/2023   GLUCOSE 106 (H) 03/19/2023   CHOL 121 03/19/2023   TRIG 136.0 03/19/2023   HDL 39.90 03/19/2023   LDLDIRECT 71.0 02/14/2021   LDLCALC 54 03/19/2023   ALT 34 03/19/2023   AST 33 03/19/2023   NA 137 03/19/2023   K 3.9 03/19/2023   CL 102 03/19/2023   CREATININE 0.98 03/19/2023   BUN 8 03/19/2023   CO2 26 03/19/2023   TSH 2.24 03/19/2023   PSA 0.24 04/23/2022   Assessment and Plan: See notes  Follow Up Instructions: See notes   I discussed the assessment and treatment plan with the patient. The patient was provided an opportunity to ask questions and all were answered. The patient agreed with the plan and demonstrated an understanding of the instructions.   The patient was advised to call back or seek an in-person evaluation if the symptoms worsen or if the condition fails to improve as anticipated.   Oliver Barre, MD

## 2023-07-14 NOTE — Telephone Encounter (Signed)
DX code has been given.

## 2023-07-14 NOTE — Telephone Encounter (Signed)
ICD 10 was given yesterday by another staff member.

## 2023-07-17 ENCOUNTER — Encounter: Payer: Self-pay | Admitting: Internal Medicine

## 2023-07-17 NOTE — Patient Instructions (Signed)
 Please take all new medication as prescribed

## 2023-07-17 NOTE — Assessment & Plan Note (Signed)
Acute onset mild to mod with out s/s dehydration it seems, for zofran odt prn, lomotil prn, and f/u UC or ED locally for any worsening fever, pain, vomiting or blood.

## 2023-07-17 NOTE — Assessment & Plan Note (Signed)
Stable, cont protonix 40 qd

## 2023-07-17 NOTE — Assessment & Plan Note (Signed)
Stable overall, continue inhaler prn 

## 2023-08-03 ENCOUNTER — Other Ambulatory Visit: Payer: Self-pay | Admitting: Internal Medicine

## 2023-08-03 NOTE — Telephone Encounter (Signed)
We have received a notice from [employer] with [employer] [employer], [employer] [place] indicating that an independent external reviewer has been assigned to case [facility] File Number: [id number]). A final decision on TIF should be made by 09/16/23. External Review Organization: Attn: [employer] [address] [place], [place] [address] Phone [phone] Fax [phone]

## 2023-08-10 ENCOUNTER — Other Ambulatory Visit: Payer: Self-pay | Admitting: Internal Medicine

## 2023-08-10 MED ORDER — SCOPOLAMINE 1 MG/3DAYS TD PT72: 1.0000 | MEDICATED_PATCH | TRANSDERMAL | 0 refills | Status: DC

## 2023-09-16 ENCOUNTER — Telehealth: Payer: Self-pay

## 2023-09-16 NOTE — Telephone Encounter (Signed)
 Received an email from April Nealey, Case Manager with [employer] TIF Patient Access Program informing us that external reviewed overturned previous denial. We are waiting for patient's insurance to be notified and update PA on their end. Once PA is updated and approved we will be notified and can contact patient to schedule.   MyChart message sent to patient with an update.

## 2023-09-20 ENCOUNTER — Other Ambulatory Visit: Payer: Self-pay | Admitting: Internal Medicine

## 2023-09-20 ENCOUNTER — Telehealth: Payer: Self-pay | Admitting: Gastroenterology

## 2023-09-20 ENCOUNTER — Other Ambulatory Visit: Payer: Self-pay

## 2023-09-20 DIAGNOSIS — K2100 Gastro-esophageal reflux disease with esophagitis, without bleeding: Secondary | ICD-10-CM

## 2023-09-20 DIAGNOSIS — E538 Deficiency of other specified B group vitamins: Secondary | ICD-10-CM

## 2023-09-20 NOTE — Telephone Encounter (Signed)
 Patient called and stated that he is needing to scheduling a TIF procedure do to him receiving a letter stated that he was approved for the TIF procedure. Patient is requesting a call back. Please advise.    Received email with insurance approval this afternoon.

## 2023-09-20 NOTE — Telephone Encounter (Signed)
 Patient called and stated that he is needing to scheduling a TIF procedure do to him receiving a letter stated that he was approved for the TIF procedure. Patient is requesting a call back. Please advise.

## 2023-09-20 NOTE — Telephone Encounter (Signed)
 Called and spoke with patient. Patient is unavailable for TIF on 10/26/23. Patient is scheduled for TIF on Tuesday, 11/23/23 at 7:30 am, arriving at 6 am with a care partner. Patient has been scheduled for post-TIF follow up appt on 12/09/23 at 3:40 pm. Patient knows to expect instructions and diet via MyChart and a copy in the mail. Pt verbalized understanding and had no concerns at the end of the call.

## 2023-09-20 NOTE — Addendum Note (Signed)
 Addended by: Marisa Sprinkles on: 09/20/2023 04:49 PM   Modules accepted: Orders

## 2023-09-20 NOTE — Telephone Encounter (Signed)
See 4/3 TE

## 2023-09-24 ENCOUNTER — Other Ambulatory Visit: Payer: Self-pay | Admitting: Internal Medicine

## 2023-09-24 DIAGNOSIS — G43009 Migraine without aura, not intractable, without status migrainosus: Secondary | ICD-10-CM

## 2023-10-01 ENCOUNTER — Other Ambulatory Visit: Payer: Self-pay | Admitting: Internal Medicine

## 2023-10-01 DIAGNOSIS — E538 Deficiency of other specified B group vitamins: Secondary | ICD-10-CM

## 2023-10-05 ENCOUNTER — Other Ambulatory Visit: Payer: Self-pay | Admitting: Internal Medicine

## 2023-11-04 ENCOUNTER — Telehealth: Payer: Self-pay | Admitting: Gastroenterology

## 2023-11-04 NOTE — Telephone Encounter (Signed)
 Patient called and stated that he is needing to know weather or not Dr. Karene Oto is need to do a Upper endoscopy to see if the instrument will fit in order to stretch his esophagus. Patient is requesting a call back. Please advise.

## 2023-11-05 ENCOUNTER — Other Ambulatory Visit: Payer: Self-pay

## 2023-11-05 DIAGNOSIS — K222 Esophageal obstruction: Secondary | ICD-10-CM

## 2023-11-05 NOTE — Telephone Encounter (Signed)
 EGD scheduled for 11/10/23 at 7:30 am in the [facility] with Dr. Karene Oto. Spoke with Abe Abed in the [facility] & got the ok to overbook as well. Pt is aware & has been sent instructions in mychart. No blood thinners/diabetic medications.

## 2023-11-05 NOTE — Telephone Encounter (Signed)
 Patient called in to see if he will need an EGD prior to the TIF that is scheduled for 11/23/23. States Dr. Karene Oto mentioned he may need one 2 weeks prior to the TIF to see if the tools will fit. Advised pt I would give him a call back after Dr. Karene Oto has reviewed.

## 2023-11-06 ENCOUNTER — Other Ambulatory Visit: Payer: Self-pay | Admitting: Gastroenterology

## 2023-11-06 DIAGNOSIS — R1310 Dysphagia, unspecified: Secondary | ICD-10-CM

## 2023-11-09 ENCOUNTER — Other Ambulatory Visit (HOSPITAL_COMMUNITY): Payer: Self-pay

## 2023-11-09 ENCOUNTER — Encounter: Payer: Self-pay | Admitting: Gastroenterology

## 2023-11-09 ENCOUNTER — Telehealth: Payer: Self-pay

## 2023-11-09 NOTE — Telephone Encounter (Signed)
 PA request has been Submitted. New Encounter has been or will be created for follow up. For additional info see Pharmacy Prior Auth telephone encounter from 05/27.

## 2023-11-09 NOTE — Telephone Encounter (Signed)
*  Gastro  Pharmacy Patient Advocate Encounter   Received notification from Patient Advice Request messages that prior authorization for Pantoprazole  Sodium 40MG  dr tablets  is required/requested.   Insurance verification completed.   The patient is insured through [employer] .   Per test claim: PA required; PA submitted to above mentioned insurance via CoverMyMeds Key/confirmation #/EOC [id number] Status is pending

## 2023-11-10 ENCOUNTER — Telehealth: Payer: Self-pay

## 2023-11-10 ENCOUNTER — Ambulatory Visit (AMBULATORY_SURGERY_CENTER): Admitting: Gastroenterology

## 2023-11-10 ENCOUNTER — Encounter: Payer: Self-pay | Admitting: Gastroenterology

## 2023-11-10 VITALS — BP 115/71 | HR 80 | Temp 98.4°F | Resp 14 | Ht 71.0 in | Wt 194.4 lb

## 2023-11-10 DIAGNOSIS — K222 Esophageal obstruction: Secondary | ICD-10-CM | POA: Diagnosis present

## 2023-11-10 DIAGNOSIS — K2100 Gastro-esophageal reflux disease with esophagitis, without bleeding: Secondary | ICD-10-CM

## 2023-11-10 DIAGNOSIS — R1110 Vomiting, unspecified: Secondary | ICD-10-CM

## 2023-11-10 MED ORDER — SODIUM CHLORIDE 0.9 % IV SOLN: 500.0000 mL | Freq: Once | INTRAVENOUS | Status: DC

## 2023-11-10 NOTE — Telephone Encounter (Signed)
-----   Message from Annis Kinder sent at 11/10/2023  8:10 AM EDT ----- Can you please send this patient the handout for postoperative diet and activity/exercise.  He is scheduled for TIF in 6/10.  Can also remind him to do liquid diet only for the day prior.  Thank you.

## 2023-11-10 NOTE — Progress Notes (Signed)
 Called to room to assist during endoscopic procedure.  Patient ID and intended procedure confirmed with present staff. Received instructions for my participation in the procedure from the performing physician.

## 2023-11-10 NOTE — Progress Notes (Signed)
 GASTROENTEROLOGY PROCEDURE H&P NOTE   Primary Care Physician: Arcadio Knuckles, MD    Reason for Procedure:   GERD, peptic stricture, preoperative evaluation  Plan:    EGD with dilation and/or biopsy  Patient is appropriate for endoscopic procedure(s) in the ambulatory ([facility]) setting.  The nature of the procedure, as well as the risks, benefits, and alternatives were carefully and thoroughly reviewed with the patient. Ample time for discussion and questions allowed. The patient understood, was satisfied, and agreed to proceed.     HPI: Derek Tucker is a 53 y.o. male who presents for EGD for evaluation of GERD and peptic stricture with dilation of peptic stricture prior to upcoming Transoral Incisionless Fundoplication (TIF).  Endoscopic History: - 08/2019: EGD: Benign-appearing esophageal stenosis dilated with endoscope alone, LA Grade D esophagitis - 06/04/2022: EGD: LA Grade C esophagitis, One benign-appearing, intrinsic moderate to severe stenosis was found at the GEJ, dilated with the endoscope alone.  Normal stomach and duodenum. - 08/14/2022: EGD: LA Grade C esophagitis. Possible short segment of Barrett's near the GEJ but hard to tell in light of inflammatory changes Findings: - One benign-appearing, intrinsic stenosis was found at the GEJ - passage with the endoscope dilated it slightly but lumen was more patent than the last exam. - Mucosal changes including ringed esophagus and longitudinal furrows were found in the mid esophagus. Biopsies negative for eosinophilic esophagitis. Food (residue) was found in the gastric body prohibiting views of the stomach. Once this was noted the procedure was aborted, full exam not performed. - 10/06/2022: EGD: Z line just slightly irregular - does not meet criteria of Barrett's. Interval healing of esophagitis.  Hill grade 1 valve, benign-appearing, intrinsic mild stenosis was found 40 cm from the incisors, dilated with 16 mm and 17 mm TTS  balloon after which appropriate dilation effect was seen. - The exam of the esophagus was otherwise normal. Interval healing of esophagitis on PPI. - The entire examined stomach was normal. - The examined duodenum was normal.   Past Medical History:  Diagnosis Date   Allergy    Anxiety    Arthritis    Asthma    Depression    GERD (gastroesophageal reflux disease)    History of gallstones     Past Surgical History:  Procedure Laterality Date   APPENDECTOMY  06/15/1980   CHOLECYSTECTOMY  2017   HERNIA REPAIR N/A 2005   Umbilical   KNEE ARTHROSCOPY  06/15/2010   TONSILLECTOMY  08/28/2021    Prior to Admission medications   Medication Sig Start Date End Date Taking? Authorizing Provider  albuterol  (VENTOLIN  HFA) 108 (90 Base) MCG/ACT inhaler INHALE 2 PUFFS BY MOUTH EVERY 6 HOURS AS NEEDED FOR WHEEZING OR  SHORTNESS  OF  BREATH. 05/16/23  Yes Arcadio Knuckles, MD  fluticasone  (FLONASE ) 50 MCG/ACT nasal spray Place 4 sprays into the nose daily. 04/06/13  Yes Arcadio Knuckles, MD  pantoprazole  (PROTONIX ) 40 MG tablet TAKE 1 TABLET BY MOUTH TWICE DAILY FOR  6  WEEKS  THEN  ONCE  DAILY  THEREAFTER. 11/09/23  Yes Armbruster, Lendon Queen, MD  pramipexole  (MIRAPEX ) 0.25 MG tablet Take 0.25 mg by mouth daily. 03/10/23  Yes [provider]  pramipexole  (MIRAPEX ) 1 MG tablet TAKE 1 TABLET BY MOUTH AT BEDTIME 06/13/22  Yes Arcadio Knuckles, MD  Spacer/Aero Chamber Mouthpiece MISC 1 Act by Does not apply route 2 (two) times daily. 04/06/13  Yes Arcadio Knuckles, MD  SPRAVATO, 84 MG DOSE,  28 MG/DEVICE SOPK Place 1 spray into both nostrils once a week. 11/10/22  Yes [provider]  beclomethasone (QVAR  REDIHALER) 40 MCG/ACT inhaler Inhale 1 puff into the lungs 2 (two) times daily. 03/16/23   Arcadio Knuckles, MD  cyanocobalamin  (VITAMIN B12) 1000 MCG/ML injection INJECT 1 ML (1000 MCG TOTAL) INTRAMUSCULARLY ONCE EVERY 30 DAYS 07/04/23   Arcadio Knuckles, MD  diphenoxylate -atropine  (LOMOTIL )  2.5-0.025 MG tablet Take 1 tablet by mouth 4 (four) times daily as needed for diarrhea or loose stools. 07/13/23   Roslyn Coombe, MD  EPINEPHrine 0.3 mg/0.3 mL IJ SOAJ injection  10/06/21   [provider]  fish oil-omega-3 fatty acids 1000 MG capsule Take 2 g by mouth daily.    [provider]  ondansetron  (ZOFRAN -ODT) 4 MG disintegrating tablet Take 1 tablet (4 mg total) by mouth every 8 (eight) hours as needed. 07/13/23   Roslyn Coombe, MD  scopolamine  (TRANSDERM-SCOP) 1 MG/3DAYS Place 1 patch (1.5 mg total) onto the skin every 3 (three) days. 08/10/23   Arcadio Knuckles, MD  SYRINGE-NEEDLE, DISP, 3 ML (B-D SYRINGE/NEEDLE 3CC/23GX1") 23G X 1" 3 ML MISC Use to inject B12 injection monthly. 03/16/23   Arcadio Knuckles, MD  UBRELVY  100 MG TABS TAKE 1 TABLET BY MOUTH ONCE DAILY AS NEEDED 03/28/23   Arcadio Knuckles, MD  VIIBRYD 10 MG TABS Take 10 mg by mouth. 01/14/23   [provider]    Current Outpatient Medications  Medication Sig Dispense Refill   albuterol  (VENTOLIN  HFA) 108 (90 Base) MCG/ACT inhaler INHALE 2 PUFFS BY MOUTH EVERY 6 HOURS AS NEEDED FOR WHEEZING OR  SHORTNESS  OF  BREATH. 18 g 0   fluticasone  (FLONASE ) 50 MCG/ACT nasal spray Place 4 sprays into the nose daily. 16 g 11   pantoprazole  (PROTONIX ) 40 MG tablet TAKE 1 TABLET BY MOUTH TWICE DAILY FOR  6  WEEKS  THEN  ONCE  DAILY  THEREAFTER. 90 tablet 0   pramipexole  (MIRAPEX ) 0.25 MG tablet Take 0.25 mg by mouth daily.     pramipexole  (MIRAPEX ) 1 MG tablet TAKE 1 TABLET BY MOUTH AT BEDTIME 90 tablet 1   Spacer/Aero Chamber Mouthpiece MISC 1 Act by Does not apply route 2 (two) times daily. 1 each 11   SPRAVATO, 84 MG DOSE, 28 MG/DEVICE SOPK Place 1 spray into both nostrils once a week.     beclomethasone (QVAR  REDIHALER) 40 MCG/ACT inhaler Inhale 1 puff into the lungs 2 (two) times daily. 10.6 each 1   cyanocobalamin  (VITAMIN B12) 1000 MCG/ML injection INJECT 1 ML (1000 MCG TOTAL) INTRAMUSCULARLY ONCE EVERY 30 DAYS  1 mL 0   diphenoxylate -atropine  (LOMOTIL ) 2.5-0.025 MG tablet Take 1 tablet by mouth 4 (four) times daily as needed for diarrhea or loose stools. 30 tablet 1   EPINEPHrine 0.3 mg/0.3 mL IJ SOAJ injection  (Patient not taking: Reported on 03/31/2023)     fish oil-omega-3 fatty acids 1000 MG capsule Take 2 g by mouth daily.     ondansetron  (ZOFRAN -ODT) 4 MG disintegrating tablet Take 1 tablet (4 mg total) by mouth every 8 (eight) hours as needed. 30 tablet 1   scopolamine  (TRANSDERM-SCOP) 1 MG/3DAYS Place 1 patch (1.5 mg total) onto the skin every 3 (three) days. 4 patch 0   SYRINGE-NEEDLE, DISP, 3 ML (B-D SYRINGE/NEEDLE 3CC/23GX1") 23G X 1" 3 ML MISC Use to inject B12 injection monthly. 50 each 0   UBRELVY  100 MG TABS TAKE 1 TABLET BY MOUTH ONCE  DAILY AS NEEDED 16 tablet 2   VIIBRYD 10 MG TABS Take 10 mg by mouth.     Current Facility-Administered Medications  Medication Dose Route Frequency Provider Last Rate Last Admin   0.9 %  sodium chloride  infusion  500 mL Intravenous Once Nashaun Hillmer V, DO        Allergies as of 11/10/2023 - Review Complete 11/10/2023  Allergen Reaction Noted   Dulera [mometasone  furo-formoterol  fum] Other (See Comments) 04/06/2013   Imitrex  [sumatriptan ] Other (See Comments) 06/20/2020   Prilosec [omeprazole ] Other (See Comments) 08/14/2022   Shellfish allergy Rash 03/18/2022    Family History  Problem Relation Age of Onset   Arthritis Mother    Hypertension Mother    Arthritis/Rheumatoid Mother    Depression Father    Heart disease Father    Hypertension Father    Psoriasis Sister    Lupus Sister    Depression Brother    Asthma Neg Hx    Alcohol abuse Neg Hx    Cancer Neg Hx    COPD Neg Hx    Diabetes Neg Hx    Drug abuse Neg Hx    Hyperlipidemia Neg Hx    Kidney disease Neg Hx    Stroke Neg Hx    Colon cancer Neg Hx    Esophageal cancer Neg Hx    Stomach cancer Neg Hx    Rectal cancer Neg Hx     Social History   Socioeconomic History    Marital status: Media planner    Spouse name: Not on file   Number of children: Not on file   Years of education: Not on file   Highest education level: Not on file  Occupational History   Not on file  Tobacco Use   Smoking status: Former    Types: Cigarettes   Smokeless tobacco: Never  Vaping Use   Vaping status: Never Used  Substance and Sexual Activity   Alcohol use: Yes    Alcohol/week: 3.0 standard drinks of alcohol    Types: 3 Glasses of wine per week    Comment: occ   Drug use: Not Currently   Sexual activity: Not Currently    Partners: Male  Other Topics Concern   Not on file  Social History Narrative   Not on file   Social Drivers of Health   Financial Resource Strain: Not on file  Food Insecurity: Not on file  Transportation Needs: Not on file  Physical Activity: Not on file  Stress: Not on file  Social Connections: Not on file  Intimate Partner Violence: Not on file    Physical Exam: Vital signs in last 24 hours: @BP  139/87   Pulse 84   Temp 98.4 F (36.9 C) (Temporal)   Ht 5\' 11"  (1.803 m)   Wt 194 lb 6.4 oz (88.2 kg)   SpO2 99%   BMI 27.11 kg/m  GEN: NAD EYE: Sclerae anicteric ENT: MMM CV: Non-tachycardic Pulm: CTA b/l GI: Soft, NT/ND NEURO:  Alert & Oriented x 3   Harry Lindau, DO [facility]   11/10/2023 7:37 AM

## 2023-11-10 NOTE — Patient Instructions (Addendum)
 - Patient has a contact number available for                            emergencies. The signs and symptoms of potential                            delayed complications were discussed with the                            patient. Return to normal activities tomorrow.                            Written discharge instructions were provided to the                            patient.                           - Soft diet today then advance as tolerated                            tomorrow per postdilation protocol.                           - Proceed with Transoral Incision was                            Fundoplication (TIF) as scheduled.                           - Continue present medications.  YOU HAD AN ENDOSCOPIC PROCEDURE TODAY AT THE [facility]:   Refer to the procedure report that was given to you for any specific questions about what was found during the examination.  If the procedure report does not answer your questions, please call your gastroenterologist to clarify.  If you requested that your care partner not be given the details of your procedure findings, then the procedure report has been included in a sealed envelope for you to review at your convenience later.  YOU SHOULD EXPECT: Some feelings of bloating in the abdomen. Passage of more gas than usual.  Walking can help get rid of the air that was put into your GI tract during the procedure and reduce the bloating. If you had a lower endoscopy (such as a colonoscopy or flexible sigmoidoscopy) you may notice spotting of blood in your stool or on the toilet paper. If you underwent a bowel prep for your procedure, you may not have a normal bowel movement for a few days.  Please Note:  You might notice some irritation and congestion in your nose or some drainage.  This is from the oxygen used during your procedure.  There is no need for concern and it should clear up in a day or so.  SYMPTOMS TO  REPORT IMMEDIATELY:   Following upper endoscopy (EGD)  Vomiting of blood or coffee ground material  New chest pain or pain under the shoulder blades  Painful or persistently difficult swallowing  New shortness of breath  Fever of 100F or higher  Black, tarry-looking stools  For  urgent or emergent issues, a gastroenterologist can be reached at any hour by calling [phone]. Do not use MyChart messaging for urgent concerns.    DIET:  We do recommend a small meal at first, but then you may proceed to your regular diet.  Drink plenty of fluids but you should avoid alcoholic beverages for 24 hours.  ACTIVITY:  You should plan to take it easy for the rest of today and you should NOT DRIVE or use heavy machinery until tomorrow (because of the sedation medicines used during the test).    FOLLOW UP: Our staff will call the number listed on your records the next business day following your procedure.  We will call around 7:15- 8:00 am to check on you and address any questions or concerns that you may have regarding the information given to you following your procedure. If we do not reach you, we will leave a message.     If any biopsies were taken you will be contacted by phone or by letter within the next 1-3 weeks.  Please call us  at [phone] if you have not heard about the biopsies in 3 weeks.    SIGNATURES/CONFIDENTIALITY: You and/or your care partner have signed paperwork which will be entered into your electronic medical record.  These signatures attest to the fact that that the information above on your After Visit Summary has been reviewed and is understood.  Full responsibility of the confidentiality of this discharge information lies with you and/or your care-partner.

## 2023-11-10 NOTE — Op Note (Signed)
 [facility] Patient Name: Derek Tucker Procedure Date: 11/10/2023 7:26 AM MRN: [id number] Endoscopist: Harry Lindau , MD, [id number] Age: 53 Referring MD:  Date of Birth: 10/13/1970 Gender: Male Account #: [id number] Procedure:                Upper GI endoscopy Indications:              Esophageal reflux, Follow-up of esophageal                            stricture, For therapy of esophageal stricture,                            Preoperative assessment prior to TIF Medicines:                Monitored Anesthesia Care Procedure:                Pre-Anesthesia Assessment:                           - Prior to the procedure, a History and Physical                            was performed, and patient medications and                            allergies were reviewed. The patient's tolerance of                            previous anesthesia was also reviewed. The risks                            and benefits of the procedure and the sedation                            options and risks were discussed with the patient.                            All questions were answered, and informed consent                            was obtained. Prior Anticoagulants: The patient has                            taken no anticoagulant or antiplatelet agents. ASA                            Grade Assessment: II - A patient with mild systemic                            disease. After reviewing the risks and benefits,                            the patient was deemed in satisfactory condition to  undergo the procedure.                           After obtaining informed consent, the endoscope was                            passed under direct vision. Throughout the                            procedure, the patient's blood pressure, pulse, and                            oxygen saturations were monitored continuously. The                            GIF HQ190 #[id number]  was introduced through the                            mouth, and advanced to the second part of duodenum.                            The upper GI endoscopy was accomplished without                            difficulty. The patient tolerated the procedure                            well. Scope In: Scope Out: Findings:                 The upper third of the esophagus and middle third                            of the esophagus were normal.                           One benign-appearing, intrinsic mild stenosis was                            found 40 cm from the incisors. This stenosis                            measured less than one cm (in length). The stenosis                            was traversed. A TTS dilator was passed through the                            scope. Dilation with an 18-19-20 mm balloon dilator                            was performed to 19 mm. The dilation site was  examined and showed mild mucosal disruption.                            Estimated blood loss was minimal.                           The Z-line was regular and was found 41 cm from the                            incisors.                           A small amount of food (residue) was found in the                            gastric body.                           The visualized mucosa was otherwise normal                            throughout the stomach.                           The examined duodenum was normal. Complications:            No immediate complications. Estimated Blood Loss:     Estimated blood loss was minimal. Impression:               - Normal upper third of esophagus and middle third                            of esophagus.                           - Benign-appearing esophageal stenosis. Dilated                            with 19 mm TTS balloon with appropriate mucosal                            disruption consistent with successful dilation.                            - Z-line regular, 41 cm from the incisors.                           - A small amount of food (residue) in the stomach.                           - Normal mucosa was found in the entire stomach.                           - Normal examined duodenum.                           -  No specimens collected. Recommendation:           - Patient has a contact number available for                            emergencies. The signs and symptoms of potential                            delayed complications were discussed with the                            patient. Return to normal activities tomorrow.                            Written discharge instructions were provided to the                            patient.                           - Soft diet today then advance as tolerated                            tomorrow per postdilation protocol.                           - Proceed with Transoral Incision was                            Fundoplication (TIF) as scheduled.                           - Continue present medications. Harry Lindau, MD 11/10/2023 8:01:09 AM

## 2023-11-10 NOTE — Telephone Encounter (Signed)
 Attempted to reach patient by phone twice. His phone goes straight to voicemail. MyChart message sent.

## 2023-11-10 NOTE — Progress Notes (Signed)
 Vss nad trans to pacu

## 2023-11-10 NOTE — Progress Notes (Signed)
 Pt had oral surgery 3 weeks ago. Reports all stitches have been dissolved.

## 2023-11-11 ENCOUNTER — Telehealth: Payer: Self-pay | Admitting: *Deleted

## 2023-11-11 NOTE — Telephone Encounter (Signed)
  Follow up Call-     11/10/2023    7:08 AM 10/06/2022    9:31 AM 08/14/2022    9:46 AM 06/04/2022    1:02 PM  Call back number  Post procedure Call Back phone  # [phone]  Permission to leave phone message Yes Yes Yes Yes     Patient questions:  Do you have a fever, pain , or abdominal swelling? No. Pain Score  0 *  Have you tolerated food without any problems? Yes.    Have you been able to return to your normal activities? Yes.    Do you have any questions about your discharge instructions: Diet   No. Medications  No. Follow up visit  No.  Do you have questions or concerns about your Care? No.  Actions: * If pain score is 4 or above: No action needed, pain <4.

## 2023-11-12 NOTE — Telephone Encounter (Signed)
 Pharmacy Patient Advocate Encounter  Received notification from [employer] that Prior Authorization for Pantoprazole  Sodium 40MG  dr tablets has been APPROVED from 11-09-2023 to 11-08-2024   PA #/Case ID/Reference #: [id number]

## 2023-11-16 ENCOUNTER — Encounter (HOSPITAL_COMMUNITY): Payer: Self-pay | Admitting: Gastroenterology

## 2023-11-16 NOTE — Progress Notes (Signed)
 Attempted to obtain medical history for pre op call via telephone, unable to reach at this time. HIPAA compliant voicemail message left requesting return call to pre surgical testing department.

## 2023-11-18 ENCOUNTER — Telehealth: Payer: Self-pay

## 2023-11-18 ENCOUNTER — Other Ambulatory Visit: Payer: Self-pay | Admitting: Internal Medicine

## 2023-11-18 DIAGNOSIS — J4530 Mild persistent asthma, uncomplicated: Secondary | ICD-10-CM

## 2023-11-18 DIAGNOSIS — E538 Deficiency of other specified B group vitamins: Secondary | ICD-10-CM

## 2023-11-18 DIAGNOSIS — G43009 Migraine without aura, not intractable, without status migrainosus: Secondary | ICD-10-CM

## 2023-11-18 NOTE — Telephone Encounter (Signed)
 Procedure:EGD Procedure date: 11/23/23 Procedure location: [facility] Arrival Time: 6:45 Spoke with the patient Y/N: N Any prep concerns? N  Has the patient obtained the prep from the pharmacy ? N Do you have a care partner and transportation: N Any additional concerns? N   Patient was contacted twice th call went to voice mail. I left a detailed message about the procedure when I called the third time. I stated if he had questions are concerns to contact us  at the office.

## 2023-11-19 NOTE — Telephone Encounter (Signed)
 Pt was contacted in regard to the message below. Pt stated that he was good to go for the procedure and that he does have the prep and a care partner and has no questions or concerns. Pt verbalized understanding with all questions answered.

## 2023-11-22 NOTE — Anesthesia Preprocedure Evaluation (Addendum)
 Anesthesia Evaluation  Patient identified by MRN, date of birth, ID band Patient awake    Reviewed: Allergy & Precautions, NPO status , Patient's Chart, lab work & pertinent test results  Airway Mallampati: II  TM Distance: >3 FB Neck ROM: Full    Dental no notable dental hx. (+) Teeth Intact, Dental Advisory Given   Pulmonary asthma , former smoker   Pulmonary exam normal breath sounds clear to auscultation       Cardiovascular Exercise Tolerance: Good Normal cardiovascular exam Rhythm:Regular Rate:Normal     Neuro/Psych  Headaches  Anxiety Depression     Neuromuscular disease    GI/Hepatic Neg liver ROS,GERD  Medicated and Controlled,,  Endo/Other    Renal/GU      Musculoskeletal   Abdominal   Peds  Hematology   Anesthesia Other Findings All Dulera, imitrex , prilosec  Reproductive/Obstetrics                             Anesthesia Physical Anesthesia Plan  ASA: 2  Anesthesia Plan: MAC   Post-op Pain Management: Minimal or no pain anticipated   Induction:   PONV Risk Score and Plan: 1 and Treatment may vary due to age or medical condition and Propofol infusion  Airway Management Planned: Natural Airway and Nasal Cannula  Additional Equipment: None  Intra-op Plan:   Post-operative Plan:   Informed Consent: I have reviewed the patients History and Physical, chart, labs and discussed the procedure including the risks, benefits and alternatives for the proposed anesthesia with the patient or authorized representative who has indicated his/her understanding and acceptance.     Dental advisory given  Plan Discussed with: CRNA and Surgeon  Anesthesia Plan Comments: (EG for GERD and erosive esophagitis)       Anesthesia Quick Evaluation

## 2023-11-23 ENCOUNTER — Telehealth: Payer: Self-pay

## 2023-11-23 ENCOUNTER — Ambulatory Visit (HOSPITAL_COMMUNITY): Payer: Self-pay | Admitting: Certified Registered"

## 2023-11-23 ENCOUNTER — Encounter (HOSPITAL_COMMUNITY): Payer: Self-pay | Admitting: Gastroenterology

## 2023-11-23 ENCOUNTER — Encounter (HOSPITAL_COMMUNITY)
Admission: RE | Disposition: A | Discharge status: Home / Self Care | Payer: Self-pay | Source: Home / Self Care | Attending: Gastroenterology

## 2023-11-23 ENCOUNTER — Ambulatory Visit (HOSPITAL_BASED_OUTPATIENT_CLINIC_OR_DEPARTMENT_OTHER): Payer: Self-pay | Admitting: Certified Registered"

## 2023-11-23 ENCOUNTER — Other Ambulatory Visit: Payer: Self-pay

## 2023-11-23 ENCOUNTER — Ambulatory Visit (HOSPITAL_COMMUNITY)
Admission: RE | Admit: 2023-11-23 | Discharge: 2023-11-23 | Disposition: A | Discharge status: Home / Self Care | Attending: Gastroenterology | Admitting: Gastroenterology

## 2023-11-23 DIAGNOSIS — K2100 Gastro-esophageal reflux disease with esophagitis, without bleeding: Secondary | ICD-10-CM

## 2023-11-23 DIAGNOSIS — Z87891 Personal history of nicotine dependence: Secondary | ICD-10-CM | POA: Insufficient documentation

## 2023-11-23 DIAGNOSIS — R12 Heartburn: Secondary | ICD-10-CM | POA: Diagnosis present

## 2023-11-23 DIAGNOSIS — F32A Depression, unspecified: Secondary | ICD-10-CM | POA: Diagnosis not present

## 2023-11-23 DIAGNOSIS — F419 Anxiety disorder, unspecified: Secondary | ICD-10-CM | POA: Diagnosis not present

## 2023-11-23 DIAGNOSIS — J45909 Unspecified asthma, uncomplicated: Secondary | ICD-10-CM | POA: Insufficient documentation

## 2023-11-23 DIAGNOSIS — R1110 Vomiting, unspecified: Secondary | ICD-10-CM

## 2023-11-23 DIAGNOSIS — K222 Esophageal obstruction: Secondary | ICD-10-CM

## 2023-11-23 DIAGNOSIS — Z79899 Other long term (current) drug therapy: Secondary | ICD-10-CM | POA: Insufficient documentation

## 2023-11-23 DIAGNOSIS — R1310 Dysphagia, unspecified: Secondary | ICD-10-CM

## 2023-11-23 SURGERY — EGD (ESOPHAGOGASTRODUODENOSCOPY)
Anesthesia: Monitor Anesthesia Care

## 2023-11-23 MED ORDER — ONDANSETRON HCL 4 MG/2ML IJ SOLN: 4.0000 mg | Freq: Once | INTRAMUSCULAR | Status: DC | PRN

## 2023-11-23 MED ORDER — MIDAZOLAM HCL 2 MG/2ML IJ SOLN
INTRAMUSCULAR | Status: DC | PRN
  Administered 2023-11-23: 2 mg via INTRAVENOUS

## 2023-11-23 MED ORDER — FENTANYL CITRATE (PF) 100 MCG/2ML IJ SOLN
INTRAMUSCULAR | Status: AC
  Filled 2023-11-23: qty 2

## 2023-11-23 MED ORDER — ONDANSETRON HCL 4 MG/2ML IJ SOLN
INTRAMUSCULAR | Status: AC
  Filled 2023-11-23: qty 2

## 2023-11-23 MED ORDER — OXYCODONE-ACETAMINOPHEN 7.5-325 MG PO TABS: 1.0000 | ORAL_TABLET | Freq: Four times a day (QID) | ORAL | 0 refills | Status: DC | PRN

## 2023-11-23 MED ORDER — DEXAMETHASONE SODIUM PHOSPHATE 10 MG/ML IJ SOLN
INTRAMUSCULAR | Status: DC | PRN
  Administered 2023-11-23: 4 mg via INTRAVENOUS

## 2023-11-23 MED ORDER — DEXAMETHASONE SODIUM PHOSPHATE 10 MG/ML IJ SOLN
8.0000 mg | Freq: Four times a day (QID) | INTRAMUSCULAR | Status: DC
  Administered 2023-11-23: 8 mg via INTRAVENOUS
  Filled 2023-11-23: qty 0.8

## 2023-11-23 MED ORDER — LIDOCAINE HCL (CARDIAC) PF 100 MG/5ML IV SOSY
PREFILLED_SYRINGE | INTRAVENOUS | Status: DC | PRN
  Administered 2023-11-23: 100 mg via INTRAVENOUS

## 2023-11-23 MED ORDER — FAMOTIDINE IN NACL 20-0.9 MG/50ML-% IV SOLN
20.0000 mg | Freq: Once | INTRAVENOUS | Status: AC
  Administered 2023-11-23: 20 mg via INTRAVENOUS
  Filled 2023-11-23: qty 50

## 2023-11-23 MED ORDER — SCOPOLAMINE 1 MG/3DAYS TD PT72
1.0000 | MEDICATED_PATCH | Freq: Once | TRANSDERMAL | Status: DC
  Administered 2023-11-23: 1.5 mg via TRANSDERMAL

## 2023-11-23 MED ORDER — SUCCINYLCHOLINE CHLORIDE 200 MG/10ML IV SOSY
PREFILLED_SYRINGE | INTRAVENOUS | Status: DC | PRN
  Administered 2023-11-23: 100 mg via INTRAVENOUS

## 2023-11-23 MED ORDER — AMISULPRIDE (ANTIEMETIC) 5 MG/2ML IV SOLN: 10.0000 mg | Freq: Once | INTRAVENOUS | Status: DC | PRN

## 2023-11-23 MED ORDER — MIDAZOLAM HCL 2 MG/2ML IJ SOLN
INTRAMUSCULAR | Status: AC
  Filled 2023-11-23: qty 2

## 2023-11-23 MED ORDER — PROPOFOL 500 MG/50ML IV EMUL
INTRAVENOUS | Status: AC
  Filled 2023-11-23: qty 50

## 2023-11-23 MED ORDER — HYDROMORPHONE HCL 1 MG/ML IJ SOLN
INTRAMUSCULAR | Status: AC
  Filled 2023-11-23: qty 2

## 2023-11-23 MED ORDER — ONDANSETRON HCL 4 MG/2ML IJ SOLN
4.0000 mg | Freq: Once | INTRAMUSCULAR | Status: AC
  Administered 2023-11-23: 4 mg via INTRAVENOUS

## 2023-11-23 MED ORDER — ACETAMINOPHEN 10 MG/ML IV SOLN: 1000.0000 mg | Freq: Once | INTRAVENOUS | Status: DC | PRN

## 2023-11-23 MED ORDER — OXYCODONE HCL 5 MG PO TABS: 5.0000 mg | ORAL_TABLET | Freq: Once | ORAL | Status: AC | PRN

## 2023-11-23 MED ORDER — DEXAMETHASONE SODIUM PHOSPHATE 10 MG/ML IJ SOLN
INTRAMUSCULAR | Status: AC
  Filled 2023-11-23: qty 1

## 2023-11-23 MED ORDER — OXYCODONE HCL 5 MG/5ML PO SOLN
5.0000 mg | Freq: Once | ORAL | Status: AC | PRN
  Administered 2023-11-23: 5 mg via ORAL

## 2023-11-23 MED ORDER — CEFAZOLIN SODIUM-DEXTROSE 2-4 GM/100ML-% IV SOLN
2.0000 g | Freq: Once | INTRAVENOUS | Status: AC
  Administered 2023-11-23: 2 g via INTRAVENOUS
  Filled 2023-11-23: qty 100

## 2023-11-23 MED ORDER — LACTATED RINGERS IV SOLN: INTRAVENOUS | Status: DC

## 2023-11-23 MED ORDER — HYDROMORPHONE HCL 1 MG/ML IJ SOLN
0.2500 mg | INTRAMUSCULAR | Status: DC | PRN
  Administered 2023-11-23 (×2): 0.5 mg via INTRAVENOUS

## 2023-11-23 MED ORDER — PROPOFOL 10 MG/ML IV BOLUS
INTRAVENOUS | Status: DC | PRN
  Administered 2023-11-23: 200 mg via INTRAVENOUS

## 2023-11-23 MED ORDER — LIDOCAINE VISCOUS HCL 2 % MT SOLN
15.0000 mL | OROMUCOSAL | Status: DC | PRN
  Administered 2023-11-23: 15 mL via ORAL
  Filled 2023-11-23: qty 15

## 2023-11-23 MED ORDER — OXYCODONE HCL 5 MG/5ML PO SOLN
ORAL | Status: AC
  Filled 2023-11-23: qty 5

## 2023-11-23 MED ORDER — METOCLOPRAMIDE HCL 5 MG PO TABS: 5.0000 mg | ORAL_TABLET | Freq: Three times a day (TID) | ORAL | 0 refills | Status: DC | PRN

## 2023-11-23 MED ORDER — FENTANYL CITRATE (PF) 100 MCG/2ML IJ SOLN
INTRAMUSCULAR | Status: DC | PRN
  Administered 2023-11-23: 100 ug via INTRAVENOUS
  Administered 2023-11-23 (×2): 50 ug via INTRAVENOUS

## 2023-11-23 MED ORDER — SODIUM CHLORIDE 0.9 % IV SOLN: INTRAVENOUS | Status: DC

## 2023-11-23 MED ORDER — PANTOPRAZOLE SODIUM 40 MG PO TBEC: 40.0000 mg | DELAYED_RELEASE_TABLET | Freq: Two times a day (BID) | ORAL | 0 refills | Status: AC

## 2023-11-23 MED ORDER — ONDANSETRON HCL 4 MG PO TABS: 4.0000 mg | ORAL_TABLET | Freq: Every day | ORAL | 1 refills | Status: DC | PRN

## 2023-11-23 MED ORDER — SCOPOLAMINE 1 MG/3DAYS TD PT72
MEDICATED_PATCH | TRANSDERMAL | Status: AC
  Filled 2023-11-23: qty 1

## 2023-11-23 MED ORDER — LIDOCAINE VISCOUS HCL 2 % MT SOLN: 15.0000 mL | Freq: Four times a day (QID) | OROMUCOSAL | 0 refills | Status: DC | PRN

## 2023-11-23 MED ORDER — SUGAMMADEX SODIUM 200 MG/2ML IV SOLN
INTRAVENOUS | Status: DC | PRN
  Administered 2023-11-23: 200 mg via INTRAVENOUS

## 2023-11-23 MED ORDER — ROCURONIUM BROMIDE 10 MG/ML (PF) SYRINGE
PREFILLED_SYRINGE | INTRAVENOUS | Status: DC | PRN
  Administered 2023-11-23: 10 mg via INTRAVENOUS
  Administered 2023-11-23: 30 mg via INTRAVENOUS

## 2023-11-23 SURGICAL SUPPLY — 1 items: KIT ESOPHYX Z+ (Miscellaneous) IMPLANT

## 2023-11-23 NOTE — Op Note (Signed)
 [facility] Patient Name: Derek Tucker Procedure Date: 11/23/2023 MRN: [id number] Attending MD: Harry Lindau , MD, [id number] Date of Birth: 07-31-1970 CSN: [id number] Age: 53 Admit Type: Outpatient Procedure:                Upper GI endoscopy w/ Transoral Incisionless                            Fundoplication (TIF) Indications:              Heartburn, For therapy of reflux esophagitis,                            Regurgitation                           53 year old male with longstanding history of GERD                            complicated by erosive esophagitis and peptic                            stricture, presents today for Transoral                            Incisionless Fundoplication (TIF). Providers:                Harry Lindau, MD, Marden Shaggy, RN, Marvelyn Slim, Technician Referring MD:              Medicines:                General Anesthesia Complications:            No immediate complications. Estimated Blood Loss:     Estimated blood loss was minimal. Procedure:                Pre-Anesthesia Assessment:                           - Prior to the procedure, a History and Physical                            was performed, and patient medications and                            allergies were reviewed. The patient's tolerance of                            previous anesthesia was also reviewed. The risks                            and benefits of the procedure and the sedation                            options and risks were discussed with the patient.  All questions were answered, and informed consent                            was obtained. Prior Anticoagulants: The patient has                            taken no anticoagulant or antiplatelet agents. ASA                            Grade Assessment: II - A patient with mild systemic                            disease. After reviewing the  risks and benefits,                            the patient was deemed in satisfactory condition to                            undergo the procedure.                           After obtaining informed consent, the endoscope was                            passed under direct vision. Throughout the                            procedure, the patient's blood pressure, pulse, and                            oxygen saturations were monitored continuously. The                            GIF-H190 ([id number]) Olympus endoscope was introduced                            through the mouth, and advanced to the second part                            of duodenum. The upper GI endoscopy was                            accomplished without difficulty. The patient                            tolerated the procedure well. Scope In: Scope Out: Findings:      One subtle, benign-appearing, intrinsic mild stenosis was found in the       lower third of the esophagus, consistent with known chronic peptic       stricture. This stenosis measured less than one cm (in length). The       stenosis was traversed. A guidewire was placed and the scope was       withdrawn. Dilation was performed with a Savary dilator with mild  resistance at 17 mm. The dilation site was examined following endoscope       reinsertion and showed mild mucosal disruption. Estimated blood loss was       minimal.      The Z-line was regular and was found 44 cm from the incisors. The       decision was made to perform transoral fundoplication with the EsophyX       Z+ system. Before the procedure, the gastroesophageal flap valve was       classified as Hill Grade II (fold present, opens with respiration). The       endoscope was withdrawn, placed through the plication device, reinserted       into the patient and advanced past the level of the GE junction at 44 cm       from the incisors and into the stomach. Next, the endoscope was advanced        beyond the device and retroflexed. The first plication site was       identified at the 1 o'clock position. With the device in the proper       position, the helical retractor was deployed and tissue was pulled into       the mold before it was closed. The device was rotated, suction was       applied using the invaginator, then the device was advanced slightly and       two H-shaped fasteners were placed. The device was reloaded and the       process repeated in order to deploy a total of ten fasteners at the       first site. The device was then rotated to the 11 o'clock position after       which the helical retractor was used to grasp additional tissue within       the mold before rotation and deployment of a total of six fasteners at       the second site. To complete reconstruction of the valve, additional       fasteners were deployed at the following sites: four fasteners at 5       o'clock and four fasteners at 7 o'clock positions. In total, 24       fasteners contributed to create a valve measuring 3 cm in length which       involved 270 degrees of the circumference upon retroflexed view.       Following the procedure, the flap valve was reclassified as Hill Grade I       (prominent fold, tight to endoscope). The EsophyX device and endoscope       were then removed. Relook endoscopy was performed prior to the       conclusion of the case to confirm the above findings. Estimated blood       loss was minimal.      The entire examined stomach was normal.      The examined duodenum was normal. Impression:               - Benign-appearing esophageal stenosis. Dilated                            with 17 mm Savary dilator with appropriate mucosal                            disruption of the subtle benign  stricture..                           - Z-line regular, 44 cm from the incisors.                           - Normal stomach.                           - Normal examined duodenum.                            - EsophyX transoral fundoplication was performed.                           - No specimens collected. Moderate Sedation:      Not Applicable - Patient had care per Anesthesia. Recommendation:           -Transport to PACU for recovery. Will re-evaluate                            in PACU for either discharge home later today vs                            overnight observation.                           -Zofran  4 mg IV every 6 hours while in house, then                            4 mg every 6 hours as needed                           -Reglan 10 mg every 6 hours x24 hours, then prn                            every 8 hours as needed                           -Resume scopolamine  patch x3 days (applied preop)                           -Protonix  40 mg p.o. BID x2 weeks, then 40 mg daily                            x2 weeks, then 20 mg daily x1 week then prn                           -Decadron 8 mg every 6 hours times max 5 doses                           -Gas-X (simethicone) 80 mg p.o. prn every 6 hours                            gas  pain, abdominal discomfort                           -Tylenol  3 (APAP 120 mg/codeine 12 mg per 5 mL): 15                            mL's every 4 hours prn pain                           -Viscous lidocaine                           -Colace 100 mg p.o. twice daily if taking pain                            medications                           -Clear liquid diet okay overnight                           -Okay to ambulate with assist around the ward                           -Please do not hesitate to contact me directly with                            any postoperative questions or concerns Procedure Code(s):        --- Professional ---                           [phone], Esophagogastroduodenoscopy, flexible,                            transoral; with insertion of guide wire followed by                            passage of dilator(s) through esophagus  over guide                            wire Diagnosis Code(s):        --- Professional ---                           K22.2, Esophageal obstruction                           R12, Heartburn                           K21.00, Gastro-esophageal reflux disease with                            esophagitis, without bleeding                           R11.10, Vomiting, unspecified CPT copyright 2022 American Medical Association.  All rights reserved. The codes documented in this report are preliminary and upon coder review may  be revised to meet current compliance requirements. Harry Lindau, MD 11/23/2023 8:47:41 AM Number of Addenda: 0

## 2023-11-23 NOTE — H&P (Signed)
 @LOGO @            Chief Complaint: GERD with erosive esophagitis     HPI:     Derek Tucker is a 53 y.o. male presenting today for Transoral Incisionless Fundoplication (TIF) with a goal to stop or significantly reduce acid suppression therapy.  He has a longstanding history of GERD c/b erosive esophagitis and peptic stricture.  Reflux sxs for 15+ years. Has had repeat dilations of peptic stricture over the years. Continues to have bothersome regurgitation.  Had headaches with omeprazole  so this was stopped.  No issue tolerating pantoprazole .   Has tried [facility] elevation and significant dietary mods in the past without clinical or endoscopic improvement.   Will still have regurgitation. Worse with forward flexion and post prandial. No longer having dysphagia after most recent EGD with dilation.    GERD history: -Index symptoms: Regurgitation, heartburn -Exacerbating features: Tomato based sauces, coffee, overeating bread, supine. Forward flexion -Medications trialed: Omeprazole , pantoprazole  -Current medications: Pantoprazole  40 mg daily -Complications: Erosive esophagitis, peptic stricture   GERD evaluation: -Last EGD: 11/10/2023 -Barium esophagram: 02/2023: Normal motility, no hiatal hernia -Esophageal Manometry: None -pH/Impedance: N/A -Bravo: N/A - GES: 09/10/2022: Normal   Endoscopic History: - 08/2019: EGD: Benign-appearing esophageal stenosis dilated with endoscope alone, LA Grade D esophagitis - 06/04/2022: EGD: LA Grade C esophagitis, One benign-appearing, intrinsic moderate to severe stenosis was found at the GEJ, dilated with the endoscope alone.  Normal stomach and duodenum. - 08/14/2022: EGD: LA Grade C esophagitis. Possible short segment of Barrett's near the GEJ but hard to tell in light of inflammatory changes Findings: - One benign-appearing, intrinsic stenosis was found at the GEJ - passage with the endoscope dilated it slightly but lumen was more patent than  the last exam. - Mucosal changes including ringed esophagus and longitudinal furrows were found in the mid esophagus. Biopsies negative for eosinophilic esophagitis. Food (residue) was found in the gastric body prohibiting views of the stomach. Once this was noted the procedure was aborted, full exam not performed. - 10/06/2022: EGD: Z line just slightly irregular - does not meet criteria of Barrett's. Interval healing of esophagitis.  Hill grade 1 valve, benign-appearing, intrinsic mild stenosis was found 40 cm from the incisors, dilated with 16 mm and 17 mm TTS balloon after which appropriate dilation effect was seen. - The exam of the esophagus was otherwise normal. Interval healing of esophagitis on PPI. - The entire examined stomach was normal. - The examined duodenum was normal.  - 11/10/2023: EGD: Benign-appearing intrinsic mild stenosis at 40 cm dilated with 19 mm TTS balloon, normal Z-line at 41 cm, otherwise normal   Past Medical History:  Diagnosis Date   Allergy    Anxiety    Arthritis    Asthma    Depression    GERD (gastroesophageal reflux disease)    History of gallstones      Past Surgical History:  Procedure Laterality Date   APPENDECTOMY  06/15/1980   CHOLECYSTECTOMY  2017   HERNIA REPAIR N/A 2005   Umbilical   KNEE ARTHROSCOPY  06/15/2010   TONSILLECTOMY  08/28/2021   Family History  Problem Relation Age of Onset   Arthritis Mother    Hypertension Mother    Arthritis/Rheumatoid Mother    Depression Father    Heart disease Father    Hypertension Father    Psoriasis Sister    Lupus Sister    Depression Brother    Asthma Neg Hx    Alcohol  abuse Neg Hx    Cancer Neg Hx    COPD Neg Hx    Diabetes Neg Hx    Drug abuse Neg Hx    Hyperlipidemia Neg Hx    Kidney disease Neg Hx    Stroke Neg Hx    Colon cancer Neg Hx    Esophageal cancer Neg Hx    Stomach cancer Neg Hx    Rectal cancer Neg Hx    Social History   Tobacco Use   Smoking status: Former     Types: Cigarettes   Smokeless tobacco: Never  Vaping Use   Vaping status: Never Used  Substance Use Topics   Alcohol use: Yes    Alcohol/week: 3.0 standard drinks of alcohol    Types: 3 Glasses of wine per week    Comment: occ   Drug use: Not Currently   Current Facility-Administered Medications  Medication Dose Route Frequency Provider Last Rate Last Admin   0.9 %  sodium chloride  infusion   Intravenous Continuous Yariela Tison V, DO       ceFAZolin (ANCEF) IVPB 2g/100 mL premix  2 g Intravenous Once Maydell Knoebel V, DO       famotidine (PEPCID) IVPB 20 mg premix  20 mg Intravenous Once Colton Tassin V, DO 100 mL/hr at 11/23/23 0713 20 mg at 11/23/23 1610   lactated ringers infusion   Intravenous Continuous Colbe Viviano V, DO       scopolamine  (TRANSDERM-SCOP) 1 MG/3DAYS 1.5 mg  1 patch Transdermal Once Graviel Payeur V, DO   1.5 mg at 11/23/23 9604   Allergies  Allergen Reactions   Dulera [Mometasone  Furo-Formoterol  Fum] Other (See Comments)    headache   Imitrex  [Sumatriptan ] Other (See Comments)    Chest pain and numbness   Prilosec [Omeprazole ] Other (See Comments)    Headache   Shellfish Allergy Rash    GI intolerance     Review of Systems: All systems reviewed and negative except where noted in HPI.     Physical Exam:    Wt Readings from Last 3 Encounters:  11/23/23 88 kg  11/10/23 88.2 kg  03/31/23 91.3 kg    BP (!) 135/93   Temp 99 F (37.2 C) (Temporal)   Resp (!) 22   Ht 5\' 11"  (1.803 m)   Wt 88 kg   SpO2 100%   BMI 27.06 kg/m  Constitutional:  Pleasant, in no acute distress. Psychiatric: Normal mood and affect. Behavior is normal. Cardiovascular: Normal rate, regular rhythm. No edema Pulmonary/chest: Effort normal and breath sounds normal. No wheezing, rales or rhonchi. Abdominal: Soft, nondistended, nontender. Bowel sounds active throughout. There are no masses palpable. No hepatomegaly. Neurological: Alert and oriented to person  place and time. Skin: Skin is warm and dry. No rashes noted.   ASSESSMENT AND PLAN;   1) GERD with erosive esophagitis 2) Peptic stricture 3) Regurgitation 4) Heartburn  Derek Tucker is a 53 y.o. male with a long-standing history of GERD, complicated by erosive esophagitis and peptic stricture.  Reflux symptoms generally responsive to PPI therapy but requesting antireflux surgery with goal of improved/resolved symptoms, resolution of esophagitis, and stopping acid suppression medications.  - Proceed with TIF today   Kallie Depolo V Prajwal Fellner, DO, FACG  11/23/2023, 7:19 AM   No ref. provider found

## 2023-11-23 NOTE — Telephone Encounter (Signed)
-----   Message from Annis Kinder sent at 11/23/2023 10:14 AM EDT ----- Completed TIF without issue today.  Patient getting discharged home from PACU instead of overnight admission.  Would you mind just calling to check in on him tomorrow to see how he is doing postoperatively and make sure no questions regarding discharge medications, postop diet, etc.  Thank you.

## 2023-11-23 NOTE — Transfer of Care (Signed)
 Immediate Anesthesia Transfer of Care Note  Patient: Derek Tucker  Procedure(s) Performed: EGD (ESOPHAGOGASTRODUODENOSCOPY) FUNDOPLICATION, STOMACH, INCISIONLESS, ORAL APPROACH  Patient Location: PACU  Anesthesia Type:General  Level of Consciousness: awake, alert , and patient cooperative  Airway & Oxygen Therapy: Patient Spontanous Breathing and Patient connected to face mask oxygen  Post-op Assessment: Report given to RN and Post -op Vital signs reviewed and stable  Post vital signs: Reviewed and stable  Last Vitals:  Vitals Value Taken Time  BP 134/89 11/23/23 0840  Temp    Pulse 90 11/23/23 0843  Resp 21 11/23/23 0843  SpO2 100 % 11/23/23 0843  Vitals shown include unfiled device data.  Last Pain:  Vitals:   11/23/23 0651  TempSrc: Temporal  PainSc: 0-No pain      Patients Stated Pain Goal: 0 (11/23/23 1610)  Complications: No notable events documented.

## 2023-11-23 NOTE — Anesthesia Postprocedure Evaluation (Signed)
 Anesthesia Post Note  Patient: Derek Tucker  Procedure(s) Performed: EGD (ESOPHAGOGASTRODUODENOSCOPY) FUNDOPLICATION, STOMACH, INCISIONLESS, ORAL APPROACH     Patient location during evaluation: Endoscopy Anesthesia Type: MAC Level of consciousness: awake and alert Pain management: pain level controlled Vital Signs Assessment: post-procedure vital signs reviewed and stable Respiratory status: spontaneous breathing, nonlabored ventilation, respiratory function stable and patient connected to nasal cannula oxygen Cardiovascular status: blood pressure returned to baseline and stable Postop Assessment: no apparent nausea or vomiting Anesthetic complications: no  No notable events documented.  Last Vitals:  Vitals:   11/23/23 1100 11/23/23 1130  BP: 105/70 134/89  Pulse: 66   Resp: 12   Temp:    SpO2: 100% 94%    Last Pain:  Vitals:   11/23/23 1130  TempSrc:   PainSc: 0-No pain                 Rosalita Combe

## 2023-11-23 NOTE — Anesthesia Procedure Notes (Signed)
 Procedure Name: Intubation Date/Time: 11/23/2023 7:38 AM  Performed by: Alwyn Juba, CRNAPre-anesthesia Checklist: Patient identified, Emergency Drugs available, Suction available, Patient being monitored and Timeout performed Patient Re-evaluated:Patient Re-evaluated prior to induction Oxygen Delivery Method: Circle system utilized Preoxygenation: Pre-oxygenation with 100% oxygen Induction Type: IV induction, Rapid sequence and Cricoid Pressure applied Laryngoscope Size: Mac and 4 Grade View: Grade I Tube type: Oral Tube size: 7.5 mm Number of attempts: 1 Airway Equipment and Method: Stylet Placement Confirmation: ETT inserted through vocal cords under direct vision, positive ETCO2 and breath sounds checked- equal and bilateral Secured at: 22 cm Tube secured with: Tape Dental Injury: Teeth and Oropharynx as per pre-operative assessment

## 2023-11-23 NOTE — Interval H&P Note (Signed)
 History and Physical Interval Note:  11/23/2023 7:31 AM  Derek Tucker  has presented today for surgery, with the diagnosis of GERD with erosive esophagitis.  The various methods of treatment have been discussed with the patient and family. After consideration of risks, benefits and other options for treatment, the patient has consented to  Procedure(s): EGD (ESOPHAGOGASTRODUODENOSCOPY) (N/A) FUNDOPLICATION, STOMACH, INCISIONLESS, ORAL APPROACH (N/A) as a surgical intervention.  The patient's history has been reviewed, patient examined, no change in status, stable for surgery.  I have reviewed the patient's chart and labs.  Questions were answered to the patient's satisfaction.     Derek Tucker

## 2023-11-25 NOTE — Telephone Encounter (Signed)
 Called and spoke with patient to follow up after TIF. Patient states that he is doing well, he reports tolerable discomfort when swallowing. Patient states that he is tolerating liquid diet and taking meds as prescribed. Patient did not have any concerns at this time. Patient has been advised that symptoms of discomfort will improve with time but he needs to let us  know if pain worsens or becomes severe. Patient verbalized understanding and had no concerns at the end of the call.

## 2023-11-26 ENCOUNTER — Telehealth: Payer: Self-pay | Admitting: Gastroenterology

## 2023-11-26 ENCOUNTER — Encounter (HOSPITAL_COMMUNITY): Payer: Self-pay | Admitting: Gastroenterology

## 2023-11-26 MED ORDER — LIDOCAINE VISCOUS HCL 2 % MT SOLN: 15.0000 mL | Freq: Four times a day (QID) | OROMUCOSAL | 0 refills | Status: DC | PRN

## 2023-11-26 MED ORDER — OXYCODONE-ACETAMINOPHEN 7.5-325 MG PO TABS: 1.0000 | ORAL_TABLET | Freq: Four times a day (QID) | ORAL | 0 refills | Status: AC | PRN

## 2023-11-26 NOTE — Telephone Encounter (Signed)
 Yes, the postop pain can be a little slow to resolve even though this was an incision less procedure.  Description of the pain does seem, however, on par with expected after this procedure.  I placed an order for a refill of the Percocet (12 tablets) along with the viscous lidocaine .  Can also use Tylenol  for mild or even moderate postoperative pain.  Can you please call the patient to let him know those medications have been sent in.  Thanks.

## 2023-11-26 NOTE — Telephone Encounter (Signed)
 Called and spoke with patient. We reviewed note from Dr. Karene Oto, patient is aware that prescriptions for Percocet and viscous lidocaine  have been called into his pharmacy. Patient verbalized understanding and had no additional concerns at this time.

## 2023-12-09 ENCOUNTER — Ambulatory Visit: Admitting: Gastroenterology

## 2023-12-12 ENCOUNTER — Other Ambulatory Visit: Payer: Self-pay | Admitting: Internal Medicine

## 2023-12-12 DIAGNOSIS — J4530 Mild persistent asthma, uncomplicated: Secondary | ICD-10-CM

## 2023-12-13 ENCOUNTER — Ambulatory Visit (INDEPENDENT_AMBULATORY_CARE_PROVIDER_SITE_OTHER): Admitting: Internal Medicine

## 2023-12-13 VITALS — BP 122/78 | HR 81 | Temp 97.9°F | Resp 16 | Ht 71.0 in | Wt 195.4 lb

## 2023-12-13 DIAGNOSIS — E785 Hyperlipidemia, unspecified: Secondary | ICD-10-CM | POA: Diagnosis not present

## 2023-12-13 DIAGNOSIS — E538 Deficiency of other specified B group vitamins: Secondary | ICD-10-CM | POA: Diagnosis not present

## 2023-12-13 DIAGNOSIS — T781XXD Other adverse food reactions, not elsewhere classified, subsequent encounter: Secondary | ICD-10-CM | POA: Insufficient documentation

## 2023-12-13 DIAGNOSIS — J3081 Allergic rhinitis due to animal (cat) (dog) hair and dander: Secondary | ICD-10-CM | POA: Insufficient documentation

## 2023-12-13 DIAGNOSIS — Z0000 Encounter for general adult medical examination without abnormal findings: Secondary | ICD-10-CM

## 2023-12-13 DIAGNOSIS — G63 Polyneuropathy in diseases classified elsewhere: Secondary | ICD-10-CM | POA: Diagnosis not present

## 2023-12-13 DIAGNOSIS — Z23 Encounter for immunization: Secondary | ICD-10-CM | POA: Diagnosis not present

## 2023-12-13 DIAGNOSIS — H1045 Other chronic allergic conjunctivitis: Secondary | ICD-10-CM | POA: Insufficient documentation

## 2023-12-13 NOTE — Patient Instructions (Signed)
 Health Maintenance, Male  Adopting a healthy lifestyle and getting preventive care are important in promoting health and wellness. Ask your health care provider about:  The right schedule for you to have regular tests and exams.  Things you can do on your own to prevent diseases and keep yourself healthy.  What should I know about diet, weight, and exercise?  Eat a healthy diet    Eat a diet that includes plenty of vegetables, fruits, low-fat dairy products, and lean protein.  Do not eat a lot of foods that are high in solid fats, added sugars, or sodium.  Maintain a healthy weight  Body mass index (BMI) is a measurement that can be used to identify possible weight problems. It estimates body fat based on height and weight. Your health care provider can help determine your BMI and help you achieve or maintain a healthy weight.  Get regular exercise  Get regular exercise. This is one of the most important things you can do for your health. Most adults should:  Exercise for at least 150 minutes each week. The exercise should increase your heart rate and make you sweat (moderate-intensity exercise).  Do strengthening exercises at least twice a week. This is in addition to the moderate-intensity exercise.  Spend less time sitting. Even light physical activity can be beneficial.  Watch cholesterol and blood lipids  Have your blood tested for lipids and cholesterol at 55 years of age, then have this test every 5 years.  You may need to have your cholesterol levels checked more often if:  Your lipid or cholesterol levels are high.  You are older than 55 years of age.  You are at high risk for heart disease.  What should I know about cancer screening?  Many types of cancers can be detected early and may often be prevented. Depending on your health history and family history, you may need to have cancer screening at various ages. This may include screening for:  Colorectal cancer.  Prostate cancer.  Skin cancer.  Lung  cancer.  What should I know about heart disease, diabetes, and high blood pressure?  Blood pressure and heart disease  High blood pressure causes heart disease and increases the risk of stroke. This is more likely to develop in people who have high blood pressure readings or are overweight.  Talk with your health care provider about your target blood pressure readings.  Have your blood pressure checked:  Every 3-5 years if you are 55-95 years of age.  Every year if you are 55 years old or older.  If you are between the ages of 29 and 29 and are a current or former smoker, ask your health care provider if you should have a one-time screening for abdominal aortic aneurysm (AAA).  Diabetes  Have regular diabetes screenings. This checks your fasting blood sugar level. Have the screening done:  Once every three years after age 55 if you are at a normal weight and have a low risk for diabetes.  More often and at a younger age if you are overweight or have a high risk for diabetes.  What should I know about preventing infection?  Hepatitis B  If you have a higher risk for hepatitis B, you should be screened for this virus. Talk with your health care provider to find out if you are at risk for hepatitis B infection.  Hepatitis C  Blood testing is recommended for:  Everyone born from [birth year] through 1967.  Anyone  with known risk factors for hepatitis C.  Sexually transmitted infections (STIs)  You should be screened each year for STIs, including gonorrhea and chlamydia, if:  You are sexually active and are younger than 55 years of age.  You are older than 55 years of age and your health care provider tells you that you are at risk for this type of infection.  Your sexual activity has changed since you were last screened, and you are at increased risk for chlamydia or gonorrhea. Ask your health care provider if you are at risk.  Ask your health care provider about whether you are at high risk for HIV. Your health care provider  may recommend a prescription medicine to help prevent HIV infection. If you choose to take medicine to prevent HIV, you should first get tested for HIV. You should then be tested every 3 months for as long as you are taking the medicine.  Follow these instructions at home:  Alcohol use  Do not drink alcohol if your health care provider tells you not to drink.  If you drink alcohol:  Limit how much you have to 0-2 drinks a day.  Know how much alcohol is in your drink. In the [place] one drink equals one 12 oz bottle of beer (355 mL), one 5 oz glass of wine (148 mL), or one 1 oz glass of hard liquor (44 mL).  Lifestyle  Do not use any products that contain nicotine or tobacco. These products include cigarettes, chewing tobacco, and vaping devices, such as e-cigarettes. If you need help quitting, ask your health care provider.  Do not use street drugs.  Do not share needles.  Ask your health care provider for help if you need support or information about quitting drugs.  General instructions  Schedule regular health, dental, and eye exams.  Stay current with your vaccines.  Tell your health care provider if:  You often feel depressed.  You have ever been abused or do not feel safe at home.  Summary  Adopting a healthy lifestyle and getting preventive care are important in promoting health and wellness.  Follow your health care provider's instructions about healthy diet, exercising, and getting tested or screened for diseases.  Follow your health care provider's instructions on monitoring your cholesterol and blood pressure.  This information is not intended to replace advice given to you by your health care provider. Make sure you discuss any questions you have with your health care provider.  Document Revised: 10/21/2020 Document Reviewed: 10/21/2020  Elsevier Patient Education  2024 [employer].

## 2023-12-13 NOTE — Progress Notes (Unsigned)
 Subjective:  Patient ID: Derek Tucker, male    DOB: [birth year]  Age: 53 y.o. MRN: [id number]  CC: Annual Exam and Gastroesophageal Reflux   HPI Derek Tucker presents for a CPX and f/up ----  Discussed the use of AI scribe software for clinical note transcription with the patient, who gave verbal consent to proceed.  History of Present Illness   Derek Tucker is a 53 year old male who presents for follow-up after a TIF procedure.  Three weeks ago, he underwent a TIF procedure. Initially, he experienced significant throat soreness due to the size of the endoscopic device used during the procedure, which lasted for three to four days. He was prescribed a lidocaine  drink to manage the pain. Post-procedure, he was on a liquid diet consisting of chicken broth and Jell-O. Swallowing difficulties were temporary and related to the procedure. Currently, he is able to eat more normally but avoids high-fiber foods like salads and grilled chicken. He was prescribed Zofran  for nausea post-procedure but has not needed it in weeks. He is still taking pantoprazole .  He is currently taking Mirapex , which was prescribed off-label for depression, and it has made a significant positive impact on his mood. He also takes B12 injections but had a lapse in administration for four to five months due to a prescription issue. He recently received a new supply but has not yet administered it. He feels more tired and experienced some numbness in his right hand, which he attributes to overuse from a home project. He has a history of cervical radiculopathy and a previous laminectomy.  He is engaged in physical activities such as gardening and household projects but does not exercise regularly. He reports stable weight in the 190s for the past decade and no significant changes in appetite. He occasionally takes magnesium  and amylase supplements.  No current trouble swallowing, nausea, vomiting,  chest pain, or shortness of breath, except for asthma. He experienced numbness in his right hand, which he attributes to overuse.       Outpatient Medications Prior to Visit  Medication Sig Dispense Refill   cyanocobalamin  (VITAMIN B12) 1000 MCG/ML injection INJECT 1 ML (1000 MCG TOTAL) INTRAMUSCULARLY ONCE EVERY 30 DAYS 1 mL 0   fish oil-omega-3 fatty acids 1000 MG capsule Take 2 g by mouth daily.     fluticasone  (FLONASE ) 50 MCG/ACT nasal spray Place 4 sprays into the nose daily. 16 g 11   pantoprazole  (PROTONIX ) 40 MG tablet Take 1 tablet (40 mg total) by mouth 2 (two) times daily. Take 1 tablet by mouth twice daily for 2 weeks, then reduce to 1 tablet daily for 2 weeks, then reduce to 20 mg daily for 1 week, then as needed only 90 tablet 0   pramipexole  (MIRAPEX ) 0.25 MG tablet Take 0.25 mg by mouth daily.     pramipexole  (MIRAPEX ) 1 MG tablet TAKE 1 TABLET BY MOUTH AT BEDTIME 90 tablet 1   Spacer/Aero Chamber Mouthpiece MISC 1 Act by Does not apply route 2 (two) times daily. 1 each 11   SPRAVATO, 84 MG DOSE, 28 MG/DEVICE SOPK Place 1 spray into both nostrils once a week.     SYRINGE-NEEDLE, DISP, 3 ML (B-D SYRINGE/NEEDLE 3CC/23GX1) 23G X 1 3 ML MISC Use to inject B12 injection monthly. 50 each 0   UBRELVY  100 MG TABS TAKE 1 TABLET BY MOUTH ONCE DAILY AS NEEDED 16 tablet 0   albuterol  (VENTOLIN  HFA) 108 (90 Base) MCG/ACT inhaler INHALE 2  PUFFS BY MOUTH EVERY 6 HOURS AS NEEDED FOR WHEEZING OR  SHORTNESS  OF  BREATH. 18 g 0   ondansetron  (ZOFRAN ) 4 MG tablet Take 1 tablet (4 mg total) by mouth daily as needed for nausea or vomiting. 30 tablet 1   ondansetron  (ZOFRAN -ODT) 4 MG disintegrating tablet Take 1 tablet (4 mg total) by mouth every 8 (eight) hours as needed. 30 tablet 1   EPINEPHrine 0.3 mg/0.3 mL IJ SOAJ injection  (Patient not taking: Reported on 12/13/2023)     lidocaine  (XYLOCAINE ) 2 % solution Use as directed 15 mLs in the mouth or throat every 6 (six) hours as needed (Odynophagia,  esophageal pain). Drink every 6 hours as needed for esophageal pain.  Do not swish. 420 mL 0   metoCLOPramide  (REGLAN ) 5 MG tablet Take 1 tablet (5 mg total) by mouth every 8 (eight) hours as needed for nausea or vomiting. 30 tablet 0   No facility-administered medications prior to visit.    ROS Review of Systems  Objective:  BP 122/78 (BP Location: Left Arm, Patient Position: Sitting, Cuff Size: Normal)   Pulse 81   Temp 97.9 F (36.6 C) (Oral)   Resp 16   Ht 5' 11 (1.803 m)   Wt 195 lb 6.4 oz (88.6 kg)   SpO2 97%   BMI 27.25 kg/m   BP Readings from Last 3 Encounters:  12/13/23 122/78  11/23/23 134/89  11/10/23 115/71    Wt Readings from Last 3 Encounters:  12/13/23 195 lb 6.4 oz (88.6 kg)  11/23/23 194 lb (88 kg)  11/10/23 194 lb 6.4 oz (88.2 kg)    Physical Exam  Lab Results  Component Value Date   WBC 6.6 03/19/2023   HGB 14.8 03/19/2023   HCT 44.3 03/19/2023   PLT 314.0 03/19/2023   GLUCOSE 106 (H) 03/19/2023   CHOL 121 03/19/2023   TRIG 136.0 03/19/2023   HDL 39.90 03/19/2023   LDLDIRECT 71.0 02/14/2021   LDLCALC 54 03/19/2023   ALT 34 03/19/2023   AST 33 03/19/2023   NA 137 03/19/2023   K 3.9 03/19/2023   CL 102 03/19/2023   CREATININE 0.98 03/19/2023   BUN 8 03/19/2023   CO2 26 03/19/2023   TSH 2.24 03/19/2023   PSA 0.24 04/23/2022    No results found.  Assessment & Plan:  Vitamin B12 deficiency neuropathy (HCC) -     CBC with Differential/Platelet; Future -     Vitamin B12; Future  Routine general medical examination at a health care facility -     PSA; Future  Immunization due -     Tdap vaccine greater than or equal to 7yo IM     Follow-up: Return in about 6 months (around 06/13/2024).  Debby Molt, MD

## 2023-12-16 ENCOUNTER — Other Ambulatory Visit (INDEPENDENT_AMBULATORY_CARE_PROVIDER_SITE_OTHER)

## 2023-12-16 ENCOUNTER — Encounter: Payer: Self-pay | Admitting: Gastroenterology

## 2023-12-16 ENCOUNTER — Ambulatory Visit: Admitting: Gastroenterology

## 2023-12-16 ENCOUNTER — Ambulatory Visit: Payer: Self-pay | Admitting: Internal Medicine

## 2023-12-16 VITALS — BP 130/88 | HR 84 | Ht 70.0 in | Wt 199.1 lb

## 2023-12-16 DIAGNOSIS — Z0000 Encounter for general adult medical examination without abnormal findings: Secondary | ICD-10-CM | POA: Diagnosis not present

## 2023-12-16 DIAGNOSIS — E538 Deficiency of other specified B group vitamins: Secondary | ICD-10-CM | POA: Diagnosis not present

## 2023-12-16 DIAGNOSIS — G63 Polyneuropathy in diseases classified elsewhere: Secondary | ICD-10-CM

## 2023-12-16 DIAGNOSIS — K2100 Gastro-esophageal reflux disease with esophagitis, without bleeding: Secondary | ICD-10-CM

## 2023-12-16 DIAGNOSIS — K222 Esophageal obstruction: Secondary | ICD-10-CM

## 2023-12-16 LAB — PSA: PSA: 0.34 ng/mL (ref 0.10–4.00)

## 2023-12-16 LAB — CBC WITH DIFFERENTIAL/PLATELET
Basophils Absolute: 0.1 10*3/uL (ref 0.0–0.1)
Basophils Relative: 1.2 % (ref 0.0–3.0)
Eosinophils Absolute: 0.4 10*3/uL (ref 0.0–0.7)
Eosinophils Relative: 6.7 % — ABNORMAL HIGH (ref 0.0–5.0)
HCT: 42.6 % (ref 39.0–52.0)
Hemoglobin: 14.6 g/dL (ref 13.0–17.0)
Lymphocytes Relative: 34.1 % (ref 12.0–46.0)
Lymphs Abs: 2.2 10*3/uL (ref 0.7–4.0)
MCHC: 34.3 g/dL (ref 30.0–36.0)
MCV: 86.5 fl (ref 78.0–100.0)
Monocytes Absolute: 0.7 10*3/uL (ref 0.1–1.0)
Monocytes Relative: 11.5 % (ref 3.0–12.0)
Neutro Abs: 2.9 10*3/uL (ref 1.4–7.7)
Neutrophils Relative %: 46.5 % (ref 43.0–77.0)
Platelets: 299 10*3/uL (ref 150.0–400.0)
RBC: 4.93 Mil/uL (ref 4.22–5.81)
RDW: 13.7 % (ref 11.5–15.5)
WBC: 6.3 10*3/uL (ref 4.0–10.5)

## 2023-12-16 LAB — VITAMIN B12: Vitamin B-12: 362 pg/mL (ref 211–911)

## 2023-12-16 NOTE — Patient Instructions (Signed)
 _______________________________________________________  If your blood pressure at your visit was 140/90 or greater, please contact your primary care physician to follow up on this.  _______________________________________________________  If you are age 55 or older, your body mass index should be between 23-30. Your Body mass index is 28.57 kg/m. If this is out of the aforementioned range listed, please consider follow up with your Primary Care Provider.  If you are age 65 or younger, your body mass index should be between 19-25. Your Body mass index is 28.57 kg/m. If this is out of the aformentioned range listed, please consider follow up with your Primary Care Provider.   ________________________________________________________  The [facility] providers would like to encourage you to use MYCHART to communicate with providers for non-urgent requests or questions.  Due to long hold times on the telephone, sending your provider a message by [facility] may be a faster and more efficient way to get a response.  Please allow 48 business hours for a response.  Please remember that this is for non-urgent requests.  _______________________________________________________  It was a pleasure to see you today!  Vito Cirigliano, D.O.

## 2023-12-16 NOTE — Progress Notes (Signed)
 Chief Complaint:    Postoperative follow-up  GI History: 53 year old male with medical history as outlined below, follows in the GI clinic for longstanding history of GERD c/b erosive esophagitis and peptic stricture.  Reflux sxs for 15+ years. Has had repeat dilations of peptic stricture over the years. Had headaches with omeprazole  so this was stopped.  No issue tolerating pantoprazole .   Has tried [facility] elevation and significant dietary mods in the past without clinical or endoscopic improvement.   Will still have regurgitation. Worse with forward flexion and post prandial. No longer having dysphagia after most recent EGD with dilation.    GERD history: -Index symptoms: Regurgitation, heartburn -Exacerbating features: Tomato based sauces, coffee, overeating bread, supine. Forward flexion -Medications trialed: Omeprazole , pantoprazole  -Current medications: Pantoprazole  40 mg daily -Complications: Erosive esophagitis, peptic stricture   GERD evaluation: -Last EGD: 11/10/2023 -Barium esophagram: 02/2023: Normal motility, no hiatal hernia -Esophageal Manometry: None -pH/Impedance: N/A -Bravo: N/A - GES: 09/10/2022: Normal   Endoscopic History: - 08/2019: EGD: Benign-appearing esophageal stenosis dilated with endoscope alone, LA Grade D esophagitis - 06/04/2022: EGD: LA Grade C esophagitis, One benign-appearing, intrinsic moderate to severe stenosis was found at the GEJ, dilated with the endoscope alone.  Normal stomach and duodenum. - 08/14/2022: EGD: LA Grade C esophagitis. Possible short segment of Barrett's near the GEJ but hard to tell in light of inflammatory changes Findings: - One benign-appearing, intrinsic stenosis was found at the GEJ - passage with the endoscope dilated it slightly but lumen was more patent than the last exam. - Mucosal changes including ringed esophagus and longitudinal furrows were found in the mid esophagus. Biopsies negative for eosinophilic esophagitis. Food  (residue) was found in the gastric body prohibiting views of the stomach. Once this was noted the procedure was aborted, full exam not performed. - 10/06/2022: EGD: Z line just slightly irregular - does not meet criteria of Barrett's. Interval healing of esophagitis.  Hill grade 1 valve, benign-appearing, intrinsic mild stenosis was found 40 cm from the incisors, dilated with 16 mm and 17 mm TTS balloon after which appropriate dilation effect was seen. - The exam of the esophagus was otherwise normal. Interval healing of esophagitis on PPI. - The entire examined stomach was normal. - The examined duodenum was normal.  - 11/10/2023: EGD: Benign-appearing intrinsic mild stenosis at 40 cm dilated with 19 mm TTS balloon, normal Z-line at 41 cm, otherwise normal - 11/23/2023: Transoral Incisionless Fundoplication    HPI:     Patient is a 52 y.o. male presenting to the Gastroenterology Clinic for postoperative follow-up after TIF on 11/23/2023.  States he is feeling great.  Recovering well after surgery.  Slowly advancing his diet as tolerated per postoperative protocol.  Currently taking Protonix  40 mg daily.  Has not had any breakthrough reflux symptoms since surgery.  No new labs or abdominal imaging for review today.  Review of systems:     No chest pain, no SOB, no fevers, no urinary sx   Past Medical History:  Diagnosis Date   Allergy    Anxiety    Arthritis    Asthma    Depression    GERD (gastroesophageal reflux disease)    History of gallstones     Patient's surgical history, family medical history, social history, medications and allergies were all reviewed in Epic    Current Outpatient Medications  Medication Sig Dispense Refill   albuterol  (VENTOLIN  HFA) 108 (90 Base) MCG/ACT inhaler INHALE 2 PUFFS BY MOUTH EVERY 6 HOURS  AS NEEDED FOR WHEEZING OR SHORTNESS OF BREATH 18 g 0   cyanocobalamin  (VITAMIN B12) 1000 MCG/ML injection INJECT 1 ML (1000 MCG TOTAL) INTRAMUSCULARLY ONCE EVERY 30  DAYS 1 mL 0   EPINEPHrine 0.3 mg/0.3 mL IJ SOAJ injection      fish oil-omega-3 fatty acids 1000 MG capsule Take 2 g by mouth daily.     fluticasone  (FLONASE ) 50 MCG/ACT nasal spray Place 4 sprays into the nose daily. 16 g 11   pantoprazole  (PROTONIX ) 40 MG tablet Take 1 tablet (40 mg total) by mouth 2 (two) times daily. Take 1 tablet by mouth twice daily for 2 weeks, then reduce to 1 tablet daily for 2 weeks, then reduce to 20 mg daily for 1 week, then as needed only 90 tablet 0   pramipexole  (MIRAPEX ) 0.25 MG tablet Take 0.25 mg by mouth daily.     pramipexole  (MIRAPEX ) 1 MG tablet TAKE 1 TABLET BY MOUTH AT BEDTIME 90 tablet 1   Spacer/Aero Chamber Mouthpiece MISC 1 Act by Does not apply route 2 (two) times daily. 1 each 11   SPRAVATO, 84 MG DOSE, 28 MG/DEVICE SOPK Place 1 spray into both nostrils once a week.     SYRINGE-NEEDLE, DISP, 3 ML (B-D SYRINGE/NEEDLE 3CC/23GX1) 23G X 1 3 ML MISC Use to inject B12 injection monthly. 50 each 0   UBRELVY  100 MG TABS TAKE 1 TABLET BY MOUTH ONCE DAILY AS NEEDED 16 tablet 0   No current facility-administered medications for this visit.    Physical Exam:     BP 130/88 (BP Location: Left Arm, Patient Position: Sitting, Cuff Size: Normal)   Pulse 84   Ht 5' 10 (1.778 m)   Wt 199 lb 2 oz (90.3 kg)   BMI 28.57 kg/m   GENERAL:  Pleasant male in NAD PSYCH: : Cooperative, normal affect NEURO: Alert and oriented x 3, no focal neurologic deficits   IMPRESSION and PLAN:    1) GERD with erosive esophagitis 2) Peptic stricture Doing well after TIF last month.  No breakthrough reflux symptoms and titrating his PPI down.  Tolerating postoperative diet without issue. - Continue slowly advancing diet as tolerated and per postoperative protocol - Continue Protonix  40 mg daily this week, then reduce to 20 mg daily x 7 days, then every other day x 7 days, then discontinue - Continue advancing exercise/activity as tolerated and per postoperative  protocol   RTC in 6 months or sooner as needed           Sandor LULLA Flatter ,DO, FACG 12/16/2023, 11:17 AM

## 2023-12-20 ENCOUNTER — Other Ambulatory Visit: Payer: Self-pay | Admitting: Internal Medicine

## 2023-12-20 DIAGNOSIS — G43009 Migraine without aura, not intractable, without status migrainosus: Secondary | ICD-10-CM

## 2024-01-22 ENCOUNTER — Other Ambulatory Visit: Payer: Self-pay | Admitting: Internal Medicine

## 2024-01-22 DIAGNOSIS — G43009 Migraine without aura, not intractable, without status migrainosus: Secondary | ICD-10-CM

## 2024-02-11 ENCOUNTER — Other Ambulatory Visit: Payer: Self-pay | Admitting: Internal Medicine

## 2024-02-11 DIAGNOSIS — G63 Polyneuropathy in diseases classified elsewhere: Secondary | ICD-10-CM

## 2024-02-11 DIAGNOSIS — J4530 Mild persistent asthma, uncomplicated: Secondary | ICD-10-CM

## 2024-03-09 ENCOUNTER — Other Ambulatory Visit: Payer: Self-pay | Admitting: Medical Genetics

## 2024-04-14 ENCOUNTER — Other Ambulatory Visit: Payer: Self-pay | Admitting: Internal Medicine

## 2024-04-14 DIAGNOSIS — J4530 Mild persistent asthma, uncomplicated: Secondary | ICD-10-CM

## 2024-04-14 DIAGNOSIS — E538 Deficiency of other specified B group vitamins: Secondary | ICD-10-CM

## 2024-04-14 DIAGNOSIS — G43009 Migraine without aura, not intractable, without status migrainosus: Secondary | ICD-10-CM

## 2024-04-25 ENCOUNTER — Other Ambulatory Visit: Payer: Self-pay

## 2024-04-25 ENCOUNTER — Emergency Department (HOSPITAL_BASED_OUTPATIENT_CLINIC_OR_DEPARTMENT_OTHER)
Admission: EM | Admit: 2024-04-25 | Discharge: 2024-04-25 | Disposition: A | Discharge status: Home / Self Care | Attending: Emergency Medicine | Admitting: Emergency Medicine

## 2024-04-25 ENCOUNTER — Emergency Department (HOSPITAL_BASED_OUTPATIENT_CLINIC_OR_DEPARTMENT_OTHER)

## 2024-04-25 DIAGNOSIS — W010XXA Fall on same level from slipping, tripping and stumbling without subsequent striking against object, initial encounter: Secondary | ICD-10-CM | POA: Diagnosis not present

## 2024-04-25 DIAGNOSIS — S01512A Laceration without foreign body of oral cavity, initial encounter: Secondary | ICD-10-CM | POA: Diagnosis not present

## 2024-04-25 DIAGNOSIS — S022XXA Fracture of nasal bones, initial encounter for closed fracture: Secondary | ICD-10-CM | POA: Insufficient documentation

## 2024-04-25 DIAGNOSIS — S0993XA Unspecified injury of face, initial encounter: Secondary | ICD-10-CM | POA: Diagnosis present

## 2024-04-25 DIAGNOSIS — R55 Syncope and collapse: Secondary | ICD-10-CM | POA: Insufficient documentation

## 2024-04-25 MED ORDER — ONDANSETRON 4 MG PO TBDP
ORAL_TABLET | ORAL | 0 refills | Status: AC
Start: 2024-04-25 — End: ?

## 2024-04-25 MED ORDER — AMOXICILLIN 500 MG PO CAPS: 500.0000 mg | ORAL_CAPSULE | Freq: Three times a day (TID) | ORAL | 0 refills | Status: AC

## 2024-04-25 NOTE — ED Triage Notes (Signed)
 POV syncopal episode while using br. Loss conciseness and hit face on door. Laceration to bridge of nose. Bleeding controlled in triage. Reports being down for 2 minutes before being found. Has hx of syncopal events. Has been seen by cardiology. Denies blood thinners.

## 2024-04-25 NOTE — ED Provider Notes (Signed)
 [place] EMERGENCY DEPARTMENT AT [facility] Provider Note   CSN: [id number] Arrival date & time: 04/25/24  0413     Patient presents with: Loss of Consciousness   Derek Tucker is a 53 y.o. male.   Presents with facial injuries after syncope. Reports at least 40 vaso-vagal syncope episodes in the past, this was similar. Was having a bowel movement, felt like he was going to pass out but didn't get to the floor fast enough. Clemens forward and hit face on the wall. Complains of pain and swelling of nose and inside upper lip.       Prior to Admission medications   Medication Sig Start Date End Date Taking? Authorizing Provider  amoxicillin  (AMOXIL ) 500 MG capsule Take 1 capsule (500 mg total) by mouth 3 (three) times daily. 04/25/24  Yes Haze Lonni PARAS, MD  ondansetron  (ZOFRAN -ODT) 4 MG disintegrating tablet 4mg  ODT q4 hours prn nausea/vomit 04/25/24  Yes Karam Dunson, Lonni PARAS, MD  albuterol  (VENTOLIN  HFA) 108 (90 Base) MCG/ACT inhaler INHALE 2 PUFFS BY MOUTH EVERY 6 HOURS AS NEEDED FOR WHEEZING OR  SHORTNESS  OF  BREATH. 04/14/24   Joshua Debby CROME, MD  cyanocobalamin  (VITAMIN B12) 1000 MCG/ML injection INJECT 1 ML (1000 MCG TOTAL) INTRAMUSCULARLY ONCE EVERY 30 DAYS 04/14/24   Joshua Debby CROME, MD  EPINEPHrine 0.3 mg/0.3 mL IJ SOAJ injection  10/06/21   [provider]  fish oil-omega-3 fatty acids 1000 MG capsule Take 2 g by mouth daily.    [provider]  fluticasone  (FLONASE ) 50 MCG/ACT nasal spray Place 4 sprays into the nose daily. 04/06/13   Joshua Debby CROME, MD  pantoprazole  (PROTONIX ) 40 MG tablet Take 1 tablet (40 mg total) by mouth 2 (two) times daily. Take 1 tablet by mouth twice daily for 2 weeks, then reduce to 1 tablet daily for 2 weeks, then reduce to 20 mg daily for 1 week, then as needed only 11/23/23   Cirigliano, Vito V, DO  pramipexole  (MIRAPEX ) 0.25 MG tablet Take 0.25 mg by mouth daily. 03/10/23   [provider]  pramipexole   (MIRAPEX ) 1 MG tablet TAKE 1 TABLET BY MOUTH AT BEDTIME 06/13/22   Joshua Debby CROME, MD  Spacer/Aero Chamber Mouthpiece MISC 1 Act by Does not apply route 2 (two) times daily. 04/06/13   Joshua Debby CROME, MD  SPRAVATO, 84 MG DOSE, 28 MG/DEVICE SOPK Place 1 spray into both nostrils once a week. 11/10/22   [provider]  SYRINGE-NEEDLE, DISP, 3 ML (B-D SYRINGE/NEEDLE 3CC/23GX1) 23G X 1 3 ML MISC Use to inject B12 injection monthly. 03/16/23   Joshua Debby CROME, MD  UBRELVY  100 MG TABS TAKE 1 TABLET BY MOUTH ONCE DAILY AS NEEDED 04/14/24   Joshua Debby CROME, MD    Allergies: Dulera [mometasone  furo-formoterol  fum], Imitrex  [sumatriptan ], Codeine, Prilosec [omeprazole ], and Shellfish allergy    Review of Systems  Updated Vital Signs BP (!) 145/105 (BP Location: Right Arm)   Pulse 83   Temp 97.7 F (36.5 C) (Oral)   Ht 5' 11 (1.803 m)   Wt 86.2 kg   SpO2 100%   BMI 26.50 kg/m   Physical Exam Vitals and nursing note reviewed.  Constitutional:      General: He is not in acute distress.    Appearance: He is well-developed.  HENT:     Head: Normocephalic and atraumatic.     Nose: Signs of injury and nasal tenderness present.     Right Nostril: No epistaxis or  septal hematoma.     Left Nostril: No epistaxis or septal hematoma.      Mouth/Throat:     Mouth: Mucous membranes are moist. Lacerations present.   Eyes:     General: Vision grossly intact. Gaze aligned appropriately.     Extraocular Movements: Extraocular movements intact.     Conjunctiva/sclera: Conjunctivae normal.  Cardiovascular:     Rate and Rhythm: Normal rate and regular rhythm.     Pulses: Normal pulses.     Heart sounds: Normal heart sounds, S1 normal and S2 normal. No murmur heard.    No friction rub. No gallop.  Pulmonary:     Effort: Pulmonary effort is normal. No respiratory distress.     Breath sounds: Normal breath sounds.  Abdominal:     Palpations: Abdomen is soft.     Tenderness: There is no  abdominal tenderness. There is no guarding or rebound.     Hernia: No hernia is present.  Musculoskeletal:        General: No swelling.     Cervical back: Full passive range of motion without pain, normal range of motion and neck supple. No pain with movement, spinous process tenderness or muscular tenderness. Normal range of motion.     Right lower leg: No edema.     Left lower leg: No edema.  Skin:    General: Skin is warm and dry.     Capillary Refill: Capillary refill takes less than 2 seconds.     Findings: No ecchymosis, erythema, lesion or wound.  Neurological:     Mental Status: He is alert and oriented to person, place, and time.     GCS: GCS eye subscore is 4. GCS verbal subscore is 5. GCS motor subscore is 6.     Cranial Nerves: Cranial nerves 2-12 are intact.     Sensory: Sensation is intact.     Motor: Motor function is intact. No weakness or abnormal muscle tone.     Coordination: Coordination is intact.  Psychiatric:        Mood and Affect: Mood normal.        Speech: Speech normal.        Behavior: Behavior normal.     (all labs ordered are listed, but only abnormal results are displayed) Labs Reviewed - No data to display  EKG: EKG Interpretation Date/Time:  Tuesday April 25 2024 05:22:17 EST Ventricular Rate:  77 PR Interval:  177 QRS Duration:  100 QT Interval:  388 QTC Calculation: 440 R Axis:   35  Text Interpretation: Sinus rhythm Abnormal R-wave progression, early transition No previous tracing Confirmed by Haze Lonni PARAS [phone]) on 04/25/2024 5:34:45 AM  Radiology: CT HEAD WO CONTRAST ( ) Result Date: 04/25/2024 CLINICAL DATA:  Loss of consciousness. Hit face on door. Laceration to nose. EXAM: CT HEAD WITHOUT CONTRAST CT MAXILLOFACIAL WITHOUT CONTRAST TECHNIQUE: Multidetector CT imaging of the head and maxillofacial structures were performed using the standard protocol without intravenous contrast. Multiplanar CT image reconstructions of  the maxillofacial structures were also generated. RADIATION DOSE REDUCTION: This exam was performed according to the departmental dose-optimization program which includes automated exposure control, adjustment of the mA and/or kV according to patient size and/or use of iterative reconstruction technique. COMPARISON:  Maxillofacial CT 12/19/2003 FINDINGS: CT HEAD FINDINGS Brain: There is no evidence for acute hemorrhage, hydrocephalus, mass lesion, or abnormal extra-axial fluid collection. No definite CT evidence for acute infarction. Vascular: No hyperdense vessel or unexpected calcification. Skull: No evidence for fracture.  No worrisome lytic or sclerotic lesion. Other: None. CT MAXILLOFACIAL FINDINGS Osseous: Minimally displaced bilateral nasal bone fracture evident. Fixation hardware noted anterior wall maxillary sinus bilaterally with surgical changes noted in the mandible. Zygomatic arches are intact. Temporomandibular joints are located. Orbits: Negative. No traumatic or inflammatory finding. Sinuses: Chronic mucosal disease noted in the right ethmoid air cells. Remaining paranasal sinuses and mastoid air cells are clear. Soft tissues: Negative. IMPRESSION: 1. No acute intracranial abnormality. 2. Minimally displaced bilateral nasal bone fracture. 3. Chronic mucosal disease in the right ethmoid air cells. 4. Postsurgical changes in the anterior walls of the maxillary sinuses and mandible. Electronically Signed   By: Camellia Candle M.D.   On: 04/25/2024 05:34   CT MAXILLOFACIAL WO CONTRAST Result Date: 04/25/2024 CLINICAL DATA:  Loss of consciousness. Hit face on door. Laceration to nose. EXAM: CT HEAD WITHOUT CONTRAST CT MAXILLOFACIAL WITHOUT CONTRAST TECHNIQUE: Multidetector CT imaging of the head and maxillofacial structures were performed using the standard protocol without intravenous contrast. Multiplanar CT image reconstructions of the maxillofacial structures were also generated. RADIATION DOSE  REDUCTION: This exam was performed according to the departmental dose-optimization program which includes automated exposure control, adjustment of the mA and/or kV according to patient size and/or use of iterative reconstruction technique. COMPARISON:  Maxillofacial CT 12/19/2003 FINDINGS: CT HEAD FINDINGS Brain: There is no evidence for acute hemorrhage, hydrocephalus, mass lesion, or abnormal extra-axial fluid collection. No definite CT evidence for acute infarction. Vascular: No hyperdense vessel or unexpected calcification. Skull: No evidence for fracture. No worrisome lytic or sclerotic lesion. Other: None. CT MAXILLOFACIAL FINDINGS Osseous: Minimally displaced bilateral nasal bone fracture evident. Fixation hardware noted anterior wall maxillary sinus bilaterally with surgical changes noted in the mandible. Zygomatic arches are intact. Temporomandibular joints are located. Orbits: Negative. No traumatic or inflammatory finding. Sinuses: Chronic mucosal disease noted in the right ethmoid air cells. Remaining paranasal sinuses and mastoid air cells are clear. Soft tissues: Negative. IMPRESSION: 1. No acute intracranial abnormality. 2. Minimally displaced bilateral nasal bone fracture. 3. Chronic mucosal disease in the right ethmoid air cells. 4. Postsurgical changes in the anterior walls of the maxillary sinuses and mandible. Electronically Signed   By: Camellia Candle M.D.   On: 04/25/2024 05:34     Procedures   Medications Ordered in the ED - No data to display                                  Medical Decision Making Amount and/or Complexity of Data Reviewed Radiology: ordered.   Presents to the emergency department for evaluation after syncopal episode.  Patient had a syncopal episode after having a bowel movement.  He reports that he fell forward off the toilet.  Patient reports that he has had probably more than 40 episodes of syncope in his life, has been worked up by cardiology.  EKG  unremarkable.  Does not require any further workup for the syncope.  Patient with facial injuries.  CT head and maxillofacial bones shows minimally displaced bilateral nasal fractures but no other injury.  No septal hematoma on exam.  Patient with superficial laceration to the bridge of the nose, no repair necessary.  Tetanus is up-to-date.  Will empirically cover mouth injury with antibiotics, no repair necessary.  No dental trauma.     Final diagnoses:  Closed fracture of nasal bone, initial encounter  Vasovagal syncope  Laceration of internal mouth, initial encounter  ED Discharge Orders          Ordered    amoxicillin  (AMOXIL ) 500 MG capsule  3 times daily        04/25/24 0552    ondansetron  (ZOFRAN -ODT) 4 MG disintegrating tablet        04/25/24 0552               Leontine Radman J, MD 04/25/24 [phone]

## 2024-04-25 NOTE — ED Triage Notes (Signed)
 Took 4mg  of Zofran  prior to coming to ED.

## 2024-05-10 ENCOUNTER — Other Ambulatory Visit: Payer: Self-pay | Admitting: Internal Medicine

## 2024-05-10 DIAGNOSIS — J4530 Mild persistent asthma, uncomplicated: Secondary | ICD-10-CM

## 2024-05-13 ENCOUNTER — Telehealth: Admitting: Family Medicine

## 2024-05-13 DIAGNOSIS — U071 COVID-19: Secondary | ICD-10-CM | POA: Diagnosis not present

## 2024-05-13 MED ORDER — NIRMATRELVIR/RITONAVIR (PAXLOVID)TABLET: 3.0000 | ORAL_TABLET | Freq: Two times a day (BID) | ORAL | 0 refills | Status: AC

## 2024-05-13 MED ORDER — FLUTICASONE PROPIONATE HFA 110 MCG/ACT IN AERO: 2.0000 | INHALATION_SPRAY | Freq: Every day | RESPIRATORY_TRACT | 1 refills | Status: AC

## 2024-05-13 MED ORDER — PREDNISONE 20 MG PO TABS: 20.0000 mg | ORAL_TABLET | Freq: Two times a day (BID) | ORAL | 0 refills | Status: AC

## 2024-05-13 NOTE — Patient Instructions (Signed)
 COVID-19: What to Know COVID-19 is an infection caused by a virus called SARS-CoV-2. This type of virus is called a coronavirus. People with COVID-19 may: Have few to no symptoms. Have mild to moderate symptoms that affect their lungs and breathing. Get very sick. What are the causes?  COVID-19 is caused by a virus. This virus may be in the air as droplets or on surfaces. It can spread from an infected person when they cough, sneeze, speak, sing, or breathe. You may become infected if: You breathe in the infected droplets in the air. You touch an object that has the virus on it. What increases the risk? You are at risk of getting COVID-19 if you have been around someone with the infection. You may be more likely to get very sick if: You are 55 years old or older. You have certain medical conditions, such as: Heart disease. Diabetes. Long-term respiratory disease. Cancer. Pregnancy. You are immunocompromised. This means your body can't fight infections easily. You have a disability that makes it hard for you to move around, you have trouble moving, or you can't move at all. What are the signs or symptoms? People may have different symptoms from COVID-19. The symptoms can also be mild to very bad. They often show up in 5-6 days after being infected. But, they can take up to 14 days to appear. Common symptoms are: Cough. Feeling tired. New loss of taste or smell. Fever. Less common symptoms are: Sore throat. Headache. Body or muscle aches. Diarrhea. A skin rash or fingers or toes that are a different color than usual. Red or irritated eyes. Sometimes, COVID-19 does not cause symptoms. How is this diagnosed? COVID-19 can be diagnosed with tests done in the lab or at home. Fluid from your nose, mouth, or lungs will be used to check for the virus. How is this treated? Treatment for COVID-19 depends on how sick you are. Mild symptoms can be treated at home with rest, fluids, and  over-the-counter medicines. very bad symptoms may be treated in a hospital intensive care unit (ICU). If you have symptoms and are at risk of getting very sick, you may be given a medicine that fights viruses. This medicine is called an antiviral. How is this prevented? To protect yourself from COVID-19: Know your risk factors. Get vaccinated. If your body can't fight infections easily, talk to your provider about treatment to help prevent COVID-19. Stay at least about 3 feet (1 meter) away from other people. Wear mask that fits well when: You can't stay at a distance from people. You're in a place with not a lot of air flow. Try to be in open spaces with good air flow when you are in public. Wash your hands often or use an alcohol-based hand sanitizer. Cover your nose and mouth when you cough or sneeze. If you think you have COVID-19 or have been around someone who has it, stay home and away from other people as told by your provider or health officials. Where to find more information To learn more: Go to [employer] Click Health Topics. Type COVID-19 in the search box. Go to VisitDestination.com.br Click Health Topics. Then click All Topics. Type COVID-19 in the search box. Get help right away if: You have trouble breathing or get short of breath. You have pain or pressure in your chest. You're feeling confused. These symptoms may be an emergency. Get help right away. Call 911. Do not wait to see if the symptoms will go away.  Do not drive yourself to the hospital. This information is not intended to replace advice given to you by your health care provider. Make sure you discuss any questions you have with your health care provider. Document Revised: 03/04/2023 Document Reviewed: 02/24/2023 Elsevier Patient Education  2025 [employer].

## 2024-05-13 NOTE — Progress Notes (Signed)
 Virtual Visit Consent   Derek Tucker, you are scheduled for a virtual visit with a [facility] provider today. Just as with appointments in the office, your consent must be obtained to participate. Your consent will be active for this visit and any virtual visit you may have with one of our providers in the next 365 days. If you have a MyChart account, a copy of this consent can be sent to you electronically.  As this is a virtual visit, video technology does not allow for your provider to perform a traditional examination. This may limit your provider's ability to fully assess your condition. If your provider identifies any concerns that need to be evaluated in person or the need to arrange testing (such as labs, EKG, etc.), we will make arrangements to do so. Although advances in technology are sophisticated, we cannot ensure that it will always work on either your end or our end. If the connection with a video visit is poor, the visit may have to be switched to a telephone visit. With either a video or telephone visit, we are not always able to ensure that we have a secure connection.  By engaging in this virtual visit, you consent to the provision of healthcare and authorize for your insurance to be billed (if applicable) for the services provided during this visit. Depending on your insurance coverage, you may receive a charge related to this service.  I need to obtain your verbal consent now. Are you willing to proceed with your visit today? Derek Tucker has provided verbal consent on 05/13/2024 for a virtual visit (video or telephone). Loa Lamp, FNP  Date: 05/13/2024 2:24 PM   Virtual Visit via Video Note   I, Loa Lamp, connected with  Derek Tucker  ([id number], 08-18-1970) on 05/13/24 at  2:15 PM EST by a video-enabled telemedicine application and verified that I am speaking with the correct person using two identifiers.  Location: Patient: Virtual Visit  Location Patient: Home Provider: Virtual Visit Location Provider: Home Office   I discussed the limitations of evaluation and management by telemedicine and the availability of in person appointments. The patient expressed understanding and agreed to proceed.    History of Present Illness: Derek Tucker is a 53 y.o. who identifies as a male who was assigned male at birth, and is being seen today for sx for 1 day, covid test positive, history of asthma. Hot, sweating, nausea, fatigue, chest wheezing and tightness, fever, chills. No sob, headache. Sx worsening.  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   Moderate episode of recurrent major depressive disorder (HCC) 11/27/2019   Essential hypertension 03/08/2018   Migraine without aura and without status migrainosus, not intractable 09/05/2015   Gastroesophageal reflux disease with esophagitis 04/15/2015   Routine general medical examination at a health care facility 03/28/2014   Asthma, mild persistent 03/03/2013   Allergic rhinitis 03/03/2013    Allergies:  Allergies  Allergen Reactions   Dulera [Mometasone  Furo-Formoterol  Fum] Other (See Comments)    headache   Imitrex  [Sumatriptan ] Other (See Comments)    Chest pain and  numbness   Codeine Other (See Comments)   Prilosec [Omeprazole ] Other (See Comments)    Headache   Shellfish Allergy Rash    GI intolerance   Medications:  Current Outpatient Medications:    fluticasone  (FLOVENT  HFA) 110 MCG/ACT inhaler, Inhale 2 puffs into the lungs daily., Disp: 1 each, Rfl: 1   nirmatrelvir/ritonavir  (PAXLOVID) 20 x 150 MG & 10 x 100MG  TABS, Take 3 tablets by mouth 2 (two) times daily for 5 days. (Take nirmatrelvir 150 mg two tablets twice daily for 5 days and ritonavir 100 mg one tablet twice daily for 5 days) Patient GFR is normal, Disp: 30 tablet, Rfl: 0   predniSONE (DELTASONE) 20 MG tablet, Take 1 tablet (20 mg total) by mouth 2 (two) times daily with a meal for 5 days., Disp: 10 tablet, Rfl: 0   albuterol  (VENTOLIN  HFA) 108 (90 Base) MCG/ACT inhaler, INHALE 2 PUFFS BY MOUTH EVERY 6 HOURS AS NEEDED FOR WHEEZING OR  SHORTNESS  OF  BREATH., Disp: 18 g, Rfl: 0   amoxicillin  (AMOXIL ) 500 MG capsule, Take 1 capsule (500 mg total) by mouth 3 (three) times daily., Disp: 21 capsule, Rfl: 0   cyanocobalamin  (VITAMIN B12) 1000 MCG/ML injection, INJECT 1 ML (1000 MCG TOTAL) INTRAMUSCULARLY ONCE EVERY 30 DAYS, Disp: 1 mL, Rfl: 0   EPINEPHrine 0.3 mg/0.3 mL IJ SOAJ injection, , Disp: , Rfl:    fish oil-omega-3 fatty acids 1000 MG capsule, Take 2 g by mouth daily., Disp: , Rfl:    fluticasone  (FLONASE ) 50 MCG/ACT nasal spray, Place 4 sprays into the nose daily., Disp: 16 g, Rfl: 11   ondansetron  (ZOFRAN -ODT) 4 MG disintegrating tablet, 4mg  ODT q4 hours prn nausea/vomit, Disp: 10 tablet, Rfl: 0   pantoprazole  (PROTONIX ) 40 MG tablet, Take 1 tablet (40 mg total) by mouth 2 (two) times daily. Take 1 tablet by mouth twice daily for 2 weeks, then reduce to 1 tablet daily for 2 weeks, then reduce to 20 mg daily for 1 week, then as needed only, Disp: 90 tablet, Rfl: 0   pramipexole  (MIRAPEX ) 0.25 MG tablet, Take 0.25 mg by mouth daily., Disp: , Rfl:    pramipexole  (MIRAPEX ) 1 MG tablet, TAKE 1 TABLET BY MOUTH AT BEDTIME, Disp: 90 tablet, Rfl: 1   Spacer/Aero Chamber Mouthpiece MISC, 1 Act by Does not apply route 2 (two) times daily., Disp: 1 each, Rfl: 11   SPRAVATO, 84 MG DOSE, 28 MG/DEVICE SOPK, Place 1 spray into both nostrils once a week., Disp: , Rfl:    SYRINGE-NEEDLE, DISP, 3 ML (B-D SYRINGE/NEEDLE  3CC/23GX1) 23G X 1 3 ML MISC, Use to inject B12 injection monthly., Disp: 50 each, Rfl: 0   UBRELVY  100 MG TABS, TAKE 1 TABLET BY MOUTH ONCE DAILY AS NEEDED, Disp: 16 tablet, Rfl: 0  Observations/Objective: Patient is well-developed, well-nourished in no acute distress.  Resting comfortably  at home.  Head is normocephalic, atraumatic.  No labored breathing.  Speech is clear and coherent with logical content.  Patient is alert and oriented at baseline.    Assessment and Plan: 1. COVID (Primary)  Increase fluids, humidifier at night, UC as needed.   Follow Up Instructions: I discussed the assessment and treatment plan with the patient. The patient was provided an opportunity to ask questions and all were answered. The patient agreed with the plan and demonstrated an understanding of the instructions.  A copy of instructions were sent to the patient via MyChart unless otherwise noted below.  The patient was advised to call back or seek an in-person evaluation if the symptoms worsen or if the condition fails to improve as anticipated.    Khylon Davies, FNP

## 2024-05-29 ENCOUNTER — Other Ambulatory Visit (HOSPITAL_COMMUNITY): Payer: Self-pay

## 2024-05-29 ENCOUNTER — Telehealth: Payer: Self-pay

## 2024-05-29 NOTE — Telephone Encounter (Signed)
 Pharmacy Patient Advocate Encounter   Received notification from Onbase that prior authorization for Ubrelvy  100 is required/requested.   Insurance verification completed.   The patient is insured through [employer].   Per test claim: PA required; PA submitted to above mentioned insurance via Latent Key/confirmation #/EOC [id number] Status is pending

## 2024-05-30 ENCOUNTER — Other Ambulatory Visit (HOSPITAL_COMMUNITY): Payer: Self-pay

## 2024-05-30 NOTE — Telephone Encounter (Signed)
 Pharmacy Patient Advocate Encounter  Received notification from [employer] that Prior Authorization for Ubrelvy  100 has been APPROVED from 05/29/24 to 05/29/25. Ran test claim, Copay is $0.00. This test claim was processed through [facility]- copay amounts may vary at other pharmacies due to pharmacy/plan contracts, or as the patient moves through the different stages of their insurance plan.   PA #/Case ID/Reference #: # [id number]

## 2024-05-31 NOTE — Telephone Encounter (Signed)
 Patient has been made aware via voice message on his phone in regards to his medication being approved.

## 2024-06-26 ENCOUNTER — Ambulatory Visit: Admitting: Gastroenterology

## 2024-06-28 ENCOUNTER — Other Ambulatory Visit: Payer: Self-pay | Admitting: Internal Medicine

## 2024-06-28 DIAGNOSIS — G43009 Migraine without aura, not intractable, without status migrainosus: Secondary | ICD-10-CM

## 2024-06-30 ENCOUNTER — Other Ambulatory Visit: Payer: Self-pay | Admitting: Internal Medicine

## 2024-06-30 DIAGNOSIS — G43009 Migraine without aura, not intractable, without status migrainosus: Secondary | ICD-10-CM

## 2024-07-07 ENCOUNTER — Other Ambulatory Visit: Payer: Self-pay | Admitting: Medical Genetics

## 2024-07-07 DIAGNOSIS — Z006 Encounter for examination for normal comparison and control in clinical research program: Secondary | ICD-10-CM
